# Patient Record
Sex: Male | Born: 1960
Health system: Southern US, Community
[De-identification: ages and names within clinical notes are randomized; demographics above are authoritative.]

## PROBLEM LIST (undated history)

## (undated) DIAGNOSIS — Z87442 Personal history of urinary calculi: Secondary | ICD-10-CM

## (undated) DIAGNOSIS — Z8619 Personal history of other infectious and parasitic diseases: Secondary | ICD-10-CM

## (undated) DIAGNOSIS — E785 Hyperlipidemia, unspecified: Secondary | ICD-10-CM

## (undated) DIAGNOSIS — I2510 Atherosclerotic heart disease of native coronary artery without angina pectoris: Secondary | ICD-10-CM

## (undated) DIAGNOSIS — I6523 Occlusion and stenosis of bilateral carotid arteries: Secondary | ICD-10-CM

## (undated) DIAGNOSIS — Z7289 Other problems related to lifestyle: Secondary | ICD-10-CM

## (undated) DIAGNOSIS — U071 COVID-19: Secondary | ICD-10-CM

## (undated) DIAGNOSIS — K760 Fatty (change of) liver, not elsewhere classified: Secondary | ICD-10-CM

## (undated) DIAGNOSIS — I5189 Other ill-defined heart diseases: Secondary | ICD-10-CM

## (undated) DIAGNOSIS — I340 Nonrheumatic mitral (valve) insufficiency: Secondary | ICD-10-CM

## (undated) DIAGNOSIS — M1990 Unspecified osteoarthritis, unspecified site: Secondary | ICD-10-CM

## (undated) DIAGNOSIS — R7303 Prediabetes: Secondary | ICD-10-CM

## (undated) DIAGNOSIS — I739 Peripheral vascular disease, unspecified: Secondary | ICD-10-CM

## (undated) DIAGNOSIS — K219 Gastro-esophageal reflux disease without esophagitis: Secondary | ICD-10-CM

## (undated) DIAGNOSIS — I8390 Asymptomatic varicose veins of unspecified lower extremity: Secondary | ICD-10-CM

## (undated) DIAGNOSIS — I071 Rheumatic tricuspid insufficiency: Secondary | ICD-10-CM

## (undated) DIAGNOSIS — E669 Obesity, unspecified: Secondary | ICD-10-CM

## (undated) DIAGNOSIS — M5412 Radiculopathy, cervical region: Secondary | ICD-10-CM

## (undated) DIAGNOSIS — I10 Essential (primary) hypertension: Secondary | ICD-10-CM

## (undated) DIAGNOSIS — F5104 Psychophysiologic insomnia: Secondary | ICD-10-CM

## (undated) DIAGNOSIS — Z789 Other specified health status: Secondary | ICD-10-CM

## (undated) HISTORY — DX: Other specified health status: Z78.9

## (undated) HISTORY — PX: SCLEROTHERAPY: SHX6841

## (undated) HISTORY — DX: Atherosclerotic heart disease of native coronary artery without angina pectoris: I25.10

## (undated) HISTORY — DX: Obesity, unspecified: E66.9

## (undated) HISTORY — PX: KNEE ARTHROSCOPY: SUR90

## (undated) HISTORY — DX: Hyperlipidemia, unspecified: E78.5

## (undated) HISTORY — DX: Gastro-esophageal reflux disease without esophagitis: K21.9

## (undated) HISTORY — DX: Personal history of other infectious and parasitic diseases: Z86.19

## (undated) HISTORY — DX: Essential (primary) hypertension: I10

## (undated) HISTORY — DX: Other problems related to lifestyle: Z72.89

---

## 2009-11-20 ENCOUNTER — Emergency Department: Payer: Self-pay | Admitting: Internal Medicine

## 2010-08-07 ENCOUNTER — Ambulatory Visit: Payer: Self-pay | Admitting: Family Medicine

## 2010-08-26 HISTORY — PX: COLONOSCOPY: SHX174

## 2010-08-26 HISTORY — PX: ANTERIOR CERVICAL DECOMP/DISCECTOMY FUSION: SHX1161

## 2011-08-27 HISTORY — PX: CARDIAC CATHETERIZATION: SHX172

## 2012-01-04 ENCOUNTER — Inpatient Hospital Stay: Payer: Self-pay | Admitting: Internal Medicine

## 2012-01-04 LAB — CK-MB: CK-MB: 0.6 ng/mL (ref 0.5–3.6)

## 2012-01-04 LAB — CBC
MCH: 32.7 pg (ref 26.0–34.0)
MCV: 91 fL (ref 80–100)
Platelet: 236 10*3/uL (ref 150–440)
RDW: 12.7 % (ref 11.5–14.5)
WBC: 5.8 10*3/uL (ref 3.8–10.6)

## 2012-01-04 LAB — BASIC METABOLIC PANEL
Chloride: 106 mmol/L (ref 98–107)
EGFR (African American): >60
EGFR (Non-African Amer.): >60
Sodium: 138 mmol/L (ref 136–145)

## 2012-01-04 LAB — TROPONIN I: Troponin-I: <0.02 ng/mL

## 2012-01-05 LAB — CK-MB: CK-MB: 0.6 ng/mL (ref 0.5–3.6)

## 2012-01-05 LAB — BASIC METABOLIC PANEL
Calcium, Total: 8.3 mg/dL — ABNORMAL LOW (ref 8.5–10.1)
Co2: 27 mmol/L (ref 21–32)
Creatinine: 0.58 mg/dL — ABNORMAL LOW (ref 0.60–1.30)
EGFR (African American): >60
Glucose: 86 mg/dL (ref 65–99)
Potassium: 3.7 mmol/L (ref 3.5–5.1)

## 2012-01-05 LAB — LIPID PANEL
Cholesterol: 206 mg/dL — ABNORMAL HIGH (ref 0–200)
Triglycerides: 258 mg/dL — ABNORMAL HIGH (ref 0–200)
VLDL Cholesterol, Calc: 52 mg/dL — ABNORMAL HIGH (ref 5–40)

## 2012-01-05 LAB — HEMOGLOBIN A1C: Hemoglobin A1C: 5.8 % (ref 4.2–6.3)

## 2012-01-06 LAB — BASIC METABOLIC PANEL
BUN: 11 mg/dL (ref 7–18)
Chloride: 107 mmol/L (ref 98–107)
Co2: 27 mmol/L (ref 21–32)
Creatinine: 0.63 mg/dL (ref 0.60–1.30)
Glucose: 94 mg/dL (ref 65–99)
Potassium: 4.2 mmol/L (ref 3.5–5.1)
Sodium: 140 mmol/L (ref 136–145)

## 2012-01-07 ENCOUNTER — Emergency Department: Payer: Self-pay | Admitting: Emergency Medicine

## 2012-12-28 ENCOUNTER — Other Ambulatory Visit: Payer: Self-pay | Admitting: Neurosurgery

## 2012-12-28 DIAGNOSIS — M542 Cervicalgia: Secondary | ICD-10-CM

## 2012-12-28 DIAGNOSIS — M5412 Radiculopathy, cervical region: Secondary | ICD-10-CM

## 2013-01-01 ENCOUNTER — Ambulatory Visit
Admission: RE | Admit: 2013-01-01 | Discharge: 2013-01-01 | Disposition: A | Discharge status: Home / Self Care | Payer: 59 | Source: Ambulatory Visit | Attending: Neurosurgery | Admitting: Neurosurgery

## 2013-01-01 DIAGNOSIS — M5412 Radiculopathy, cervical region: Secondary | ICD-10-CM

## 2013-01-01 DIAGNOSIS — M542 Cervicalgia: Secondary | ICD-10-CM

## 2013-01-13 ENCOUNTER — Other Ambulatory Visit: Payer: Self-pay

## 2013-02-25 ENCOUNTER — Other Ambulatory Visit: Payer: Self-pay | Admitting: Neurosurgery

## 2013-02-25 DIAGNOSIS — M5412 Radiculopathy, cervical region: Secondary | ICD-10-CM

## 2013-03-04 ENCOUNTER — Ambulatory Visit
Admission: RE | Admit: 2013-03-04 | Discharge: 2013-03-04 | Disposition: A | Discharge status: Home / Self Care | Payer: 59 | Source: Ambulatory Visit | Attending: Neurosurgery | Admitting: Neurosurgery

## 2013-03-04 VITALS — BP 150/80 | HR 64 | Ht 66.0 in | Wt 250.0 lb

## 2013-03-04 DIAGNOSIS — M5412 Radiculopathy, cervical region: Secondary | ICD-10-CM

## 2013-03-04 MED ORDER — ONDANSETRON HCL 4 MG/2ML IJ SOLN
4.0000 mg | Freq: Once | INTRAMUSCULAR | Status: AC
  Administered 2013-03-04: 4 mg via INTRAMUSCULAR

## 2013-03-04 MED ORDER — MEPERIDINE HCL 100 MG/ML IJ SOLN
100.0000 mg | Freq: Once | INTRAMUSCULAR | Status: AC
  Administered 2013-03-04: 100 mg via INTRAMUSCULAR

## 2013-03-04 MED ORDER — IOHEXOL 300 MG/ML  SOLN
10.0000 mL | Freq: Once | INTRAMUSCULAR | Status: AC | PRN
  Administered 2013-03-04: 10 mL via INTRATHECAL

## 2013-03-04 MED ORDER — DIAZEPAM 5 MG PO TABS
10.0000 mg | ORAL_TABLET | Freq: Once | ORAL | Status: AC
  Administered 2013-03-04: 10 mg via ORAL

## 2013-03-04 NOTE — Progress Notes (Signed)
Explained discharge instructions to pt.  

## 2013-08-26 HISTORY — PX: CT CTA CORONARY W/CA SCORE W/CM &/OR WO/CM: HXRAD787

## 2013-11-29 ENCOUNTER — Emergency Department: Payer: Self-pay | Admitting: Emergency Medicine

## 2013-11-29 LAB — BASIC METABOLIC PANEL
Anion Gap: 5 — ABNORMAL LOW (ref 7–16)
BUN: 11 mg/dL (ref 7–18)
CALCIUM: 8.5 mg/dL (ref 8.5–10.1)
CHLORIDE: 103 mmol/L (ref 98–107)
CO2: 27 mmol/L (ref 21–32)
Creatinine: 0.91 mg/dL (ref 0.60–1.30)
EGFR (African American): >60
Glucose: 132 mg/dL — ABNORMAL HIGH (ref 65–99)
Osmolality: 271 (ref 275–301)
POTASSIUM: 3.6 mmol/L (ref 3.5–5.1)
SODIUM: 135 mmol/L — AB (ref 136–145)

## 2013-11-29 LAB — D-DIMER(ARMC): D-DIMER: 210 ng/mL

## 2013-11-29 LAB — CBC
HCT: 44.1 % (ref 40.0–52.0)
HGB: 15.2 g/dL (ref 13.0–18.0)
MCH: 31.9 pg (ref 26.0–34.0)
MCHC: 34.4 g/dL (ref 32.0–36.0)
MCV: 93 fL (ref 80–100)
PLATELETS: 230 10*3/uL (ref 150–440)
RBC: 4.76 10*6/uL (ref 4.40–5.90)
RDW: 12.8 % (ref 11.5–14.5)
WBC: 6.4 10*3/uL (ref 3.8–10.6)

## 2013-11-29 LAB — TROPONIN I

## 2013-11-30 LAB — CBC
HEMOGLOBIN: 15.1 g/dL
PLATELET COUNT: 13.3
WBC: 7.1

## 2013-11-30 LAB — BILIRUBIN, TOTAL: CREATININE: 0.75

## 2014-03-18 ENCOUNTER — Ambulatory Visit: Payer: Self-pay | Admitting: Specialist

## 2014-05-13 ENCOUNTER — Ambulatory Visit: Payer: Self-pay | Admitting: Gastroenterology

## 2014-05-15 ENCOUNTER — Other Ambulatory Visit: Payer: Self-pay | Admitting: Gastroenterology

## 2014-05-15 LAB — CLOSTRIDIUM DIFFICILE(ARMC)

## 2014-05-16 LAB — PATHOLOGY REPORT

## 2014-05-17 LAB — STOOL CULTURE

## 2014-10-07 LAB — HEPATIC FUNCTION PANEL
ALBUMIN: 4.5
ALT: 50
AST: 26 U/L
Alkaline Phosphatase: 112 U/L
BILIRUBIN TOTAL: 0.9 mg/dL

## 2014-10-07 LAB — LIPID PANEL
CHOLESTEROL: 168
HDL: 40
LDL (calc): 102
TRIGLYCERIDES: 128

## 2014-12-18 NOTE — H&P (Signed)
PATIENT NAME:  [name] [name], [name] [name] MR#:  [id number] DATE OF BIRTH:  [birth year]  DATE OF ADMISSION:  01/04/2012  PRIMARY CARE PHYSICIAN: None.   CHIEF COMPLAINT: Chest pain.   HISTORY OF PRESENT ILLNESS: The patient is a 54 year old white male with history of reflux who states that he has been having chest pain ongoing for the past one week. He states that the pain is in the substernal area of his chest. He describes it as a constant type of pressure-like sensation associated with some intermittent burning. The patient reports that activity does make it a little worse. He also has some dyspnea on exertion. He denies any numbness or tingling. He denies any nausea or vomiting. He reports that he has not had any similar symptoms like these previously. He does have a history of reflux and he reports that he usually takes over-the-counter Prilosec and it usually helps.   PAST MEDICAL HISTORY: Gastroesophageal reflux disease.   PAST SURGICAL HISTORY: Status post C5-C6 neck surgery.   ALLERGIES: None.   MEDICATIONS: Prilosec 20 daily.   SOCIAL HISTORY: Does not smoke. Drinks occasionally. No drugs.   FAMILY HISTORY: Father with a heart attack.  REVIEW OF SYSTEMS:  CONSTITUTIONAL: Denies any fevers, fatigue, or weakness. Chest pain as above. No weight loss. No weight gain. EYES: No blurred or double vision. No pain. No redness. No inflammation. No glaucoma. No cataracts. ENT: No tinnitus. No ear pain. No hearing loss. No seasonal or year-round allergies. No epistaxis. No nasal discharge. No snoring. No postnasal drip. No sinus pain. No redness of the oropharynx. No difficulty swallowing.  RESPIRATORY: No cough. No wheezing. No hemoptysis. No chronic obstructive pulmonary disease. No tuberculosis.  CARDIOVASCULAR: Complains of chest pressure as above. No orthopnea. No edema. No arrhythmia. Complains of dyspnea on exertion and does complain of feeling lightheaded and dizzy but no syncope. GASTROINTESTINAL: No  nausea, vomiting, or diarrhea. No abdominal pain. No hematemesis. No melena. No ulcer. Does have history of gastroesophageal reflux disease. No irritable bowel syndrome. No jaundice. No rectal bleeding. No changes in bowel habits. GENITOURINARY: Denies any dysuria, hematuria, renal calculus, or frequency. ENDO: Denies any polyuria, nocturia, or thyroid problems. HEME/LYMPH: Denies anemia, easy bruisability, or bleeding. SKIN: No acne. No rash. No changes mole, hair, or skin. MUSCULOSKELETAL: Denies any pain in the neck, back, or shoulder. NEURO: No numbness. No cerebrovascular accident. No transient ischemic attack. No seizures. PSYCHIATRIC: No anxiety. No insomnia. No ADD. No OCD.   PHYSICAL EXAMINATION:  VITAL SIGNS: Temperature 96, pulse 78, respirations 18, blood pressure 206/107 on presentation, O2 100%.   GENERAL: The patient is a 54 year old white male in no acute distress.   HEENT: Head atraumatic, normocephalic. Pupils equally round, reactive to light and accommodation. Extraocular movements intact.  Oropharynx clear.  NECK: There is no JVD. No carotid bruits.   CARDIOVASCULAR: Regular rate and rhythm. No murmurs, rubs, clicks, or gallops. PMI is not displaced.   LUNGS: No accessory muscle usage. Clear to auscultation bilaterally without any rales, rhonchi, or wheezing.   ABDOMEN: Soft, nontender, nondistended. Positive bowel sounds times four.   EXTREMITIES: No clubbing, cyanosis, or edema.   SKIN: No rash.   LYMPHATICS: No lymph nodes palpable.   VASCULAR: Good DP, PT pulses.   PSYCHIATRIC: Not anxious or depressed.   NEUROLOGICAL: Awake, alert, oriented times three. No focal deficits.   LABORATORY, DIAGNOSTIC, AND RADIOLOGICAL DATA: Glucose 104, BUN 9, creatinine 0.68, sodium 138, potassium 3.6, chloride 106, CO2 25, calcium 8.5. Troponin less  than 0.02. WBC 5.8, hemoglobin 13.8, platelet count was 236. EKG showed right bundle branch block. No ST-T wave changes. Chest x-ray  showed no acute abnormality.   ASSESSMENT AND PLAN: The patient is a 54 year old white male with history of reflux who presents with ongoing substernal chest discomfort going on for one week, constant in nature. Noted to have elevated blood pressure on presentation.  1. Chest pain, possibly due to accelerated blood pressure. Due to the persistent nature of his symptoms we will go ahead and do serial cardiac enzymes. Perform a stress MIBI in the a.m. We will control his blood pressure. Also place him on Protonix b.i.d. if this could be also reflux related.  2. Accelerated blood pressure with no history of hypertension. We will go ahead and start him on metoprolol and p.r.n. hydralazine. Adjust his regimen as needed.  3. Gastroesophageal reflux disease. We will place him on Protonix b.i.d.  4. Miscellaneous. We will place him on Lovenox for deep vein thrombosis prophylaxis.   TIME SPENT: 35 minutes spent.     ____________________________ Lafonda Mosses. Posey Pronto, MD shp:bjt D: 01/04/2012 12:38:57 ET T: 01/04/2012 13:27:36 ET JOB#: [id number]  cc: Marwa Fuhrman H. Posey Pronto, MD, <Dictator> Alric Seton MD ELECTRONICALLY SIGNED 01/06/2012 14:34

## 2014-12-18 NOTE — Discharge Summary (Signed)
PATIENT NAME:  [name] [name] MR#:  [id number] DATE OF BIRTH:  19-Dec-1960  DATE OF ADMISSION:  01/04/2012 DATE OF DISCHARGE:  01/06/2012  ADMISSION DIAGNOSIS: Chest pain.   DISCHARGE DIAGNOSES:  1. Chest pain, noncardiac, status post cardiac catheterization with minor irregularites, possible gastrointestinal related.  2. Chronic gastroesophageal reflux disease over greater than 20 years. Will need outpatient evaluation and possible esophagogastroduodenoscopy for evaluation of Barrett's.  3. Hyperlipidemia with low HDL and hypertriglyceridemia. The patient was counseled regarding diet. Will need repeat fasting lipid panel in a few weeks and may need treatment.  4. Status post cervical spine surgery.  5. Accelerated hypertension.  CONSULTANT: Dr. [name].  PERTINENT LABS AND EVALUATIONS: Glucose 104, BUN 9, creatinine 0.68, sodium 138, potassium 3.6, chloride 106, CO2 25, calcium 8.5. Troponin less than 0.02 x3. WBC 5.8, hemoglobin 13.8, platelet count 236.   EKG showed right bundle branch block. No ST-T wave changes.   Chest x-ray showed no acute abnormality.   Fasting lipid panel: Total cholesterol 206, triglycerides 258, HDL 24. Hemoglobin A1c 5.8.   Cardiac catheterization showed no obstruction, official report pending.   HOSPITAL COURSE: Please see the history and physical done by me. The patient is a 54 year old white male with history of chronic reflux who presented with constant ongoing chest pain for about one week in duration. The patient also reports there is a strong family history of heart disease, especially in his father. Due to his symptoms, the patient was hospitalized. Serial cardiac enzymes were done. Cardiology evaluation was done due to the consistent nature of his symptoms. Due to the constant nature of his symptoms, it was felt that cardiac catheterization would be the best appropriate evaluation at this point. He underwent a cardiac catheterization which showed no obstructive  disease minimal cad. His symptoms could be related to GI causes. At this point, his chest pain has resolved. I recommended that he be followed up as an outpatient with gastroenterology for any further evaluations. At this time, he is stable for discharge.   DISCHARGE MEDICATIONS:  1. Prilosec 20 mg daily. 2. Atenolol 50 mg p.o. daily.   DIET: Low fat, low sodium.   ACTIVITY: As tolerated.   DISCHARGE FOLLOWUP: Followup in 1 to 2 weeks with primary MD of choice. Also referred to [facility] as a new patient for reflux. May need an EGD.   TIME SPENT: 32 minutes. ____________________________ Lafonda Mosses Posey Pronto, MD shp:slb D: 01/06/2012 11:47:24 ET T: 01/07/2012 12:31:13 ET JOB#: [id number]  cc: Kiley Solimine H. Posey Pronto, MD, <Dictator> Alric Seton MD ELECTRONICALLY SIGNED 01/09/2012 17:38

## 2014-12-18 NOTE — Consult Note (Signed)
PATIENT NAME:  [name] [name], [name] [name] MR#:  [id number] DATE OF BIRTH:  04-17-1961  DATE OF CONSULTATION:  01/05/2012  REFERRING PHYSICIAN:   CONSULTING PHYSICIAN:  Dionisio David, MD  INDICATION FOR CONSULTATION: Chest pain.   HISTORY OF PRESENT ILLNESS: This is a 54 year old white male with a past medical history of GI reflux who came into the hospital with chest pain. The chest pain was described as pressure-type associated with shortness of breath and diaphoresis and dizziness. He says he has normally some chest pain and he takes antacids for acid reflux, but this time the pain was much worse and different in character. He said this was not the pain that he gets from acid reflux; usually that is a burning-type pain.   PAST MEDICAL HISTORY: History of acid reflux.   ALLERGIES: None.   MEDICATIONS:  Prilosec 20 mg p.o. daily.   SOCIAL HISTORY: Unremarkable.   FAMILY HISTORY: His father had a heart attack at the age of 14.   PHYSICAL EXAMINATION:  GENERAL: He is alert, oriented times three, in no acute distress.   VITAL SIGNS:  Stable. Right now 140/70, but when he first came in his blood pressure was remarkably elevated at 206/107.   NECK: No JVD.   LUNGS: Clear.   HEART: Regular rate and rhythm. Normal S1, S2. No murmur.   ABDOMEN: Soft, nontender, positive bowel sounds.   EXTREMITIES: No pedal edema.   NEUROLOGIC: The patient appears to be intact.   EKG shows normal sinus rhythm, nonspecific ST-T changes, incomplete right bundle branch block. Cardiac enzymes are negative.   ASSESSMENT AND PLAN: Unstable angina with history of hypertension, hypercholesterolemia, history of having cholesterol as high as 300. His father had a massive myocardial infarction at his age, so he has multiple risk factors for coronary artery disease. Advise cardiac catheterization.   ____________________________ Dionisio David, MD sak:bjt D: 01/05/2012 09:51:15 ET T: 01/05/2012 10:23:13  ET JOB#: [id number]  cc: Dionisio David, MD, <Dictator> Dionisio David MD ELECTRONICALLY SIGNED 01/14/2012 11:03

## 2014-12-30 IMAGING — US ABDOMEN ULTRASOUND LIMITED
1 series · 14 of 25 positions shown · non-contrast
Comparison: None.

CLINICAL DATA: Hypercholesterolemia. Obesity. Preoperative
assessment.

EXAM:
US ABDOMEN LIMITED - RIGHT UPPER QUADRANT

[Series 1: abdomen ultrasound limited · 0.28mm/px · 14 of 44 slices shown]
[im 1/44]
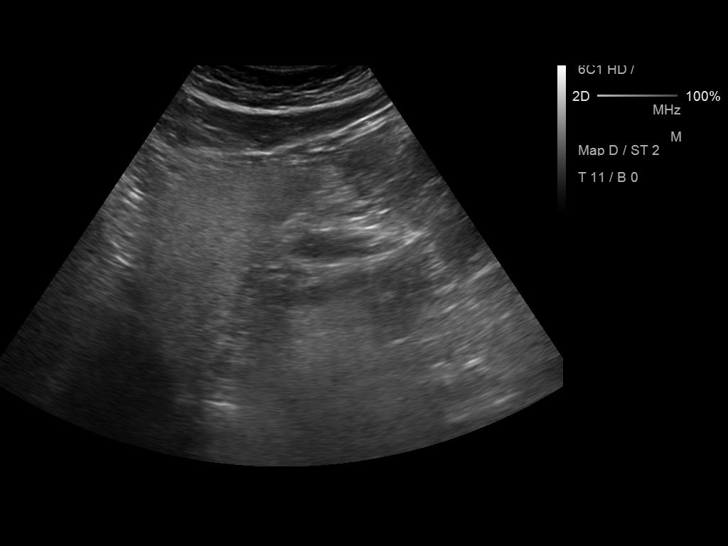
[im 4/44]
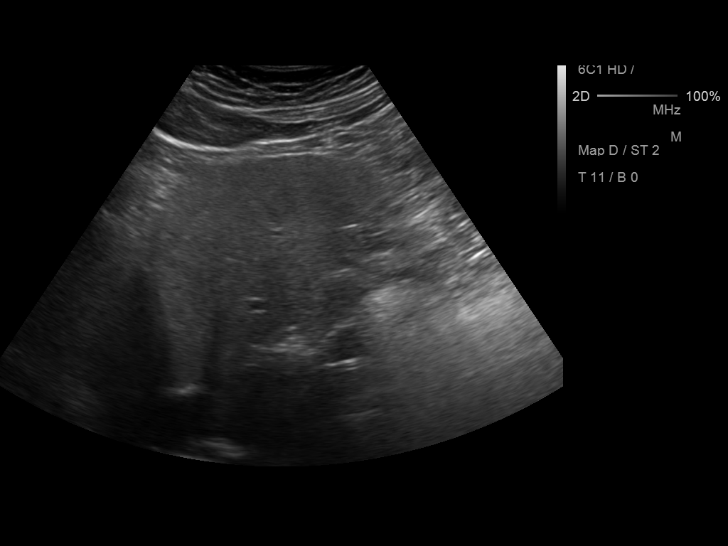
[im 8/44]
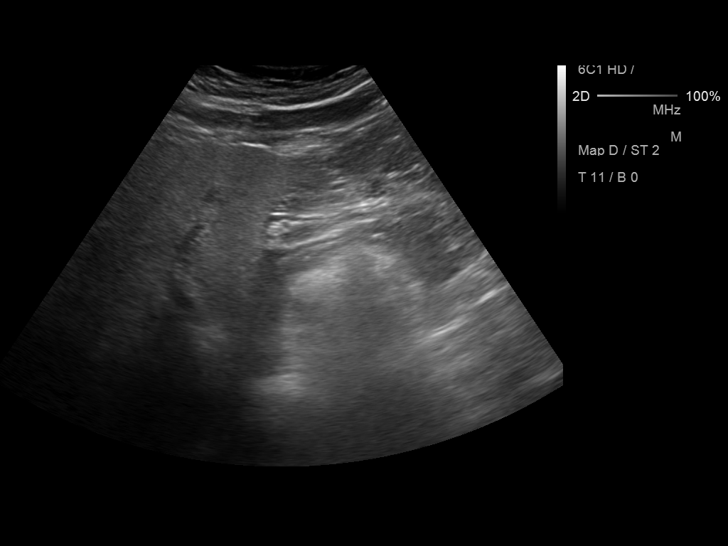
[im 11/44]
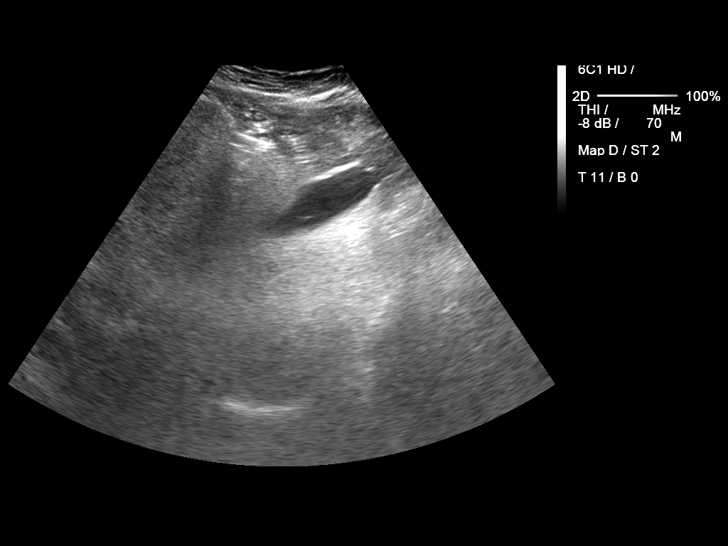
[im 15/44]
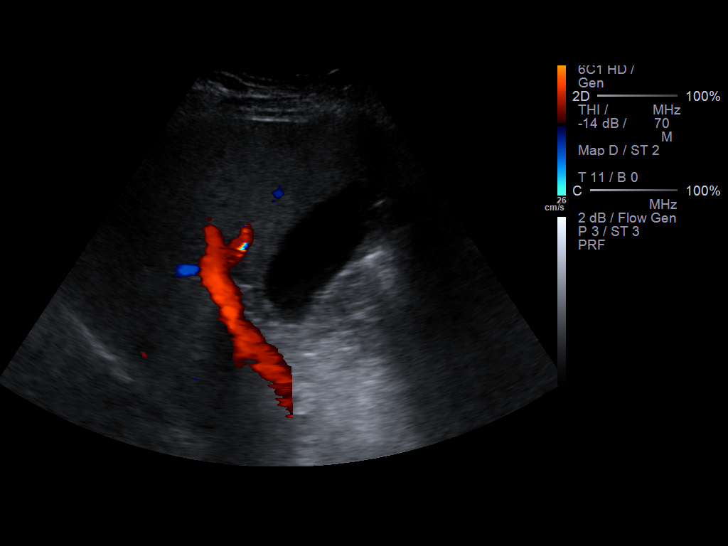
[im 17/44]
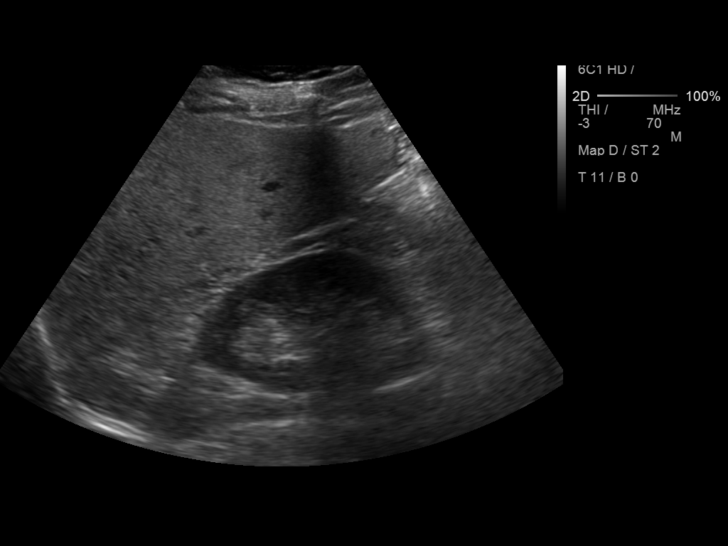
[im 20/44]
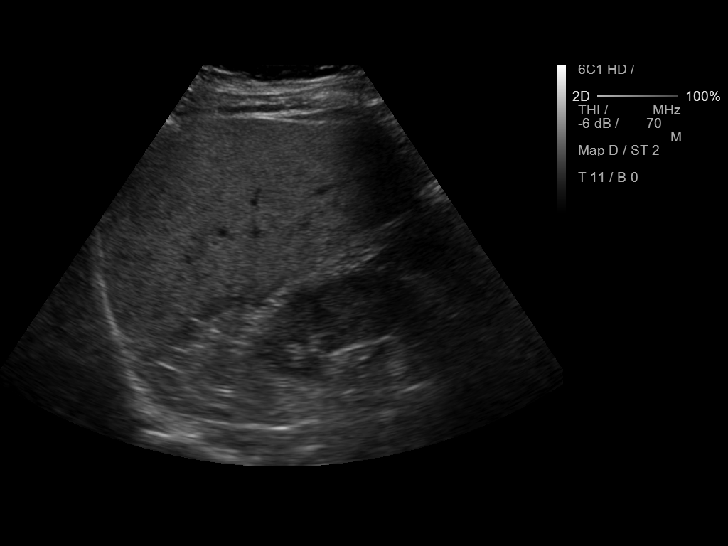
[im 24/44]
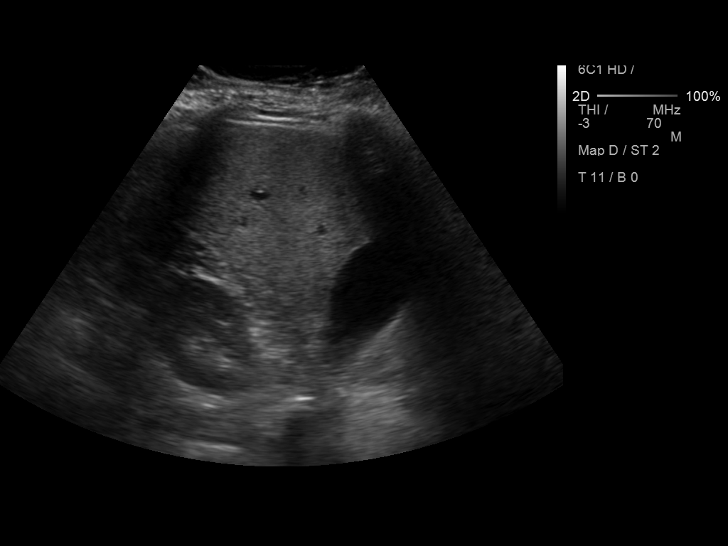
[im 27/44]
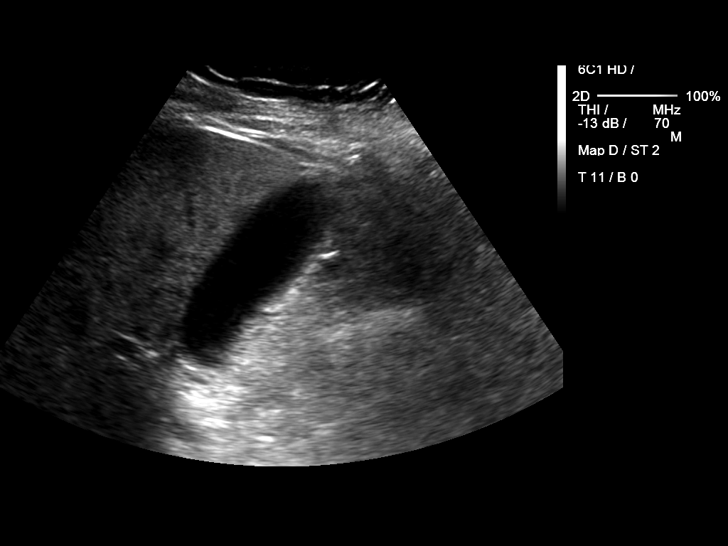
[im 29/44]
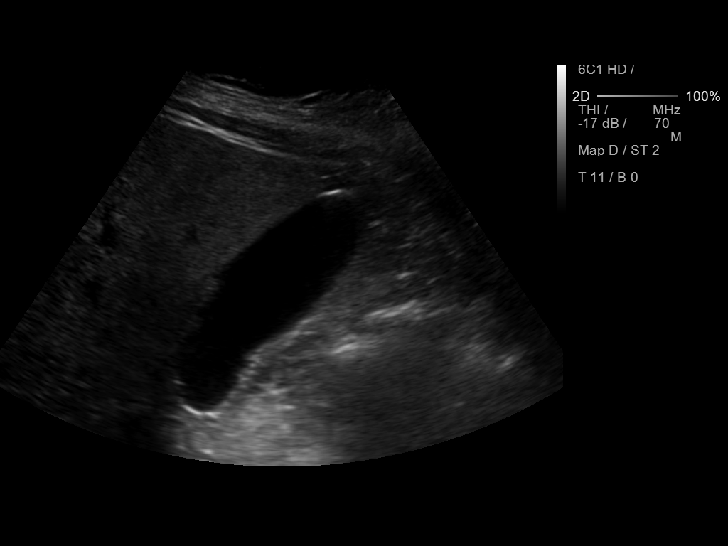
[im 33/44]
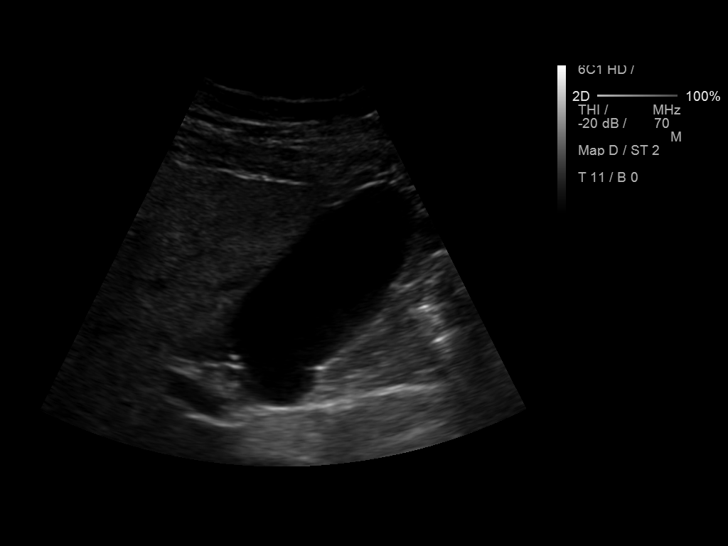
[im 36/44]
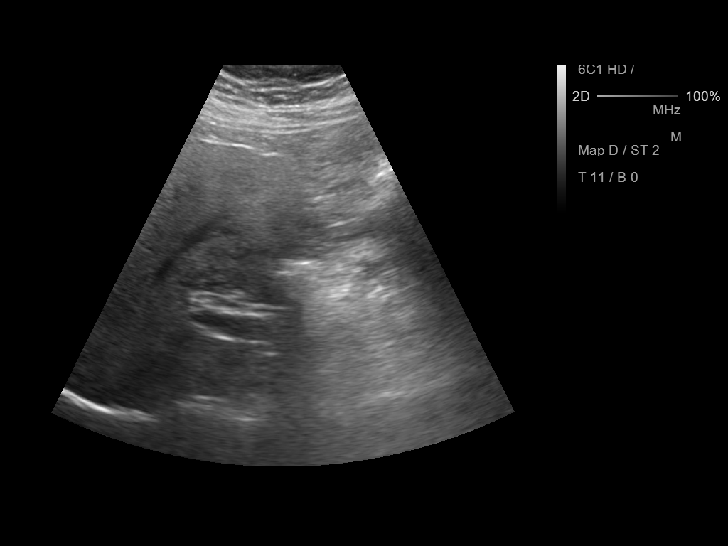
[im 40/44]
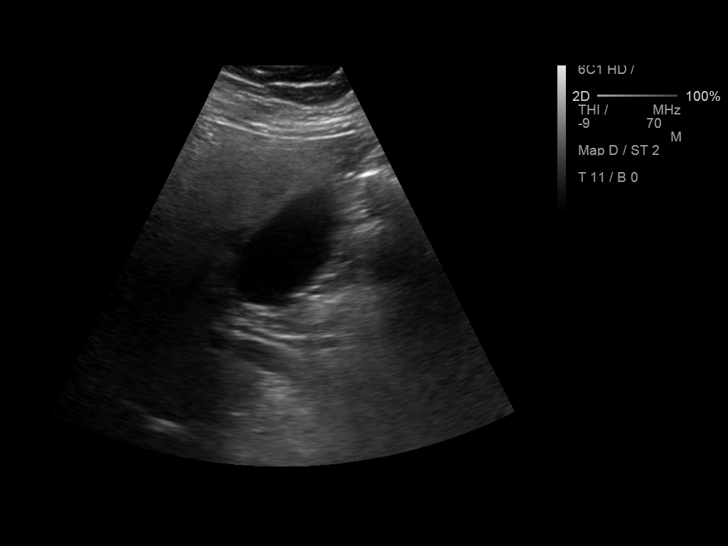
[im 44/44]
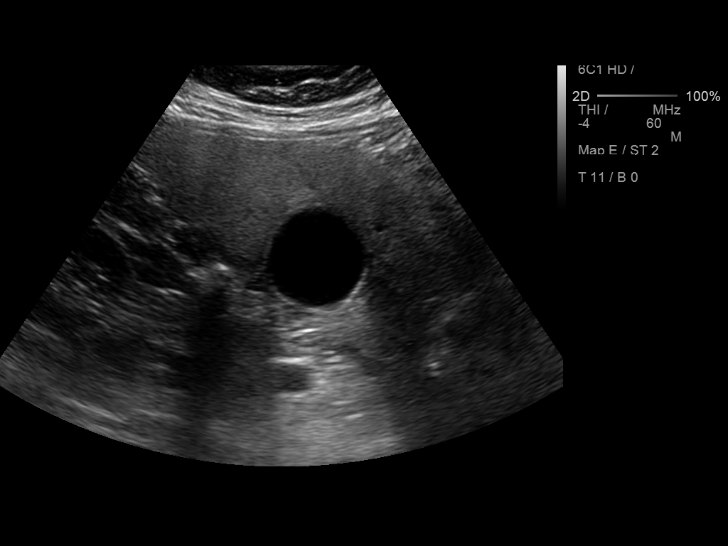

[14 of 25 positions shown; findings below may reference images not displayed]

FINDINGS: Gallbladder:

No gallstones or wall thickening visualized. No sonographic Murphy
sign noted.

Common bile duct:

Diameter: 5 mm, within normal limits for age.

Liver:

Mildly generally hyperechoic with reduced sonic penetration. No
focal lesion.
IMPRESSION: 1. Mildly diffusely hyperechoic liver, suspicious for hepatic
steatosis. Otherwise, no significant abnormalities are observed.

## 2014-12-30 IMAGING — CR DG CHEST 2V
1 series · 2 of 2 positions shown · non-contrast
Comparison: 11/29/2013.

CLINICAL DATA: Preoperative chest radiograph. Bariatric surgery.
Morbid obesity.

EXAM:
CHEST  2 VIEW

[Series 1: w chest pa · 0.14mm/px · 2 of 2 slices shown]
[im 1/2]
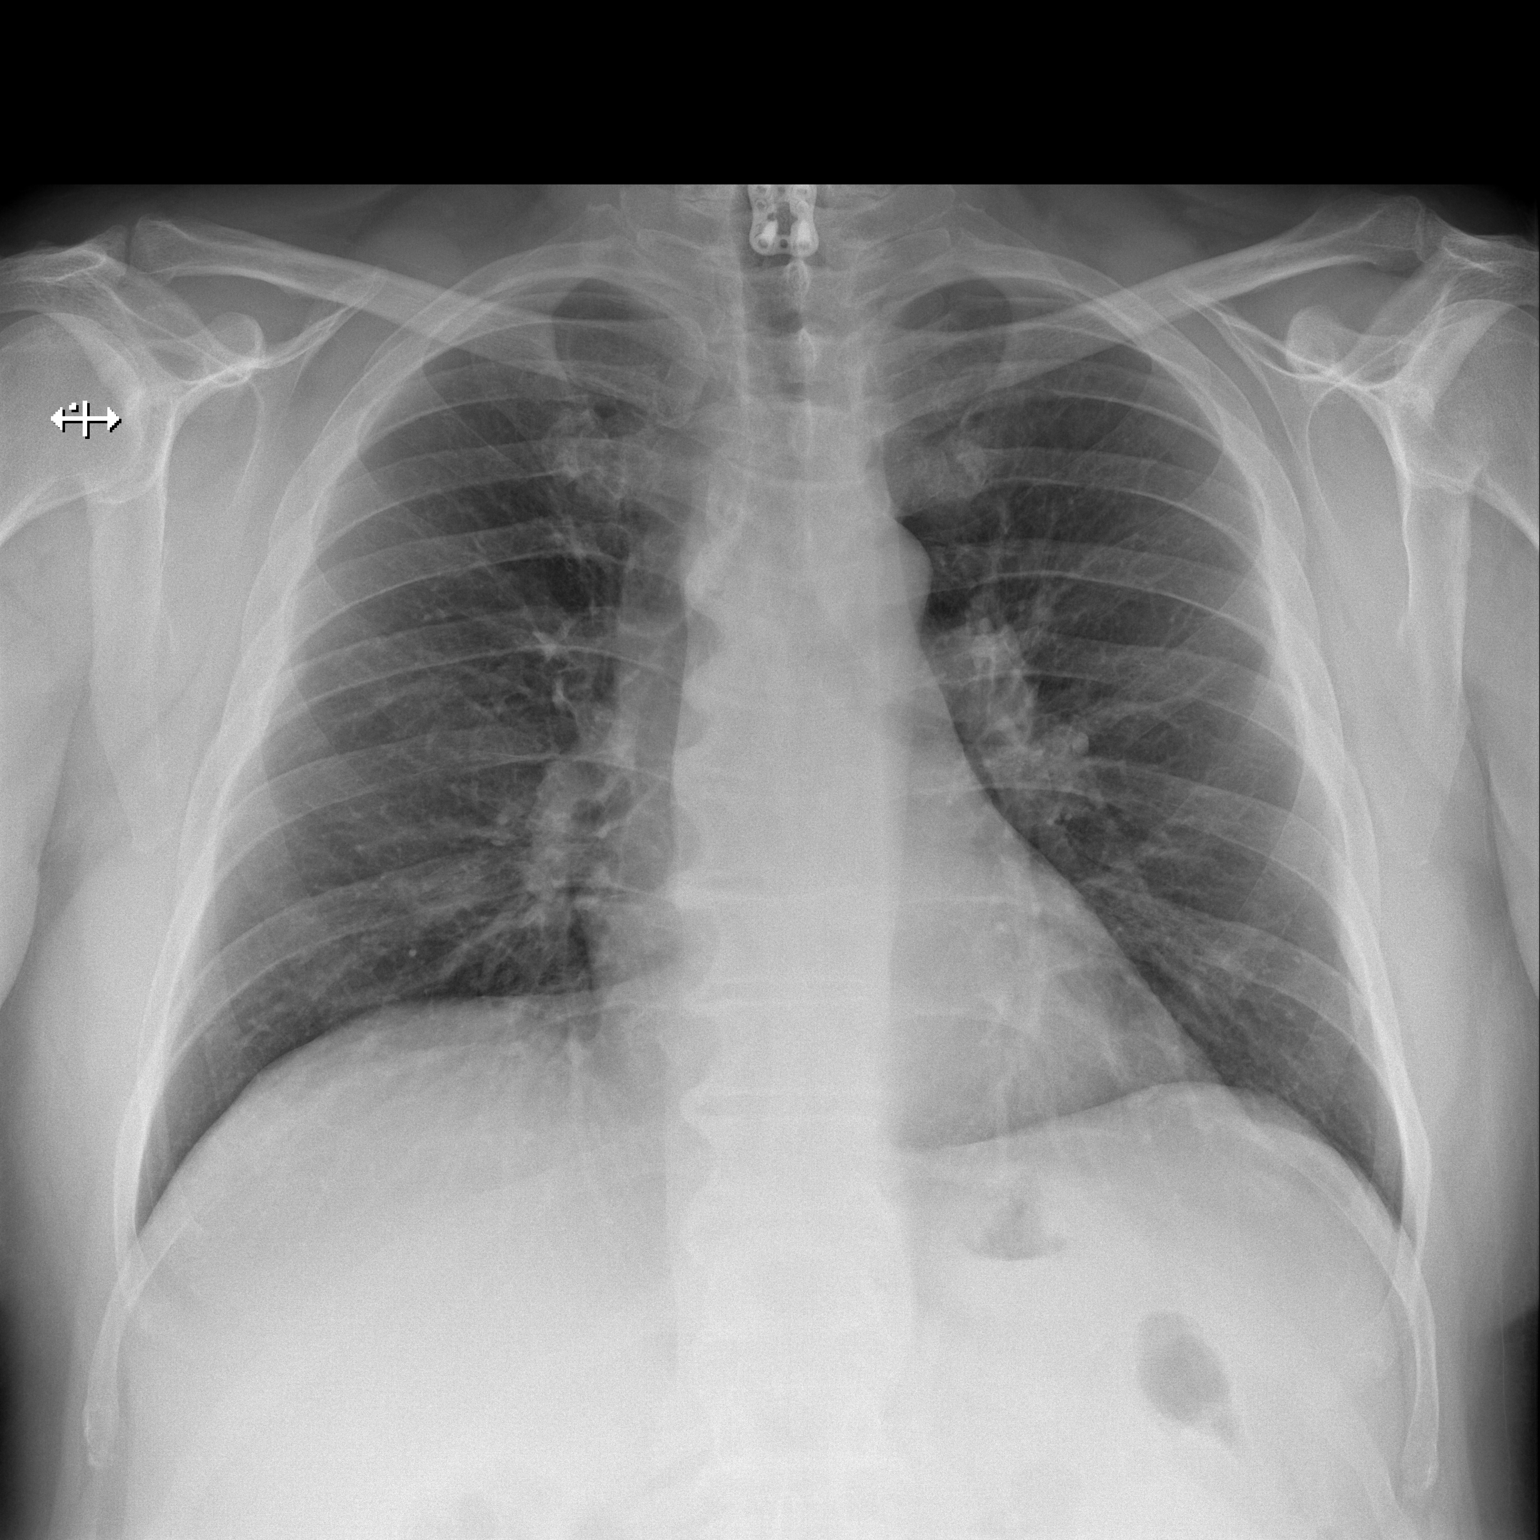
[im 2/2]
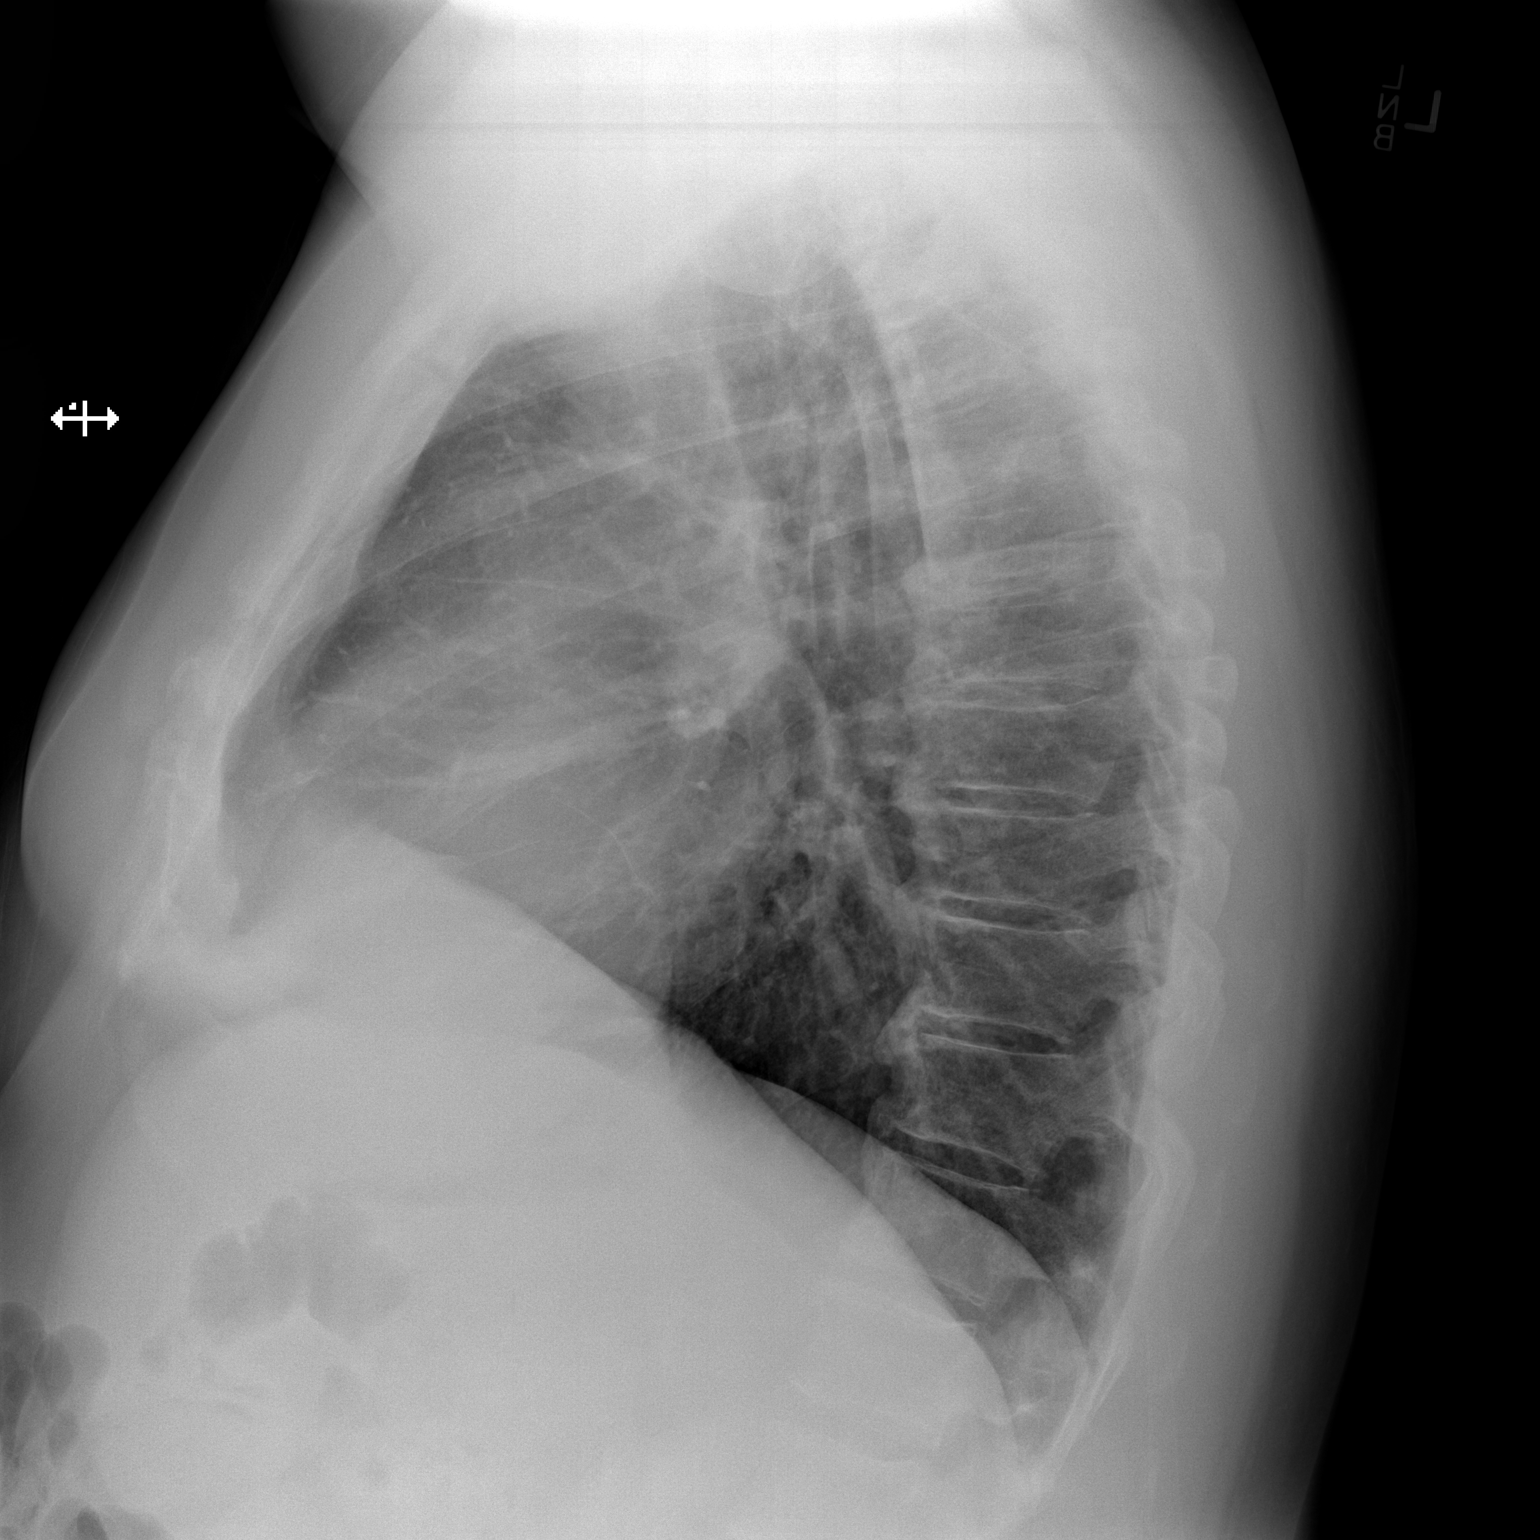

[2 of 2 positions shown; findings below may reference images not displayed]

FINDINGS: Cardiopericardial silhouette within normal limits. Mediastinal
contours normal. Trachea midline. No airspace disease or effusion.
Lower cervical ACDF is present.
IMPRESSION: No active cardiopulmonary disease.

## 2016-01-13 ENCOUNTER — Encounter: Payer: Self-pay | Admitting: Family Medicine

## 2016-01-13 DIAGNOSIS — M7541 Impingement syndrome of right shoulder: Secondary | ICD-10-CM | POA: Insufficient documentation

## 2016-01-14 ENCOUNTER — Encounter: Payer: Self-pay | Admitting: Family Medicine

## 2016-01-14 DIAGNOSIS — K219 Gastro-esophageal reflux disease without esophagitis: Secondary | ICD-10-CM | POA: Insufficient documentation

## 2016-01-19 ENCOUNTER — Ambulatory Visit (INDEPENDENT_AMBULATORY_CARE_PROVIDER_SITE_OTHER): Payer: 59 | Admitting: Family Medicine

## 2016-01-19 ENCOUNTER — Encounter: Payer: Self-pay | Admitting: Family Medicine

## 2016-01-19 VITALS — BP 122/84 | HR 76 | Temp 97.9°F | Ht 68.0 in | Wt 229.5 lb

## 2016-01-19 DIAGNOSIS — K219 Gastro-esophageal reflux disease without esophagitis: Secondary | ICD-10-CM

## 2016-01-19 DIAGNOSIS — E66811 Obesity, class 1: Secondary | ICD-10-CM

## 2016-01-19 DIAGNOSIS — I8393 Asymptomatic varicose veins of bilateral lower extremities: Secondary | ICD-10-CM

## 2016-01-19 DIAGNOSIS — I2510 Atherosclerotic heart disease of native coronary artery without angina pectoris: Secondary | ICD-10-CM

## 2016-01-19 DIAGNOSIS — E669 Obesity, unspecified: Secondary | ICD-10-CM

## 2016-01-19 DIAGNOSIS — E785 Hyperlipidemia, unspecified: Secondary | ICD-10-CM

## 2016-01-19 DIAGNOSIS — I10 Essential (primary) hypertension: Secondary | ICD-10-CM | POA: Diagnosis not present

## 2016-01-19 DIAGNOSIS — I83813 Varicose veins of bilateral lower extremities with pain: Secondary | ICD-10-CM | POA: Insufficient documentation

## 2016-01-19 HISTORY — DX: Obesity, unspecified: E66.9

## 2016-01-19 HISTORY — DX: Obesity, class 1: E66.811

## 2016-01-19 LAB — COMPREHENSIVE METABOLIC PANEL
ALT: 40 U/L (ref 0–53)
AST: 20 U/L (ref 0–37)
Albumin: 4.4 g/dL (ref 3.5–5.2)
Alkaline Phosphatase: 77 U/L (ref 39–117)
BILIRUBIN TOTAL: 0.8 mg/dL (ref 0.2–1.2)
BUN: 14 mg/dL (ref 6–23)
CHLORIDE: 99 meq/L (ref 96–112)
CO2: 32 meq/L (ref 19–32)
CREATININE: 0.82 mg/dL (ref 0.40–1.50)
Calcium: 9.8 mg/dL (ref 8.4–10.5)
GFR: 104.21 mL/min (ref >60.00)
GLUCOSE: 96 mg/dL (ref 70–99)
Potassium: 5 mEq/L (ref 3.5–5.1)
SODIUM: 137 meq/L (ref 135–145)
Total Protein: 6.9 g/dL (ref 6.0–8.3)

## 2016-01-19 LAB — LIPID PANEL
CHOL/HDL RATIO: 7
CHOLESTEROL: 263 mg/dL — AB (ref 0–200)
HDL: 40.4 mg/dL (ref >39.00)
LDL Cholesterol: 206 mg/dL — ABNORMAL HIGH (ref 0–99)
NONHDL: 222.8
TRIGLYCERIDES: 86 mg/dL (ref 0.0–149.0)
VLDL: 17.2 mg/dL (ref 0.0–40.0)

## 2016-01-19 LAB — TSH: TSH: 1.44 u[IU]/mL (ref 0.35–4.50)

## 2016-01-19 MED ORDER — CHLORTHALIDONE 25 MG PO TABS: 25.0000 mg | ORAL_TABLET | Freq: Every day | ORAL | Status: DC

## 2016-01-19 MED ORDER — ATORVASTATIN CALCIUM 20 MG PO TABS: 20.0000 mg | ORAL_TABLET | Freq: Every day | ORAL | Status: DC

## 2016-01-19 MED ORDER — OMEPRAZOLE 20 MG PO CPDR
20.0000 mg | DELAYED_RELEASE_CAPSULE | Freq: Every day | ORAL | Status: DC
Start: 2016-01-19 — End: 2017-02-05

## 2016-01-19 NOTE — Progress Notes (Signed)
BP 122/84 mmHg  Pulse 76  Temp(Src) 97.9 F (36.6 C) (Oral)  Ht 5\' 8"  (1.727 m)  Wt 229 lb 8 oz (104.101 kg)  BMI 34.90 kg/m2   CC: new pt to establish care  Subjective:    Patient ID: [name] [name], male    DOB: [birth year], 55 y.o.   MRN: [id number]  HPI: [name] [name] is a 55 y.o. male presenting on 01/19/2016 for Establish Care   Fasting today.   Varicose veins of legs - ongoing trouble for 5-6 yrs. No significant pain/soreness. Already using compression stockings. Veins enlarging.   HTN - Compliant with current antihypertensive regimen of chlorthalidone 25mg  daily. Does not check blood pressures at home. No low blood pressure readings or symptoms of dizziness/syncope. Denies HA, vision changes, CP/tightness, SOB, leg swelling.   HLD - compliant with statin without myalgias.  GERD - longstanding on omeprazole 20mg  daily. H/o EGD remotely.   Obesity - no regular exercise.  Alcohol - drinks 3 beers daily consistently. No hangover.   Ongoing shoulder/neck pain and some numbness. Followed by Dr Sabra Heck ortho. Pending shoulder injection.   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 Interim medical history since our last visit reviewed. Allergies and medications reviewed and updated. No current outpatient prescriptions on file prior to visit.   No current facility-administered medications on file prior to visit.    Review of Systems Per HPI unless specifically indicated in ROS section     Objective:    BP 122/84 mmHg  Pulse 76  Temp(Src) 97.9 F (36.6 C) (Oral)  Ht 5\' 8"  (1.727 m)  Wt 229 lb 8 oz (104.101 kg)  BMI 34.90 kg/m2  Wt Readings from Last 3 Encounters:  01/19/16 229 lb 8 oz (104.101 kg)  03/04/13 250 lb (113.399 kg)   Body mass index is 34.9 kg/(m^2).  Physical Exam  Constitutional: He  appears well-developed and well-nourished. No distress.  HENT:  Mouth/Throat: Oropharynx is clear and moist. No oropharyngeal exudate.  Eyes: Conjunctivae and EOM are normal. Pupils are equal, round, and reactive to light.  Neck: Normal range of motion. Neck supple. No thyromegaly present.  Cardiovascular: Normal rate, regular rhythm, normal heart sounds and intact distal pulses.   No murmur heard. Pulmonary/Chest: Effort normal and breath sounds normal. No respiratory distress. He has no wheezes. He has no rales.  Musculoskeletal: He exhibits no edema.  Prominent varicose veins bilateral lower legs without soreness  Lymphadenopathy:    He has no cervical adenopathy.  Skin: Skin is warm and dry. No rash noted.  Psychiatric: He has a normal mood and affect.  Nursing note and vitals reviewed.      Assessment & Plan:   Problem List Items Addressed This Visit    GERD (gastroesophageal reflux disease)    Controlled on omeprazole 20mg  daily longterm.      Relevant Medications   omeprazole (PRILOSEC) 20 MG capsule   CAD (coronary artery disease)    Mild by cath and calcium score. rec start aspirin and continue statin.      Relevant Medications   atorvastatin (LIPITOR) 20 MG tablet   chlorthalidone (HYGROTON) 25 MG tablet   aspirin (ASPIRIN EC) 81 MG EC tablet   Dyslipidemia - Primary    Chronic, on statin.  Check FLP today as fasting.  Relevant Medications   atorvastatin (LIPITOR) 20 MG tablet   Other Relevant Orders   Lipid panel   Comprehensive metabolic panel   TSH   Hypertension, essential    Chronic, stable. Continue chlorthalidone. Check Cr/K today.      Relevant Medications   atorvastatin (LIPITOR) 20 MG tablet   chlorthalidone (HYGROTON) 25 MG tablet   aspirin (ASPIRIN EC) 81 MG EC tablet   Other Relevant Orders   TSH   Asymptomatic varicose veins of bilateral lower extremities    No improvement with compression stockings. Will refer to [facility] for eval per pt  request.      Relevant Medications   atorvastatin (LIPITOR) 20 MG tablet   chlorthalidone (HYGROTON) 25 MG tablet   aspirin (ASPIRIN EC) 81 MG EC tablet   Other Relevant Orders   Ambulatory referral to Vascular Surgery   Obesity, Class I, BMI 30-34.9    Encouraged healthy diet and regular exercise to affect sustainable weight loss.          Follow up plan: Return in about 3 months (around 04/20/2016) for annual exam, prior fasting for blood work.  Ria Bush, MD

## 2016-01-19 NOTE — Assessment & Plan Note (Signed)
Chronic, on statin.  Check FLP today as fasting.

## 2016-01-19 NOTE — Assessment & Plan Note (Signed)
No improvement with compression stockings. Will refer to [facility] for eval per pt request.

## 2016-01-19 NOTE — Assessment & Plan Note (Signed)
Mild by cath and calcium score. rec start aspirin and continue statin.

## 2016-01-19 NOTE — Assessment & Plan Note (Signed)
Controlled on omeprazole 20mg  daily longterm.

## 2016-01-19 NOTE — Assessment & Plan Note (Signed)
Chronic, stable. Continue chlorthalidone. Check Cr/K today.

## 2016-01-19 NOTE — Progress Notes (Signed)
Pre visit review using our clinic review tool, if applicable. No additional management support is needed unless otherwise documented below in the visit note. 

## 2016-01-19 NOTE — Patient Instructions (Addendum)
Start aspirin 81mg  EC daily.  labwork today We will refer you to vein doctor for varicose veins. Continue using your compression stockings. Return at your convenience for physical.

## 2016-01-19 NOTE — Assessment & Plan Note (Signed)
Encouraged healthy diet and regular exercise to affect sustainable weight loss.

## 2016-01-24 ENCOUNTER — Encounter: Payer: Self-pay | Admitting: *Deleted

## 2016-01-25 ENCOUNTER — Other Ambulatory Visit: Payer: Self-pay | Admitting: Family Medicine

## 2016-01-25 MED ORDER — ATORVASTATIN CALCIUM 40 MG PO TABS: 40.0000 mg | ORAL_TABLET | Freq: Every day | ORAL | Status: DC

## 2016-02-29 ENCOUNTER — Encounter: Payer: Self-pay | Admitting: Family Medicine

## 2016-02-29 ENCOUNTER — Ambulatory Visit (INDEPENDENT_AMBULATORY_CARE_PROVIDER_SITE_OTHER): Payer: 59 | Admitting: Family Medicine

## 2016-02-29 VITALS — BP 126/82 | HR 84 | Temp 98.3°F | Ht 68.0 in | Wt 237.2 lb

## 2016-02-29 DIAGNOSIS — Z7289 Other problems related to lifestyle: Secondary | ICD-10-CM

## 2016-02-29 DIAGNOSIS — Z0000 Encounter for general adult medical examination without abnormal findings: Secondary | ICD-10-CM | POA: Diagnosis not present

## 2016-02-29 DIAGNOSIS — E785 Hyperlipidemia, unspecified: Secondary | ICD-10-CM

## 2016-02-29 DIAGNOSIS — E669 Obesity, unspecified: Secondary | ICD-10-CM

## 2016-02-29 DIAGNOSIS — I2510 Atherosclerotic heart disease of native coronary artery without angina pectoris: Secondary | ICD-10-CM | POA: Diagnosis not present

## 2016-02-29 DIAGNOSIS — Z789 Other specified health status: Secondary | ICD-10-CM

## 2016-02-29 DIAGNOSIS — K219 Gastro-esophageal reflux disease without esophagitis: Secondary | ICD-10-CM

## 2016-02-29 DIAGNOSIS — F1090 Alcohol use, unspecified, uncomplicated: Secondary | ICD-10-CM | POA: Insufficient documentation

## 2016-02-29 DIAGNOSIS — Z125 Encounter for screening for malignant neoplasm of prostate: Secondary | ICD-10-CM

## 2016-02-29 DIAGNOSIS — I8393 Asymptomatic varicose veins of bilateral lower extremities: Secondary | ICD-10-CM

## 2016-02-29 DIAGNOSIS — Z0001 Encounter for general adult medical examination with abnormal findings: Secondary | ICD-10-CM | POA: Insufficient documentation

## 2016-02-29 DIAGNOSIS — I10 Essential (primary) hypertension: Secondary | ICD-10-CM

## 2016-02-29 NOTE — Assessment & Plan Note (Signed)
Continue aspirin/statin. 

## 2016-02-29 NOTE — Progress Notes (Signed)
Pre visit review using our clinic review tool, if applicable. No additional management support is needed unless otherwise documented below in the visit note. 

## 2016-02-29 NOTE — Progress Notes (Signed)
BP 126/82 mmHg  Pulse 84  Temp(Src) 98.3 F (36.8 C) (Oral)  Ht 5\' 8"  (1.727 m)  Wt 237 lb 4 oz (107.616 kg)  BMI 36.08 kg/m2   CC: CPE  Subjective:    Patient ID: [name] [name], male    DOB: 05-10-1961, 55 y.o.   MRN: [id number]  HPI: [name] [name] is a 55 y.o. male presenting on 02/29/2016 for No chief complaint on file.   Established 2 mo ago.   Preventative: COLONOSCOPY Date: 2012 WNL per PCP report (from [place]) Prostate screening - discussed, would like screen today. Nocturia x1.  Flu shot - declines Tetanus - remote, declines.  Seat belt use discussed Sunscreen use discussed. No changing moles on skin.  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 Interim medical history since our last visit reviewed. Allergies and medications reviewed and updated. Current Outpatient Prescriptions on File Prior to Visit  Medication Sig  . aspirin (ASPIRIN EC) 81 MG EC tablet Take 81 mg by mouth daily. Swallow whole.  [name] atorvastatin (LIPITOR) 40 MG tablet Take 1 tablet (40 mg total) by mouth daily.  . chlorthalidone (HYGROTON) 25 MG tablet Take 1 tablet (25 mg total) by mouth daily.  [name] omeprazole (PRILOSEC) 20 MG capsule Take 1 capsule (20 mg total) by mouth daily.   No current facility-administered medications on file prior to visit.    Review of Systems  Constitutional: Negative for fever, chills, activity change, appetite change, fatigue and unexpected weight change.  HENT: Negative for hearing loss.   Eyes: Negative for visual disturbance.  Respiratory: Negative for cough, chest tightness, shortness of breath and wheezing.   Cardiovascular: Negative for chest pain, palpitations and leg swelling.  Gastrointestinal: Negative for nausea, vomiting, abdominal pain, diarrhea, constipation, blood in stool and  abdominal distention.  Genitourinary: Negative for hematuria and difficulty urinating.  Musculoskeletal: Negative for myalgias, arthralgias and neck pain.  Skin: Negative for rash.  Neurological: Negative for dizziness, seizures, syncope and headaches.  Hematological: Negative for adenopathy. Does not bruise/bleed easily.  Psychiatric/Behavioral: Negative for dysphoric mood. The patient is not nervous/anxious.    Per HPI unless specifically indicated in ROS section     Objective:    BP 126/82 mmHg  Pulse 84  Temp(Src) 98.3 F (36.8 C) (Oral)  Ht 5\' 8"  (1.727 m)  Wt 237 lb 4 oz (107.616 kg)  BMI 36.08 kg/m2  Wt Readings from Last 3 Encounters:  02/29/16 237 lb 4 oz (107.616 kg)  01/19/16 229 lb 8 oz (104.101 kg)  03/04/13 250 lb (113.399 kg)    Physical Exam  Constitutional: He is oriented to person, place, and time. He appears well-developed and well-nourished. No distress.  HENT:  Head: Normocephalic and atraumatic.  Right Ear: Hearing, tympanic membrane, external ear and ear canal normal.  Left Ear: Hearing, tympanic membrane, external ear and ear canal normal.  Nose: Nose normal.  Mouth/Throat: Uvula is midline, oropharynx is clear and moist and mucous membranes are normal. No oropharyngeal exudate, posterior oropharyngeal edema or posterior oropharyngeal erythema.  Eyes: Conjunctivae and EOM are normal. Pupils are equal, round, and reactive to light. No scleral icterus.  Neck: Normal range of motion. Neck supple. No thyromegaly present.  Cardiovascular: Normal rate, regular rhythm, normal heart sounds and intact distal pulses.   No murmur heard. Pulses:  Radial pulses are 2+ on the right side, and 2+ on the left side.  Pulmonary/Chest: Effort normal and breath sounds normal. No respiratory distress. He has no wheezes. He has no rales.  Abdominal: Soft. Bowel sounds are normal. He exhibits no distension and no mass. There is no tenderness. There is no rebound and no  guarding.  Genitourinary: Rectum normal and prostate normal. Rectal exam shows no external hemorrhoid, no internal hemorrhoid, no fissure, no mass, no tenderness and anal tone normal. Prostate is not enlarged and not tender.  Musculoskeletal: Normal range of motion. He exhibits no edema.  Lymphadenopathy:    He has no cervical adenopathy.  Neurological: He is alert and oriented to person, place, and time.  CN grossly intact, station and gait intact  Skin: Skin is warm and dry. No rash noted.  Psychiatric: He has a normal mood and affect. His behavior is normal. Judgment and thought content normal.  Nursing note and vitals reviewed.  Results for orders placed or performed in visit on 01/24/16  Lipid panel  Result Value Ref Range   Cholesterol 168    Triglycerides 128    HDL 40    LDL (calc) 102   Hepatic function panel  Result Value Ref Range   Total Bilirubin 0.9 mg/dL   Alkaline Phosphatase 112 U/L   AST 26 U/L   ALT 50    Albumin 4.5   CBC  Result Value Ref Range   WBC 7.1    HGB 15.1 g/dL   platelet count 13.3   Bilirubin, total  Result Value Ref Range   Creat 0.75       Assessment & Plan:   Problem List Items Addressed This Visit    GERD (gastroesophageal reflux disease)    Continue omeprazole 20mg  daily.      CAD (coronary artery disease)    Continue aspirin/statin.      Dyslipidemia    Chronic. Has restarted lipitor 40mg  daily. Check FLP in 2 mo      Relevant Orders   Lipid panel   Hypertension, essential    Chronic, stable. Continue current regimen.       Relevant Orders   Basic metabolic panel   Asymptomatic varicose veins of bilateral lower extremities    Has decided to forego vein evaluation at this time.       Obesity, Class I, BMI 30-34.9    Discussed healthy diet and lifestyle changes to affect sustainable weight loss.      Health maintenance examination - Primary    Preventative protocols reviewed and updated unless pt  declined. Discussed healthy diet and lifestyle.       Alcohol use ([place])    6 beers/day. Encouraged decreased use.       Other Visit Diagnoses    Special screening for malignant neoplasm of prostate        Relevant Orders    PSA        Follow up plan: Return in about 6 months (around 08/31/2016), or as needed, for follow up visit.  Ria Bush, MD

## 2016-02-29 NOTE — Patient Instructions (Addendum)
Check on name of [facility] practice and we can send for records.  Return in 2 months for lab visit only to recheck cholesterol and prostate. Return in 6 months for follow up visit.  Health Maintenance, Male A healthy lifestyle and preventative care can promote health and wellness.  Maintain regular health, dental, and eye exams.  Eat a healthy diet. Foods like vegetables, fruits, whole grains, low-fat dairy products, and lean protein foods contain the nutrients you need and are low in calories. Decrease your intake of foods high in solid fats, added sugars, and salt. Get information about a proper diet from your health care provider, if necessary.  Regular physical exercise is one of the most important things you can do for your health. Most adults should get at least 150 minutes of moderate-intensity exercise (any activity that increases your heart rate and causes you to sweat) each week. In addition, most adults need muscle-strengthening exercises on 2 or more days a week.   Maintain a healthy weight. The body mass index (BMI) is a screening tool to identify possible weight problems. It provides an estimate of body fat based on height and weight. Your health care provider can find your BMI and can help you achieve or maintain a healthy weight. For males 20 years and older:  A BMI below 18.5 is considered underweight.  A BMI of 18.5 to 24.9 is normal.  A BMI of 25 to 29.9 is considered overweight.  A BMI of 30 and above is considered obese.  Maintain normal blood lipids and cholesterol by exercising and minimizing your intake of saturated fat. Eat a balanced diet with plenty of fruits and vegetables. Blood tests for lipids and cholesterol should begin at age 68 and be repeated every 5 years. If your lipid or cholesterol levels are high, you are over age 51, or you are at high risk for heart disease, you may need your cholesterol levels checked more frequently.Ongoing high lipid and  cholesterol levels should be treated with medicines if diet and exercise are not working.  If you smoke, find out from your health care provider how to quit. If you do not use tobacco, do not start.  Lung cancer screening is recommended for adults aged 76-80 years who are at high risk for developing lung cancer because of a history of smoking. A yearly low-dose CT scan of the lungs is recommended for people who have at least a 30-pack-year history of smoking and are current smokers or have quit within the past 15 years. A pack year of smoking is smoking an average of 1 pack of cigarettes a day for 1 year (for example, a 30-pack-year history of smoking could mean smoking 1 pack a day for 30 years or 2 packs a day for 15 years). Yearly screening should continue until the smoker has stopped smoking for at least 15 years. Yearly screening should be stopped for people who develop a health problem that would prevent them from having lung cancer treatment.  If you choose to drink alcohol, do not have more than 2 drinks per day. One drink is considered to be 12 oz (360 mL) of beer, 5 oz (150 mL) of wine, or 1.5 oz (45 mL) of liquor.  Avoid the use of street drugs. Do not share needles with anyone. Ask for help if you need support or instructions about stopping the use of drugs.  High blood pressure causes heart disease and increases the risk of stroke. High blood pressure  is more likely to develop in:  People who have blood pressure in the end of the normal range (100-139/85-89 mm Hg).  People who are overweight or obese.  People who are African American.  If you are 13-63 years of age, have your blood pressure checked every 3-5 years. If you are 85 years of age or older, have your blood pressure checked every year. You should have your blood pressure measured twice--once when you are at a hospital or clinic, and once when you are not at a hospital or clinic. Record the average of the two measurements. To  check your blood pressure when you are not at a hospital or clinic, you can use:  An automated blood pressure machine at a pharmacy.  A home blood pressure monitor.  If you are 24-60 years old, ask your health care provider if you should take aspirin to prevent heart disease.  Diabetes screening involves taking a blood sample to check your fasting blood sugar level. This should be done once every 3 years after age 89 if you are at a normal weight and without risk factors for diabetes. Testing should be considered at a younger age or be carried out more frequently if you are overweight and have at least 1 risk factor for diabetes.  Colorectal cancer can be detected and often prevented. Most routine colorectal cancer screening begins at the age of 47 and continues through age 63. However, your health care provider may recommend screening at an earlier age if you have risk factors for colon cancer. On a yearly basis, your health care provider may provide home test kits to check for hidden blood in the stool. A small camera at the end of a tube may be used to directly examine the colon (sigmoidoscopy or colonoscopy) to detect the earliest forms of colorectal cancer. Talk to your health care provider about this at age 56 when routine screening begins. A direct exam of the colon should be repeated every 5-10 years through age 63, unless early forms of precancerous polyps or small growths are found.  People who are at an increased risk for hepatitis B should be screened for this virus. You are considered at high risk for hepatitis B if:  You were born in a country where hepatitis B occurs often. Talk with your health care provider about which countries are considered high risk.  Your parents were born in a high-risk country and you have not received a shot to protect against hepatitis B (hepatitis B vaccine).  You have HIV or AIDS.  You use needles to inject street drugs.  You live with, or have sex  with, someone who has hepatitis B.  You are a man who has sex with other men (MSM).  You get hemodialysis treatment.  You take certain medicines for conditions like cancer, organ transplantation, and autoimmune conditions.  Hepatitis C blood testing is recommended for all people born from [birth year] through 1964 and any individual with known risk factors for hepatitis C.  Healthy men should no longer receive prostate-specific antigen (PSA) blood tests as part of routine cancer screening. Talk to your health care provider about prostate cancer screening.  Testicular cancer screening is not recommended for adolescents or adult males who have no symptoms. Screening includes self-exam, a health care provider exam, and other screening tests. Consult with your health care provider about any symptoms you have or any concerns you have about testicular cancer.  Practice safe sex. Use condoms and avoid high-risk  sexual practices to reduce the spread of sexually transmitted infections (STIs).  You should be screened for STIs, including gonorrhea and chlamydia if:  You are sexually active and are younger than 24 years.  You are older than 24 years, and your health care provider tells you that you are at risk for this type of infection.  Your sexual activity has changed since you were last screened, and you are at an increased risk for chlamydia or gonorrhea. Ask your health care provider if you are at risk.  If you are at risk of being infected with HIV, it is recommended that you take a prescription medicine daily to prevent HIV infection. This is called pre-exposure prophylaxis (PrEP). You are considered at risk if:  You are a man who has sex with other men (MSM).  You are a heterosexual man who is sexually active with multiple partners.  You take drugs by injection.  You are sexually active with a partner who has HIV.  Talk with your health care provider about whether you are at high risk of being  infected with HIV. If you choose to begin PrEP, you should first be tested for HIV. You should then be tested every 3 months for as long as you are taking PrEP.  Use sunscreen. Apply sunscreen liberally and repeatedly throughout the day. You should seek shade when your shadow is shorter than you. Protect yourself by wearing long sleeves, pants, a wide-brimmed hat, and sunglasses year round whenever you are outdoors.  Tell your health care provider of new moles or changes in moles, especially if there is a change in shape or color. Also, tell your health care provider if a mole is larger than the size of a pencil eraser.  A one-time screening for abdominal aortic aneurysm (AAA) and surgical repair of large AAAs by ultrasound is recommended for men aged 1-75 years who are current or former smokers.  Stay current with your vaccines (immunizations).   This information is not intended to replace advice given to you by your health care provider. Make sure you discuss any questions you have with your health care provider.   Document Released: 02/08/2008 Document Revised: 09/02/2014 Document Reviewed: 01/07/2011 Elsevier Interactive Patient Education [employer].

## 2016-02-29 NOTE — Assessment & Plan Note (Signed)
Chronic, stable. Continue current regimen. 

## 2016-02-29 NOTE — Assessment & Plan Note (Signed)
-  Continue omeprazole 20 mg daily

## 2016-02-29 NOTE — Assessment & Plan Note (Signed)
Preventative protocols reviewed and updated unless pt declined. Discussed healthy diet and lifestyle.  

## 2016-02-29 NOTE — Assessment & Plan Note (Signed)
Discussed healthy diet and lifestyle changes to affect sustainable weight loss  

## 2016-02-29 NOTE — Assessment & Plan Note (Signed)
6 beers/day. Encouraged decreased use.

## 2016-02-29 NOTE — Assessment & Plan Note (Signed)
Chronic. Has restarted lipitor 40mg  daily. Check FLP in 2 mo

## 2016-02-29 NOTE — Assessment & Plan Note (Signed)
Has decided to forego vein evaluation at this time.

## 2016-05-03 ENCOUNTER — Other Ambulatory Visit: Payer: 59

## 2016-08-30 ENCOUNTER — Ambulatory Visit (INDEPENDENT_AMBULATORY_CARE_PROVIDER_SITE_OTHER): Payer: 59 | Admitting: Family Medicine

## 2016-08-30 DIAGNOSIS — Z0289 Encounter for other administrative examinations: Secondary | ICD-10-CM

## 2016-11-14 DIAGNOSIS — E785 Hyperlipidemia, unspecified: Secondary | ICD-10-CM | POA: Diagnosis not present

## 2016-11-14 DIAGNOSIS — R079 Chest pain, unspecified: Secondary | ICD-10-CM | POA: Diagnosis not present

## 2016-11-14 DIAGNOSIS — R0789 Other chest pain: Secondary | ICD-10-CM | POA: Diagnosis not present

## 2016-11-14 DIAGNOSIS — I10 Essential (primary) hypertension: Secondary | ICD-10-CM | POA: Diagnosis not present

## 2016-11-14 DIAGNOSIS — R42 Dizziness and giddiness: Secondary | ICD-10-CM | POA: Diagnosis not present

## 2016-11-14 DIAGNOSIS — J9811 Atelectasis: Secondary | ICD-10-CM | POA: Diagnosis not present

## 2016-11-14 DIAGNOSIS — R531 Weakness: Secondary | ICD-10-CM | POA: Diagnosis not present

## 2016-11-14 DIAGNOSIS — R410 Disorientation, unspecified: Secondary | ICD-10-CM | POA: Diagnosis not present

## 2017-01-07 ENCOUNTER — Other Ambulatory Visit: Payer: Self-pay | Admitting: Family Medicine

## 2017-02-05 ENCOUNTER — Other Ambulatory Visit: Payer: Self-pay | Admitting: Family Medicine

## 2017-05-30 ENCOUNTER — Other Ambulatory Visit: Payer: Self-pay | Admitting: Family Medicine

## 2017-06-02 ENCOUNTER — Other Ambulatory Visit: Payer: Self-pay | Admitting: Family Medicine

## 2017-07-27 ENCOUNTER — Other Ambulatory Visit: Payer: Self-pay | Admitting: Family Medicine

## 2017-08-24 ENCOUNTER — Other Ambulatory Visit: Payer: Self-pay | Admitting: Family Medicine

## 2017-08-26 HISTORY — PX: ROTATOR CUFF REPAIR: SHX139

## 2017-11-10 ENCOUNTER — Other Ambulatory Visit: Payer: Self-pay | Admitting: Family Medicine

## 2017-11-23 DIAGNOSIS — J111 Influenza due to unidentified influenza virus with other respiratory manifestations: Secondary | ICD-10-CM | POA: Diagnosis not present

## 2017-11-25 ENCOUNTER — Other Ambulatory Visit: Payer: Self-pay | Admitting: Family Medicine

## 2017-12-26 ENCOUNTER — Other Ambulatory Visit: Payer: Self-pay | Admitting: Family Medicine

## 2018-01-09 DIAGNOSIS — M5412 Radiculopathy, cervical region: Secondary | ICD-10-CM | POA: Diagnosis not present

## 2018-01-09 DIAGNOSIS — M47892 Other spondylosis, cervical region: Secondary | ICD-10-CM | POA: Diagnosis not present

## 2018-01-15 DIAGNOSIS — M5030 Other cervical disc degeneration, unspecified cervical region: Secondary | ICD-10-CM | POA: Diagnosis not present

## 2018-01-20 ENCOUNTER — Encounter: Payer: Self-pay | Admitting: Family Medicine

## 2018-01-20 DIAGNOSIS — M5030 Other cervical disc degeneration, unspecified cervical region: Secondary | ICD-10-CM | POA: Insufficient documentation

## 2018-01-21 DIAGNOSIS — M5030 Other cervical disc degeneration, unspecified cervical region: Secondary | ICD-10-CM | POA: Diagnosis not present

## 2018-01-29 DIAGNOSIS — M5030 Other cervical disc degeneration, unspecified cervical region: Secondary | ICD-10-CM | POA: Diagnosis not present

## 2018-02-04 ENCOUNTER — Other Ambulatory Visit: Payer: Self-pay | Admitting: Family Medicine

## 2018-02-04 NOTE — Telephone Encounter (Signed)
Copied from [place] [id number] [id number]. Topic: Quick Communication - Rx Refill/Question >> Feb 04, 2018  3:23 PM Synthia Innocent wrote: Medication: atorvastatin (LIPITOR) 40 MG tablet, chlorthalidone (HYGROTON) 25 MG tablet and omeprazole (PRILOSEC) 20 MG capsule   Has the patient contacted their pharmacy? Yes.  New pharmacy (Agent: If no, request that the patient contact the pharmacy for the refill.) (Agent: If yes, when and what did the pharmacy advise?)  Preferred Pharmacy (with phone number or street name): [facility] on [address]: Please be advised that RX refills may take up to 3 business days. We ask that you follow-up with your pharmacy.

## 2018-02-05 NOTE — Telephone Encounter (Signed)
Left message on voice mail to call office for appointment - needs for refills.

## 2018-02-10 DIAGNOSIS — S61412A Laceration without foreign body of left hand, initial encounter: Secondary | ICD-10-CM | POA: Diagnosis not present

## 2018-02-11 DIAGNOSIS — M5412 Radiculopathy, cervical region: Secondary | ICD-10-CM | POA: Diagnosis not present

## 2018-02-13 ENCOUNTER — Ambulatory Visit (INDEPENDENT_AMBULATORY_CARE_PROVIDER_SITE_OTHER): Payer: 59 | Admitting: Family Medicine

## 2018-02-13 ENCOUNTER — Encounter: Payer: Self-pay | Admitting: Family Medicine

## 2018-02-13 VITALS — BP 156/86 | HR 87 | Temp 97.9°F | Ht 68.0 in | Wt 252.8 lb

## 2018-02-13 DIAGNOSIS — E785 Hyperlipidemia, unspecified: Secondary | ICD-10-CM

## 2018-02-13 DIAGNOSIS — K219 Gastro-esophageal reflux disease without esophagitis: Secondary | ICD-10-CM

## 2018-02-13 DIAGNOSIS — M5030 Other cervical disc degeneration, unspecified cervical region: Secondary | ICD-10-CM

## 2018-02-13 DIAGNOSIS — E669 Obesity, unspecified: Secondary | ICD-10-CM | POA: Diagnosis not present

## 2018-02-13 DIAGNOSIS — I10 Essential (primary) hypertension: Secondary | ICD-10-CM

## 2018-02-13 DIAGNOSIS — Z125 Encounter for screening for malignant neoplasm of prostate: Secondary | ICD-10-CM

## 2018-02-13 LAB — LIPID PANEL
CHOL/HDL RATIO: 4
Cholesterol: 166 mg/dL (ref 0–200)
HDL: 37 mg/dL — AB (ref >39.00)
LDL Cholesterol: 101 mg/dL — ABNORMAL HIGH (ref 0–99)
NONHDL: 129.33
TRIGLYCERIDES: 140 mg/dL (ref 0.0–149.0)
VLDL: 28 mg/dL (ref 0.0–40.0)

## 2018-02-13 LAB — COMPREHENSIVE METABOLIC PANEL
ALBUMIN: 4.3 g/dL (ref 3.5–5.2)
ALT: 54 U/L — ABNORMAL HIGH (ref 0–53)
AST: 31 U/L (ref 0–37)
Alkaline Phosphatase: 110 U/L (ref 39–117)
BUN: 7 mg/dL (ref 6–23)
CALCIUM: 9.4 mg/dL (ref 8.4–10.5)
CHLORIDE: 104 meq/L (ref 96–112)
CO2: 31 mEq/L (ref 19–32)
CREATININE: 0.71 mg/dL (ref 0.40–1.50)
GFR: 122.11 mL/min (ref >60.00)
Glucose, Bld: 101 mg/dL — ABNORMAL HIGH (ref 70–99)
POTASSIUM: 4.8 meq/L (ref 3.5–5.1)
Sodium: 143 mEq/L (ref 135–145)
Total Bilirubin: 0.4 mg/dL (ref 0.2–1.2)
Total Protein: 6.5 g/dL (ref 6.0–8.3)

## 2018-02-13 LAB — PSA: PSA: 0.25 ng/mL (ref 0.10–4.00)

## 2018-02-13 LAB — TSH: TSH: 1.5 u[IU]/mL (ref 0.35–4.50)

## 2018-02-13 MED ORDER — OMEPRAZOLE 20 MG PO CPDR: 20.0000 mg | DELAYED_RELEASE_CAPSULE | Freq: Every day | ORAL | 3 refills | Status: DC

## 2018-02-13 MED ORDER — ATORVASTATIN CALCIUM 40 MG PO TABS: 40.0000 mg | ORAL_TABLET | Freq: Every day | ORAL | 3 refills | Status: DC

## 2018-02-13 MED ORDER — CHLORTHALIDONE 25 MG PO TABS: 25.0000 mg | ORAL_TABLET | Freq: Every day | ORAL | 3 refills | Status: DC

## 2018-02-13 NOTE — Patient Instructions (Addendum)
Medicines refilled today. Labs today. Monitor blood pressures at local pharmacy a few times a week to make sure blood pressures are staying <140/90. If running above this despite restarting medicine, let me know.  Work on high potassium diet - fruits/vegetables and whole grains - to help control blood pressures.   DASH Eating Plan DASH stands for "Dietary Approaches to Stop Hypertension." The DASH eating plan is a healthy eating plan that has been shown to reduce high blood pressure (hypertension). It may also reduce your risk for type 2 diabetes, heart disease, and stroke. The DASH eating plan may also help with weight loss. What are tips for following this plan? General guidelines  Avoid eating more than 2,300 mg (milligrams) of salt (sodium) a day. If you have hypertension, you may need to reduce your sodium intake to 1,500 mg a day.  Limit alcohol intake to no more than 1 drink a day for nonpregnant women and 2 drinks a day for men. One drink equals 12 oz of beer, 5 oz of wine, or 1 oz of hard liquor.  Work with your health care provider to maintain a healthy body weight or to lose weight. Ask what an ideal weight is for you.  Get at least 30 minutes of exercise that causes your heart to beat faster (aerobic exercise) most days of the week. Activities may include walking, swimming, or biking.  Work with your health care provider or diet and nutrition specialist (dietitian) to adjust your eating plan to your individual calorie needs. Reading food labels  Check food labels for the amount of sodium per serving. Choose foods with less than 5 percent of the Daily Value of sodium. Generally, foods with less than 300 mg of sodium per serving fit into this eating plan.  To find whole grains, look for the word "whole" as the first word in the ingredient list. Shopping  Buy products labeled as "low-sodium" or "no salt added."  Buy fresh foods. Avoid canned foods and premade or frozen  meals. Cooking  Avoid adding salt when cooking. Use salt-free seasonings or herbs instead of table salt or sea salt. Check with your health care provider or pharmacist before using salt substitutes.  Do not fry foods. Cook foods using healthy methods such as baking, boiling, grilling, and broiling instead.  Cook with heart-healthy oils, such as olive, canola, soybean, or sunflower oil. Meal planning   Eat a balanced diet that includes: ? 5 or more servings of fruits and vegetables each day. At each meal, try to fill half of your plate with fruits and vegetables. ? Up to 6-8 servings of whole grains each day. ? Less than 6 oz of lean meat, poultry, or fish each day. A 3-oz serving of meat is about the same size as a deck of cards. One egg equals 1 oz. ? 2 servings of low-fat dairy each day. ? A serving of nuts, seeds, or beans 5 times each week. ? Heart-healthy fats. Healthy fats called Omega-3 fatty acids are found in foods such as flaxseeds and coldwater fish, like sardines, salmon, and mackerel.  Limit how much you eat of the following: ? Canned or prepackaged foods. ? Food that is high in trans fat, such as fried foods. ? Food that is high in saturated fat, such as fatty meat. ? Sweets, desserts, sugary drinks, and other foods with added sugar. ? Full-fat dairy products.  Do not salt foods before eating.  Try to eat at least 2 vegetarian meals  each week.  Eat more home-cooked food and less restaurant, buffet, and fast food.  When eating at a restaurant, ask that your food be prepared with less salt or no salt, if possible. What foods are recommended? The items listed may not be a complete list. Talk with your dietitian about what dietary choices are best for you. Grains Whole-grain or whole-wheat bread. Whole-grain or whole-wheat pasta. Brown rice. [place]. Bulgur. Whole-grain and low-sodium cereals. Pita bread. Low-fat, low-sodium crackers. Whole-wheat flour  tortillas. Vegetables Fresh or frozen vegetables (raw, steamed, roasted, or grilled). Low-sodium or reduced-sodium tomato and vegetable juice. Low-sodium or reduced-sodium tomato sauce and tomato paste. Low-sodium or reduced-sodium canned vegetables. Fruits All fresh, dried, or frozen fruit. Canned fruit in natural juice (without added sugar). Meat and other protein foods Skinless chicken or [place]. Ground chicken or [place]. Pork with fat trimmed off. Fish and seafood. Egg whites. Dried beans, peas, or lentils. Unsalted nuts, nut butters, and seeds. Unsalted canned beans. Lean cuts of beef with fat trimmed off. Low-sodium, lean deli meat. Dairy Low-fat (1%) or fat-free (skim) milk. Fat-free, low-fat, or reduced-fat cheeses. Nonfat, low-sodium ricotta or cottage cheese. Low-fat or nonfat yogurt. Low-fat, low-sodium cheese. Fats and oils Soft margarine without trans fats. Vegetable oil. Low-fat, reduced-fat, or light mayonnaise and salad dressings (reduced-sodium). Canola, safflower, olive, soybean, and sunflower oils. Avocado. Seasoning and other foods Herbs. Spices. Seasoning mixes without salt. Unsalted popcorn and pretzels. Fat-free sweets. What foods are not recommended? The items listed may not be a complete list. Talk with your dietitian about what dietary choices are best for you. Grains Baked goods made with fat, such as croissants, muffins, or some breads. Dry pasta or rice meal packs. Vegetables Creamed or fried vegetables. Vegetables in a cheese sauce. Regular canned vegetables (not low-sodium or reduced-sodium). Regular canned tomato sauce and paste (not low-sodium or reduced-sodium). Regular tomato and vegetable juice (not low-sodium or reduced-sodium). Angie Fava. Olives. Fruits Canned fruit in a light or heavy syrup. Fried fruit. Fruit in cream or butter sauce. Meat and other protein foods Fatty cuts of meat. Ribs. Fried meat. Berniece Salines. Sausage. Bologna and other processed lunch meats.  Salami. Fatback. Hotdogs. Bratwurst. Salted nuts and seeds. Canned beans with added salt. Canned or smoked fish. Whole eggs or egg yolks. Chicken or [place] with skin. Dairy Whole or 2% milk, cream, and half-and-half. Whole or full-fat cream cheese. Whole-fat or sweetened yogurt. Full-fat cheese. Nondairy creamers. Whipped toppings. Processed cheese and cheese spreads. Fats and oils Butter. Stick margarine. Lard. Shortening. Ghee. Bacon fat. Tropical oils, such as coconut, palm kernel, or palm oil. Seasoning and other foods Salted popcorn and pretzels. Onion salt, garlic salt, seasoned salt, table salt, and sea salt. Worcestershire sauce. Tartar sauce. Barbecue sauce. Teriyaki sauce. Soy sauce, including reduced-sodium. Steak sauce. Canned and packaged gravies. Fish sauce. Oyster sauce. Cocktail sauce. Horseradish that you find on the shelf. Ketchup. Mustard. Meat flavorings and tenderizers. Bouillon cubes. Hot sauce and Tabasco sauce. Premade or packaged marinades. Premade or packaged taco seasonings. Relishes. Regular salad dressings. Where to find more information:  National Heart, Lung, and [place]: https://wilson-eaton.com/  American Heart Association: www.heart.org Summary  The DASH eating plan is a healthy eating plan that has been shown to reduce high blood pressure (hypertension). It may also reduce your risk for type 2 diabetes, heart disease, and stroke.  With the DASH eating plan, you should limit salt (sodium) intake to 2,300 mg a day. If you have hypertension, you may need to reduce  your sodium intake to 1,500 mg a day.  When on the DASH eating plan, aim to eat more fresh fruits and vegetables, whole grains, lean proteins, low-fat dairy, and heart-healthy fats.  Work with your health care provider or diet and nutrition specialist (dietitian) to adjust your eating plan to your individual calorie needs. This information is not intended to replace advice given to you by your health  care provider. Make sure you discuss any questions you have with your health care provider. Document Released: 08/01/2011 Document Revised: 08/05/2016 Document Reviewed: 08/05/2016 Elsevier Interactive Patient Education  [employer].

## 2018-02-13 NOTE — Assessment & Plan Note (Signed)
Neck pain limits activity. Hopeful to be able to improve exercise levels after neck surgery.

## 2018-02-13 NOTE — Assessment & Plan Note (Addendum)
Discussing possible cervical neck surgery.

## 2018-02-13 NOTE — Progress Notes (Signed)
BP (!) 156/86 (BP Location: Right Arm, Patient Position: Sitting, Cuff Size: Large)   Pulse 87   Temp 97.9 F (36.6 C) (Oral)   Ht 5\' 8"  (1.727 m)   Wt 252 lb 12 oz (114.6 kg)   SpO2 97%   BMI 38.43 kg/m    CC: med refill Subjective:    Patient ID: [name] [name], male    DOB: 16-Apr-1961, 57 y.o.   MRN: [id number]  HPI: [name] [name] is a 57 y.o. male presenting on 02/13/2018 for Medication Refill (Here for med f/u. States he has been out of BP med for about 2 wks. Requests 90-day refills for meds.)   Last seen 02/2016 for CPE.  Fasting today.   Cervical neck issues - bone spur C4/5 with stenosis. Considering neck surgery - Dr [name]. Neck pain limits activity.   Just had stitches placed L hand -healing well.   HTN - Compliant with current antihypertensive regimen of chlorthalidone 25mg  daily.  Does not regularly check blood pressures at home. No low blood pressure readings or symptoms of dizziness/syncope. Denies HA, vision changes, CP/tightness, SOB. Occasional leg cramping and swelling.   HLD - compliant with atorvastatin 40mg  daily without myalgias.  GERD - stable on daily PPI omeprazole 20mg  daily. Knows foods to avoid - acidic foods and sodas.   Relevant past medical, surgical, family and social history reviewed and updated as indicated. Interim medical history since our last visit reviewed. Allergies and medications reviewed and updated. Outpatient Medications Prior to Visit  Medication Sig Dispense Refill  . aspirin (ASPIRIN EC) 81 MG EC tablet Take 81 mg by mouth daily. Swallow whole.    [name] atorvastatin (LIPITOR) 40 MG tablet TAKE 1 TABLET BY MOUTH EVERY DAY 90 tablet 0  . chlorthalidone (HYGROTON) 25 MG tablet TAKE 1 TABLET BY MOUTH EVERY DAY 90 tablet 0  . omeprazole (PRILOSEC) 20 MG capsule TAKE 1 CAPSULE BY MOUTH EVERY DAY 30 capsule 1   No facility-administered medications prior to visit.      Per HPI unless specifically indicated in ROS section  below Review of Systems     Objective:    BP (!) 156/86 (BP Location: Right Arm, Patient Position: Sitting, Cuff Size: Large)   Pulse 87   Temp 97.9 F (36.6 C) (Oral)   Ht 5\' 8"  (1.727 m)   Wt 252 lb 12 oz (114.6 kg)   SpO2 97%   BMI 38.43 kg/m   Wt Readings from Last 3 Encounters:  02/13/18 252 lb 12 oz (114.6 kg)  02/29/16 237 lb 4 oz (107.6 kg)  01/19/16 229 lb 8 oz (104.1 kg)    Physical Exam  Constitutional: He appears well-developed and well-nourished. No distress.  HENT:  Mouth/Throat: Oropharynx is clear and moist. No oropharyngeal exudate.  Cardiovascular: Normal rate, regular rhythm and normal heart sounds.  No murmur heard. Pulmonary/Chest: Effort normal and breath sounds normal. No respiratory distress. He has no wheezes. He has no rales.  Musculoskeletal: He exhibits no edema.  Psychiatric: He has a normal mood and affect.  Nursing note and vitals reviewed.  Results for orders placed or performed in visit on 01/24/16  Lipid panel  Result Value Ref Range   Cholesterol 168    Triglycerides 128    HDL 40    LDL (calc) 102   Hepatic function panel  Result Value Ref Range   Total Bilirubin 0.9 mg/dL   Alkaline Phosphatase 112 U/L   AST 26 U/L  ALT 50    Albumin 4.5   CBC  Result Value Ref Range   WBC 7.1    HGB 15.1 g/dL   platelet count 13.3   Bilirubin, total  Result Value Ref Range   Creat 0.75       Assessment & Plan:   Problem List Items Addressed This Visit    Obesity, Class I, BMI 30-34.9    Neck pain limits activity. Hopeful to be able to improve exercise levels after neck surgery.       Hypertension, essential - Primary    Chronic, deteriorated as he ran out of BP meds 2 wks ago. Will refill today. Check K and Cr today.       Relevant Medications   atorvastatin (LIPITOR) 40 MG tablet   chlorthalidone (HYGROTON) 25 MG tablet   GERD (gastroesophageal reflux disease)    Chronic, controlled when taking omeprazole 20mg  daily -  continue.       Relevant Medications   omeprazole (PRILOSEC) 20 MG capsule   Dyslipidemia    Chronic, on medication. Update FLP. Continue lipitor 40mg  daily.       Relevant Medications   atorvastatin (LIPITOR) 40 MG tablet   Other Relevant Orders   Lipid panel   TSH   Comprehensive metabolic panel   DDD (degenerative disc disease), cervical    Discussing possible cervical neck surgery.        Other Visit Diagnoses    Special screening for malignant neoplasm of prostate       Relevant Orders   PSA       Meds ordered this encounter  Medications  . atorvastatin (LIPITOR) 40 MG tablet    Sig: Take 1 tablet (40 mg total) by mouth daily.    Dispense:  90 tablet    Refill:  3  . chlorthalidone (HYGROTON) 25 MG tablet    Sig: Take 1 tablet (25 mg total) by mouth daily.    Dispense:  90 tablet    Refill:  3  . omeprazole (PRILOSEC) 20 MG capsule    Sig: Take 1 capsule (20 mg total) by mouth daily.    Dispense:  90 capsule    Refill:  3   Orders Placed This Encounter  Procedures  . Lipid panel  . PSA  . TSH  . Comprehensive metabolic panel    Follow up plan: No follow-ups on file.  Ria Bush, MD

## 2018-02-13 NOTE — Assessment & Plan Note (Signed)
Chronic, controlled when taking omeprazole 20mg  daily - continue.

## 2018-02-13 NOTE — Assessment & Plan Note (Signed)
Chronic, on medication. Update FLP. Continue lipitor 40mg  daily.

## 2018-02-13 NOTE — Assessment & Plan Note (Signed)
Chronic, deteriorated as he ran out of BP meds 2 wks ago. Will refill today. Check K and Cr today.

## 2018-02-17 DIAGNOSIS — Z981 Arthrodesis status: Secondary | ICD-10-CM | POA: Diagnosis not present

## 2018-02-17 DIAGNOSIS — M5030 Other cervical disc degeneration, unspecified cervical region: Secondary | ICD-10-CM | POA: Diagnosis not present

## 2018-02-23 DIAGNOSIS — K219 Gastro-esophageal reflux disease without esophagitis: Secondary | ICD-10-CM | POA: Diagnosis not present

## 2018-02-27 ENCOUNTER — Ambulatory Visit (INDEPENDENT_AMBULATORY_CARE_PROVIDER_SITE_OTHER): Payer: 59 | Admitting: Family Medicine

## 2018-02-27 ENCOUNTER — Encounter: Payer: Self-pay | Admitting: Family Medicine

## 2018-02-27 ENCOUNTER — Ambulatory Visit (INDEPENDENT_AMBULATORY_CARE_PROVIDER_SITE_OTHER)
Admission: RE | Admit: 2018-02-27 | Discharge: 2018-02-27 | Disposition: A | Discharge status: Home / Self Care | Payer: 59 | Source: Ambulatory Visit | Attending: Family Medicine | Admitting: Family Medicine

## 2018-02-27 VITALS — BP 134/86 | HR 98 | Temp 98.3°F | Ht 68.0 in | Wt 248.2 lb

## 2018-02-27 DIAGNOSIS — I10 Essential (primary) hypertension: Secondary | ICD-10-CM | POA: Diagnosis not present

## 2018-02-27 DIAGNOSIS — Z0000 Encounter for general adult medical examination without abnormal findings: Secondary | ICD-10-CM | POA: Diagnosis not present

## 2018-02-27 DIAGNOSIS — Z01818 Encounter for other preprocedural examination: Secondary | ICD-10-CM | POA: Insufficient documentation

## 2018-02-27 DIAGNOSIS — I2510 Atherosclerotic heart disease of native coronary artery without angina pectoris: Secondary | ICD-10-CM

## 2018-02-27 DIAGNOSIS — Z7289 Other problems related to lifestyle: Secondary | ICD-10-CM

## 2018-02-27 DIAGNOSIS — Z789 Other specified health status: Secondary | ICD-10-CM

## 2018-02-27 DIAGNOSIS — E785 Hyperlipidemia, unspecified: Secondary | ICD-10-CM

## 2018-02-27 DIAGNOSIS — M5030 Other cervical disc degeneration, unspecified cervical region: Secondary | ICD-10-CM

## 2018-02-27 LAB — CBC WITH DIFFERENTIAL/PLATELET
BASOS PCT: 0.6 % (ref 0.0–3.0)
Basophils Absolute: 0 10*3/uL (ref 0.0–0.1)
EOS PCT: 0.9 % (ref 0.0–5.0)
Eosinophils Absolute: 0.1 10*3/uL (ref 0.0–0.7)
HCT: 43.7 % (ref 39.0–52.0)
Hemoglobin: 15.4 g/dL (ref 13.0–17.0)
LYMPHS ABS: 2.1 10*3/uL (ref 0.7–4.0)
Lymphocytes Relative: 27.2 % (ref 12.0–46.0)
MCHC: 35.1 g/dL (ref 30.0–36.0)
MCV: 93.4 fl (ref 78.0–100.0)
Monocytes Absolute: 0.7 10*3/uL (ref 0.1–1.0)
Monocytes Relative: 9 % (ref 3.0–12.0)
NEUTROS ABS: 4.8 10*3/uL (ref 1.4–7.7)
NEUTROS PCT: 62.3 % (ref 43.0–77.0)
Platelets: 258 10*3/uL (ref 150.0–400.0)
RBC: 4.68 Mil/uL (ref 4.22–5.81)
RDW: 13.4 % (ref 11.5–15.5)
WBC: 7.7 10*3/uL (ref 4.0–10.5)

## 2018-02-27 LAB — PROTIME-INR
INR: 1 ratio (ref 0.8–1.0)
Prothrombin Time: 11.6 s (ref 9.6–13.1)

## 2018-02-27 LAB — POTASSIUM: POTASSIUM: 4 meq/L (ref 3.5–5.1)

## 2018-02-27 NOTE — Assessment & Plan Note (Signed)
Chronic, marked improvement on statin - continue. The 10-year ASCVD risk score Mikey Bussing DC Brooke Bonito., et al., 2013) is: 7.4%   Values used to calculate the score:     Age: 57 years     Sex: Male     Is Non-Hispanic African American: No     Diabetic: No     Tobacco smoker: No     Systolic Blood Pressure: 692 mmHg     Is BP treated: Yes     HDL Cholesterol: 37 mg/dL     Total Cholesterol: 166 mg/dL

## 2018-02-27 NOTE — Assessment & Plan Note (Signed)
Chronic, stable on chlorthalidone. Update K.

## 2018-02-27 NOTE — Assessment & Plan Note (Signed)
Significant increase in alcohol use over the last 3 months with increasing neck pain. Advised he let anesthesiology known during upcoming preop visit next week - may need benzo perioperatively. Pt aware of need to decrease use.

## 2018-02-27 NOTE — Assessment & Plan Note (Signed)
Preventative protocols reviewed and updated unless pt declined. Discussed healthy diet and lifestyle.  

## 2018-02-27 NOTE — Assessment & Plan Note (Signed)
Upcoming neck surgery later this month (Dr [name]).  preop eval performed today.

## 2018-02-27 NOTE — Progress Notes (Signed)
BP 134/86 (BP Location: Left Arm, Patient Position: Sitting, Cuff Size: Large)   Pulse 98   Temp 98.3 F (36.8 C) (Oral)   Ht 5\' 8"  (1.727 m)   Wt 248 lb 4 oz (112.6 kg)   SpO2 96%   BMI 37.75 kg/m    CC: CPE Subjective:    Patient ID: [name] [name], male    DOB: [birth year], 57 y.o.   MRN: [id number]  HPI: [name] [name] is a 57 y.o. male presenting on 02/27/2018 for Annual Exam and Pre-op Exam (Needs info sent to Orson Slick with [facility] at fax: [phone], phn: [phone]. Back surgery scheduled for 03/12/18. )   See prior note for details.   Cervical neck issues - bone spur C4/5 with stenosis. Neck pain limits activity. Upcoming back surgery 03/12/2018 (Dr [name] @ [facility]) requests preop eval today. preop scheduled with anesthesia next Friday.   Has tolerated GETA well in the past. Last GETA surgery was 2012 A[facility] at [place].  Cardiac catheterization - no significant CAD 2013.  Denies chest pain, tightness, dyspnea,   Preventative: COLONOSCOPY Date: 2012 WNL per PCP report (from [place]) Prostate screening - discussed, declines screening. Last year's normal. Nocturia x1.  Flu shot - declines Tetanus - remote, declines.  Seat belt use discussed Sunscreen use discussed. No changing moles on skin. Non smoker Alcohol - significant use of liquor to control neck pain 16 oz/day - over last 3 mo.  Dentist - has not seen - last had tooth pulled 2 yrs ago.  Eye exam - not recently - uses readers.   Lives with wife, 1 dog Edu: HS Occ: maintenance Activity: active at work (walking) - limited by neck pain Diet: some water, some fruits/vegetables   Relevant past medical, surgical, family and social history reviewed and updated as indicated. Interim medical history since our last visit reviewed. Allergies and medications reviewed and updated. Outpatient Medications Prior to Visit  Medication Sig Dispense Refill  . aspirin (ASPIRIN EC) 81 MG EC tablet Take 81  mg by mouth daily. Swallow whole.    [name] atorvastatin (LIPITOR) 40 MG tablet Take 1 tablet (40 mg total) by mouth daily. 90 tablet 3  . chlorthalidone (HYGROTON) 25 MG tablet Take 1 tablet (25 mg total) by mouth daily. 90 tablet 3  . HYDROcodone-acetaminophen (NORCO/VICODIN) 5-325 MG tablet Take 1 tablet by mouth 4 (four) times daily as needed. Takes 2 tablets daily at bedtime as needed  0  . omeprazole (PRILOSEC) 20 MG capsule Take 1 capsule (20 mg total) by mouth daily. 90 capsule 3   No facility-administered medications prior to visit.      Per HPI unless specifically indicated in ROS section below Review of Systems  Constitutional: Negative for activity change, appetite change, chills, fatigue, fever and unexpected weight change.  HENT: Negative for hearing loss.   Eyes: Negative for visual disturbance.  Respiratory: Negative for cough, chest tightness, shortness of breath and wheezing.   Cardiovascular: Positive for palpitations (rare). Negative for chest pain and leg swelling.  Gastrointestinal: Negative for abdominal distention, abdominal pain, blood in stool, constipation, diarrhea, nausea and vomiting.  Genitourinary: Negative for difficulty urinating and hematuria.  Musculoskeletal: Positive for neck pain (radiation down arms). Negative for arthralgias and myalgias.  Skin: Negative for rash.  Neurological: Negative for dizziness, seizures, syncope and headaches.  Hematological: Negative for adenopathy. Does not bruise/bleed easily.  Psychiatric/Behavioral: Negative for dysphoric mood. The patient is not nervous/anxious.  Objective:    BP 134/86 (BP Location: Left Arm, Patient Position: Sitting, Cuff Size: Large)   Pulse 98   Temp 98.3 F (36.8 C) (Oral)   Ht 5\' 8"  (1.727 m)   Wt 248 lb 4 oz (112.6 kg)   SpO2 96%   BMI 37.75 kg/m   Wt Readings from Last 3 Encounters:  02/27/18 248 lb 4 oz (112.6 kg)  02/13/18 252 lb 12 oz (114.6 kg)  02/29/16 237 lb 4 oz (107.6  kg)    Physical Exam  Constitutional: He is oriented to person, place, and time. He appears well-developed and well-nourished. No distress.  HENT:  Head: Normocephalic and atraumatic.  Right Ear: Hearing, tympanic membrane, external ear and ear canal normal.  Left Ear: Hearing, tympanic membrane, external ear and ear canal normal.  Nose: Nose normal.  Mouth/Throat: Uvula is midline, oropharynx is clear and moist and mucous membranes are normal. No oropharyngeal exudate, posterior oropharyngeal edema or posterior oropharyngeal erythema.  Eyes: Pupils are equal, round, and reactive to light. Conjunctivae and EOM are normal. No scleral icterus.  Neck: Normal range of motion. Neck supple. No thyromegaly present.  Cardiovascular: Normal rate, regular rhythm, normal heart sounds and intact distal pulses.  No murmur heard. Pulses:      Radial pulses are 2+ on the right side, and 2+ on the left side.  Pulmonary/Chest: Effort normal and breath sounds normal. No respiratory distress. He has no wheezes. He has no rales.  Abdominal: Soft. Bowel sounds are normal. He exhibits no distension and no mass. There is no tenderness. There is no rebound and no guarding.  Musculoskeletal: Normal range of motion. He exhibits no edema.  Lymphadenopathy:    He has no cervical adenopathy.  Neurological: He is alert and oriented to person, place, and time.  CN grossly intact, station and gait intact  Skin: Skin is warm and dry. No rash noted.  Psychiatric: He has a normal mood and affect. His behavior is normal. Judgment and thought content normal.  Nursing note and vitals reviewed.  Results for orders placed or performed in visit on 02/13/18  Lipid panel  Result Value Ref Range   Cholesterol 166 0 - 200 mg/dL   Triglycerides 140.0 0.0 - 149.0 mg/dL   HDL 37.00 (L) >39.00 mg/dL   VLDL 28.0 0.0 - 40.0 mg/dL   LDL Cholesterol 101 (H) 0 - 99 mg/dL   Total CHOL/HDL Ratio 4    NonHDL 129.33   PSA  Result Value  Ref Range   PSA 0.25 0.10 - 4.00 ng/mL  TSH  Result Value Ref Range   TSH 1.50 0.35 - 4.50 uIU/mL  Comprehensive metabolic panel  Result Value Ref Range   Sodium 143 135 - 145 mEq/L   Potassium 4.8 3.5 - 5.1 mEq/L   Chloride 104 96 - 112 mEq/L   CO2 31 19 - 32 mEq/L   Glucose, Bld 101 (H) 70 - 99 mg/dL   BUN 7 6 - 23 mg/dL   Creatinine, Ser 0.71 0.40 - 1.50 mg/dL   Total Bilirubin 0.4 0.2 - 1.2 mg/dL   Alkaline Phosphatase 110 39 - 117 U/L   AST 31 0 - 37 U/L   ALT 54 (H) 0 - 53 U/L   Total Protein 6.5 6.0 - 8.3 g/dL   Albumin 4.3 3.5 - 5.2 g/dL   Calcium 9.4 8.4 - 10.5 mg/dL   GFR 122.11 >60.00 mL/min   DG Chest 2 View CLINICAL DATA:  Preoperative evaluation.  Hypertension.  EXAM: CHEST - 2 VIEW  COMPARISON:  March 18, 2014  FINDINGS: There is no edema or consolidation. Heart size and pulmonary vascularity are normal. No adenopathy. There is degenerative change in the thoracic spine.  IMPRESSION: No edema or consolidation.  Electronically Signed   By: Lowella Grip III M.D.   On: 02/27/2018 13:13  EKG - NSR rate 90s, normal axis, intervals, no acute ST/T changes.     Assessment & Plan:   Problem List Items Addressed This Visit    Severe obesity (BMI 35.0-39.9) with comorbidity ([place])    Weight gain in setting of decreased activity due to neck pain. Hopeful to become more active after surgery.       Pre-op evaluation    RCRI score = 0 Check baseline EKG today and CXR. Check CBC, PT/INR and update K.  Adequately low risk to proceed with surgery without further evaluation.      Relevant Orders   CBC with Differential/Platelet   Protime-INR   DG Chest 2 View (Completed)   EKG 12-Lead (Completed)   Hypertension, essential    Chronic, stable on chlorthalidone. Update K.       Relevant Orders   Potassium   Health maintenance examination - Primary    Preventative protocols reviewed and updated unless pt declined. Discussed healthy diet and lifestyle.         Dyslipidemia    Chronic, marked improvement on statin - continue. The 10-year ASCVD risk score Mikey Bussing DC Brooke Bonito., et al., 2013) is: 7.4%   Values used to calculate the score:     Age: 6 years     Sex: Male     Is Non-Hispanic African American: No     Diabetic: No     Tobacco smoker: No     Systolic Blood Pressure: 762 mmHg     Is BP treated: Yes     HDL Cholesterol: 37 mg/dL     Total Cholesterol: 166 mg/dL       DDD (degenerative disc disease), cervical    Upcoming neck surgery later this month (Dr [name]).  preop eval performed today.       Relevant Medications   HYDROcodone-acetaminophen (NORCO/VICODIN) 5-325 MG tablet   CAD (coronary artery disease)    Mild. Update EKG. On aspirin, statin.       Alcohol use    Significant increase in alcohol use over the last 3 months with increasing neck pain. Advised he let anesthesiology known during upcoming preop visit next week - may need benzo perioperatively. Pt aware of need to decrease use.           No orders of the defined types were placed in this encounter.  Orders Placed This Encounter  Procedures  . DG Chest 2 View    Standing Status:   Future    Number of Occurrences:   1    Standing Expiration Date:   05/01/2019    Order Specific Question:   Reason for Exam (SYMPTOM  OR DIAGNOSIS REQUIRED)    Answer:   preop eval    Order Specific Question:   Preferred imaging location?    Answer:   [facility]    Order Specific Question:   Radiology Contrast Protocol - do NOT remove file path    Answer:   [email or web address]  . CBC with Differential/Platelet  . Potassium  . Protime-INR  . EKG 12-Lead    Follow up plan: Return in about 1 year (around 02/28/2019) for  annual exam, prior fasting for blood work.  Ria Bush, MD

## 2018-02-27 NOTE — Patient Instructions (Addendum)
Potassium check today.  EKG today.  Chest xray today.  Work on cutting down on alcohol.   Health Maintenance, Male A healthy lifestyle and preventive care is important for your health and wellness. Ask your health care provider about what schedule of regular examinations is right for you. What should I know about weight and diet? Eat a Healthy Diet  Eat plenty of vegetables, fruits, whole grains, low-fat dairy products, and lean protein.  Do not eat a lot of foods high in solid fats, added sugars, or salt.  Maintain a Healthy Weight Regular exercise can help you achieve or maintain a healthy weight. You should:  Do at least 150 minutes of exercise each week. The exercise should increase your heart rate and make you sweat (moderate-intensity exercise).  Do strength-training exercises at least twice a week.  Watch Your Levels of Cholesterol and Blood Lipids  Have your blood tested for lipids and cholesterol every 5 years starting at 56 years of age. If you are at high risk for heart disease, you should start having your blood tested when you are 56 years old. You may need to have your cholesterol levels checked more often if: ? Your lipid or cholesterol levels are high. ? You are older than 56 years of age. ? You are at high risk for heart disease.  What should I know about cancer screening? Many types of cancers can be detected early and may often be prevented. Lung Cancer  You should be screened every year for lung cancer if: ? You are a current smoker who has smoked for at least 30 years. ? You are a former smoker who has quit within the past 15 years.  Talk to your health care provider about your screening options, when you should start screening, and how often you should be screened.  Colorectal Cancer  Routine colorectal cancer screening usually begins at 56 years of age and should be repeated every 5-10 years until you are 56 years old. You may need to be screened more  often if early forms of precancerous polyps or small growths are found. Your health care provider may recommend screening at an earlier age if you have risk factors for colon cancer.  Your health care provider may recommend using home test kits to check for hidden blood in the stool.  A small camera at the end of a tube can be used to examine your colon (sigmoidoscopy or colonoscopy). This checks for the earliest forms of colorectal cancer.  Prostate and Testicular Cancer  Depending on your age and overall health, your health care provider may do certain tests to screen for prostate and testicular cancer.  Talk to your health care provider about any symptoms or concerns you have about testicular or prostate cancer.  Skin Cancer  Check your skin from head to toe regularly.  Tell your health care provider about any new moles or changes in moles, especially if: ? There is a change in a mole's size, shape, or color. ? You have a mole that is larger than a pencil eraser.  Always use sunscreen. Apply sunscreen liberally and repeat throughout the day.  Protect yourself by wearing long sleeves, pants, a wide-brimmed hat, and sunglasses when outside.  What should I know about heart disease, diabetes, and high blood pressure?  If you are 7-54 years of age, have your blood pressure checked every 3-5 years. If you are 62 years of age or older, have your blood pressure checked every year.  You should have your blood pressure measured twice-once when you are at a hospital or clinic, and once when you are not at a hospital or clinic. Record the average of the two measurements. To check your blood pressure when you are not at a hospital or clinic, you can use: ? An automated blood pressure machine at a pharmacy. ? A home blood pressure monitor.  Talk to your health care provider about your target blood pressure.  If you are between 10-37 years old, ask your health care provider if you should take  aspirin to prevent heart disease.  Have regular diabetes screenings by checking your fasting blood sugar level. ? If you are at a normal weight and have a low risk for diabetes, have this test once every three years after the age of 58. ? If you are overweight and have a high risk for diabetes, consider being tested at a younger age or more often.  A one-time screening for abdominal aortic aneurysm (AAA) by ultrasound is recommended for men aged 74-75 years who are current or former smokers. What should I know about preventing infection? Hepatitis B If you have a higher risk for hepatitis B, you should be screened for this virus. Talk with your health care provider to find out if you are at risk for hepatitis B infection. Hepatitis C Blood testing is recommended for:  Everyone born from [birth year] through 1964.  Anyone with known risk factors for hepatitis C.  Sexually Transmitted Diseases (STDs)  You should be screened each year for STDs including gonorrhea and chlamydia if: ? You are sexually active and are younger than 56 years of age. ? You are older than 56 years of age and your health care provider tells you that you are at risk for this type of infection. ? Your sexual activity has changed since you were last screened and you are at an increased risk for chlamydia or gonorrhea. Ask your health care provider if you are at risk.  Talk with your health care provider about whether you are at high risk of being infected with HIV. Your health care provider may recommend a prescription medicine to help prevent HIV infection.  What else can I do?  Schedule regular health, dental, and eye exams.  Stay current with your vaccines (immunizations).  Do not use any tobacco products, such as cigarettes, chewing tobacco, and e-cigarettes. If you need help quitting, ask your health care provider.  Limit alcohol intake to no more than 2 drinks per day. One drink equals 12 ounces of beer, 5 ounces of  wine, or 1 ounces of hard liquor.  Do not use street drugs.  Do not share needles.  Ask your health care provider for help if you need support or information about quitting drugs.  Tell your health care provider if you often feel depressed.  Tell your health care provider if you have ever been abused or do not feel safe at home. This information is not intended to replace advice given to you by your health care provider. Make sure you discuss any questions you have with your health care provider. Document Released: 02/08/2008 Document Revised: 04/10/2016 Document Reviewed: 05/16/2015 Elsevier Interactive Patient Education  [employer].

## 2018-02-27 NOTE — Assessment & Plan Note (Addendum)
Weight gain in setting of decreased activity due to neck pain. Hopeful to become more active after surgery.

## 2018-02-27 NOTE — Assessment & Plan Note (Addendum)
RCRI score = 0 Check baseline EKG today and CXR. Check CBC, PT/INR and update K.  Adequately low risk to proceed with surgery without further evaluation.

## 2018-02-27 NOTE — Assessment & Plan Note (Addendum)
Mild. Update EKG. On aspirin, statin.

## 2018-03-02 ENCOUNTER — Telehealth: Payer: Self-pay

## 2018-03-02 NOTE — Telephone Encounter (Signed)
Faxed pre op info (office note, EKG and labs) from 02/27/18 OV to [facility] [place]: ATTN:  Dr. [name] at [phone], per Dr. Darnell Level.  Then to ATTN:  Derl Barrow at [phone], per pt (see OV, 02/27/18).

## 2018-03-05 NOTE — Progress Notes (Addendum)
PCP: Ria Bush, MD  Cardiologist: pt denies  EKG: 02/27/18 in Epic  Stress test: 12/01/13 in Epic  ECHO: 12/04/15 in Epic  Cardiac Cath: 01/06/12 in Epic  Chest x-ray: 02/27/18 in [facility]

## 2018-03-05 NOTE — Pre-Procedure Instructions (Signed)
ACHILLES NEVILLE  03/05/2018      [id number] - [place], [place] [address] [place] [address] Phone: [phone] Fax: [phone]    Your procedure is scheduled on March 12, 2018.  Report to [facility] Admitting at [place] AM.  Call this number if you have problems the morning of surgery:  [phone]   Remember:  Do not eat or drink after midnight.    Take these medicines the morning of surgery with A SIP OF WATER  Hydrocodone-acetaminophen (norco)-if needed for pain Omeprazole (prilosec)  Follow your surgeon's instructions on when to hold/resume aspirin.  If no instructions were given call the office to determine how they would like to you take aspirin  7 days prior to surgery STOP taking any Aleve, Naproxen, Ibuprofen, Motrin, Advil, Goody's, BC's, all herbal medications, fish oil, and all vitamins   Do not wear jewelry, make-up or nail polish.  Do not wear lotions, powders, or perfumes, or deodorant.  Do not shave 48 hours prior to surgery.  Men may shave face and neck.  Do not bring valuables to the hospital.  [facility] is not responsible for any belongings or valuables.  Contacts, dentures or bridgework may not be worn into surgery.  Leave your suitcase in the car.  After surgery it may be brought to your room.  For patients admitted to the hospital, discharge time will be determined by your treatment team.  Patients discharged the day of surgery will not be allowed to drive home.    [facility]- Preparing For Surgery  Before surgery, you can play an important role. Because skin is not sterile, your skin needs to be as free of germs as possible. You can reduce the number of germs on your skin by washing with CHG (chlorahexidine gluconate) Soap before surgery.  CHG is an antiseptic cleaner which kills germs and bonds with the skin to continue killing germs  even after washing.    Oral Hygiene is also important to reduce your risk of infection.  Remember - BRUSH YOUR TEETH THE MORNING OF SURGERY WITH YOUR REGULAR TOOTHPASTE  Please do not use if you have an allergy to CHG or antibacterial soaps. If your skin becomes reddened/irritated stop using the CHG.  Do not shave (including legs and underarms) for at least 48 hours prior to first CHG shower. It is OK to shave your face.  Please follow these instructions carefully.   1. Shower the NIGHT BEFORE SURGERY and the MORNING OF SURGERY with CHG.   2. If you chose to wash your hair, wash your hair first as usual with your normal shampoo.  3. After you shampoo, rinse your hair and body thoroughly to remove the shampoo.  4. Use CHG as you would any other liquid soap. You can apply CHG directly to the skin and wash gently with a scrungie or a clean washcloth.   5. Apply the CHG Soap to your body ONLY FROM THE NECK DOWN.  Do not use on open wounds or open sores. Avoid contact with your eyes, ears, mouth and genitals (private parts). Wash Face and genitals (private parts)  with your normal soap.  6. Wash thoroughly, paying special attention to the area where your surgery will be performed.  7. Thoroughly rinse your body with warm water from the neck down.  8. DO NOT shower/wash with your normal soap  after using and rinsing off the CHG Soap.  9. Pat yourself dry with a CLEAN TOWEL.  10. Wear CLEAN PAJAMAS to bed the night before surgery, wear comfortable clothes the morning of surgery  11. Place CLEAN SHEETS on your bed the night of your first shower and DO NOT SLEEP WITH PETS.  Day of Surgery:  Do not apply any deodorants/lotions.  Please wear clean clothes to the hospital/surgery center.   Remember to brush your teeth WITH YOUR REGULAR TOOTHPASTE.  Please read over the following fact sheets that you were given. Pain Booklet, Coughing and Deep Breathing, MRSA Information and Surgical Site  Infection Prevention

## 2018-03-06 ENCOUNTER — Other Ambulatory Visit: Payer: Self-pay

## 2018-03-06 ENCOUNTER — Encounter (HOSPITAL_COMMUNITY): Payer: Self-pay

## 2018-03-06 ENCOUNTER — Encounter (HOSPITAL_COMMUNITY)
Admission: RE | Admit: 2018-03-06 | Discharge: 2018-03-06 | Disposition: A | Discharge status: Home / Self Care | Payer: 59 | Source: Ambulatory Visit | Attending: Orthopedic Surgery | Admitting: Orthopedic Surgery

## 2018-03-06 DIAGNOSIS — Z01812 Encounter for preprocedural laboratory examination: Secondary | ICD-10-CM | POA: Insufficient documentation

## 2018-03-06 DIAGNOSIS — M5412 Radiculopathy, cervical region: Secondary | ICD-10-CM | POA: Insufficient documentation

## 2018-03-06 DIAGNOSIS — M4802 Spinal stenosis, cervical region: Secondary | ICD-10-CM | POA: Insufficient documentation

## 2018-03-06 LAB — COMPREHENSIVE METABOLIC PANEL
ALBUMIN: 4.2 g/dL (ref 3.5–5.0)
ALT: 70 U/L — AB (ref 0–44)
AST: 35 U/L (ref 15–41)
Alkaline Phosphatase: 101 U/L (ref 38–126)
Anion gap: 12 (ref 5–15)
BUN: 12 mg/dL (ref 6–20)
CHLORIDE: 100 mmol/L (ref 98–111)
CO2: 27 mmol/L (ref 22–32)
CREATININE: 0.8 mg/dL (ref 0.61–1.24)
Calcium: 9.9 mg/dL (ref 8.9–10.3)
GFR calc Af Amer: >60 mL/min (ref >60)
GFR calc non Af Amer: >60 mL/min (ref >60)
Glucose, Bld: 115 mg/dL — ABNORMAL HIGH (ref 70–99)
POTASSIUM: 3.8 mmol/L (ref 3.5–5.1)
Sodium: 139 mmol/L (ref 135–145)
Total Bilirubin: 0.8 mg/dL (ref 0.3–1.2)
Total Protein: 7.2 g/dL (ref 6.5–8.1)

## 2018-03-06 LAB — CBC
HEMATOCRIT: 50.6 % (ref 39.0–52.0)
Hemoglobin: 17 g/dL (ref 13.0–17.0)
MCH: 31.9 pg (ref 26.0–34.0)
MCHC: 33.6 g/dL (ref 30.0–36.0)
MCV: 94.9 fL (ref 78.0–100.0)
PLATELETS: 263 10*3/uL (ref 150–400)
RBC: 5.33 MIL/uL (ref 4.22–5.81)
RDW: 12.5 % (ref 11.5–15.5)
WBC: 7 10*3/uL (ref 4.0–10.5)

## 2018-03-06 LAB — SURGICAL PCR SCREEN
MRSA, PCR: NEGATIVE
STAPHYLOCOCCUS AUREUS: NEGATIVE

## 2018-03-06 NOTE — H&P (Addendum)
Patient ID: [name] [name] MRN: [id number] DOB/AGE: 1961-04-26 57 y.o.  Admit date: (Not on file)  Admission Diagnoses:  Cervical foraminal Stenosis and radiculopathy  HPI: Patient presents for his history and physical appointment prior to and anterior cervical discectomy and fusion at C4-6 patient has had a previous surgery at C6- 7.  Overall patient reports a history of good health  Past Medical History: Past Medical History:  Diagnosis Date  . Alcohol use    regularly 3 beers/day  . CAD (coronary artery disease)    mild  . Dyslipidemia   . GERD (gastroesophageal reflux disease)    with esophagitis  . History of Helicobacter pylori infection   . Hypertension, essential   . Obesity, Class I, BMI 30-34.9 01/19/2016    Surgical History: Past Surgical History:  Procedure Laterality Date  . ANTERIOR CERVICAL DECOMP/DISCECTOMY FUSION  2012   C4-5 [facility])  . CARDIAC CATHETERIZATION  2013   no significant CAD, nl LVEF 60% [name])  . COLONOSCOPY  2012   WNL per PCP report  . CT CTA CORONARY W/CA SCORE W/CM &/OR WO/CM  2015   minor luminal irregularities, Ca score = 90 [name])  . KNEE ARTHROSCOPY Bilateral     Family History: Family History  Problem Relation Age of Onset  . CAD Father 64       MI  . Hyperlipidemia Father   . Diabetes Paternal Grandfather   . Cancer Mother 61       deceased from bone cancer  . Stroke Neg Hx     Social History: Social History   Socioeconomic History  . Marital status: Married    Spouse name: Not on file  . Number of children: Not on file  . Years of education: Not on file  . Highest education level: Not on file  Occupational History  . Not on file  Social Needs  . Financial resource strain: Not on file  . Food insecurity:    Worry: Not on file    Inability: Not on file  . Transportation needs:    Medical: Not on file    Non-medical: Not on file  Tobacco Use  . Smoking status: Never Smoker  . Smokeless tobacco: Never  Used  Substance and Sexual Activity  . Alcohol use: Yes    Alcohol/week: 0.0 oz    Comment: 8 oz whiskey a night  . Drug use: No  . Sexual activity: Not on file  Lifestyle  . Physical activity:    Days per week: Not on file    Minutes per session: Not on file  . Stress: Not on file  Relationships  . Social connections:    Talks on phone: Not on file    Gets together: Not on file    Attends religious service: Not on file    Active member of club or organization: Not on file    Attends meetings of clubs or organizations: Not on file    Relationship status: Not on file  . Intimate partner violence:    Fear of current or ex partner: Not on file    Emotionally abused: Not on file    Physically abused: Not on file    Forced sexual activity: Not on file  Other Topics Concern  . Not on file  Social History Narrative   Lives with wife, 1 dog   Edu: HS   Occ: maintenance   Activity: active at work   Diet: some water, fruits daily,  beer    Allergies: Patient has no known allergies.  Medications: I have reviewed the patient's current medications.  Vital Signs: No data found.  Radiology: Dg Chest 2 View  Result Date: 02/27/2018 CLINICAL DATA:  Preoperative evaluation.  Hypertension. EXAM: CHEST - 2 VIEW COMPARISON:  March 18, 2014 FINDINGS: There is no edema or consolidation. Heart size and pulmonary vascularity are normal. No adenopathy. There is degenerative change in the thoracic spine. IMPRESSION: No edema or consolidation. Electronically Signed   By: Lowella Grip III M.D.   On: 02/27/2018 13:13    Labs: Recent Labs    03/06/18 0809  WBC 7.0  RBC 5.33  HCT 50.6  PLT 263   Recent Labs    03/06/18 0809  NA 139  K 3.8  CL 100  CO2 27  BUN 12  CREATININE 0.80  GLUCOSE 115*  CALCIUM 9.9   No results for input(s): LABPT, INR in the last 72 hours.  Review of Systems: ROS  Physical Exam: There is no height or weight on file to calculate BMI.  Physical  Exam  Constitutional: He is oriented to person, place, and time. He appears well-developed and well-nourished.  Cardiovascular: Normal rate and regular rhythm.  Respiratory: Effort normal and breath sounds normal.  GI: Soft. Bowel sounds are normal.  Neurological: He is alert and oriented to person, place, and time.  Skin: Skin is warm and dry.  Psychiatric: He has a normal mood and affect. His behavior is normal. Judgment and thought content normal.    tenderness to palpation of cervical paraspinals right greater than left flexion extension elicits severe pain as well as range of motion.  Sensation changes noted right upper extremity compared to the left motor deficits noted especially in the C5-6 dermatomal pattern.  Negative Hoffmann's no ambulatory deficits compartments are soft reflexes are symmetric.  Assessment and Plan: Risks and benefits of surgery were discussed with the patient. These include: Infection, bleeding, death, stroke, paralysis, ongoing or worse pain, need for additional surgery, nonunion, leak of spinal fluid, adjacent segment degeneration requiring additional fusion surgery. Pseudoarthrosis (nonunion)requiring supplemental posterior fixation. Throat pain, swallowing difficulties, hoarseness or change in voice.   With respect to disc replacement: Additional risks include heterotopic ossification, inability to place the disc due to technical issues requiring bailout to a fusion procedure.  [name], [name] for [name], MD [facility] [phone]  [name] clinical exam is essentially unchanged from his last office visit of 03/06/2018.  He continues to have significant neck and neuropathic left arm pain.  Patient did have preoperative ENT evaluation was cleared for left-sided approach.  Reviewed the surgical procedure with the patient and his wife and all their questions were addressed.  In addition we also discussed risks and benefits.  Surgical plan is an  ACDF C4-5 C5-6 with exploration of the C6/7 fusion and removal of hardware.

## 2018-03-09 NOTE — Progress Notes (Signed)
Anesthesia Chart Review:   Case:  [id number] Date/Time:  03/12/18 1315   Procedure:  ANTERIOR CERVICAL DECOMPRESSION/DISCECTOMY FUSION C4-6, EXPLORATION OF FUSION C6-7, POSSIBLE REMOVAL OF HARDWARE (N/A ) - 3.5 HRS   Anesthesia type:  General   Pre-op diagnosis:  Foraminal stenosis with radiculopathy   Location:  [facility] ROOM 04 / [facility]   Surgeon:  [name], MD      DISCUSSION: - Pt is a 57 year old male with hx CAD (mild by 2013 cath), HTN, alcohol use.    VS: BP 140/88   Pulse 87   Temp 36.6 C (Oral)   Resp 18   Ht 5\' 8"  (1.727 m)   Wt 247 lb 11.2 oz (112.4 kg)   SpO2 98%   BMI 37.66 kg/m    PROVIDERS: PCP is Ria Bush, MD who cleared pt for surgery at last office visit 02/27/18   LABS: Labs reviewed: Acceptable for surgery. (all labs ordered are listed, but only abnormal results are displayed)  Labs Reviewed  COMPREHENSIVE METABOLIC PANEL - Abnormal; Notable for the following components:      Result Value   Glucose, Bld 115 (*)    ALT 70 (*)    All other components within normal limits  SURGICAL PCR SCREEN  CBC     IMAGES:  CXR 02/27/18: No edema or consolidation   EKG 02/27/18: Sinus  Rhythm. RSR(V1) -nondiagnostic.    CV:  Echo 12/03/13:  - Technically difficult study due to body habitus.  - Mildly dilated LA. LV, RA, RV, and aorta appear normal in size.  - Normal LV systolic function. Normal wall motion. Moderate LVH with GRADE 1 diastolic dysfunction.  - Trace tricuspid regurgitation - Normal PA pressure - Trace mitral regurgitation - No pericardial effusion  Nuclear stress test 12/01/13:  - Equivocal stress test with normal EF  Cardiac cath 01/06/12:  1. LM: mid 30% stenosis 2. LAD: mid 30% stenosis 3. CX: mid 30% stenosis 4. RCA: mid 30% stenosis   Past Medical History:  Diagnosis Date  . Alcohol use    regularly 3 beers/day  . CAD (coronary artery disease)    mild  . Dyslipidemia   . GERD (gastroesophageal reflux disease)    with esophagitis  . History of Helicobacter pylori infection   . Hypertension, essential   . Obesity, Class I, BMI 30-34.9 01/19/2016    Past Surgical History:  Procedure Laterality Date  . ANTERIOR CERVICAL DECOMP/DISCECTOMY FUSION  2012   C4-5 [facility])  . CARDIAC CATHETERIZATION  2013   no significant CAD, nl LVEF 60% [name])  . COLONOSCOPY  2012   WNL per PCP report  . CT CTA CORONARY W/CA SCORE W/CM &/OR WO/CM  2015   minor luminal irregularities, Ca score = 90 [name])  . KNEE ARTHROSCOPY Bilateral     MEDICATIONS: . aspirin (ASPIRIN EC) 81 MG EC tablet  . atorvastatin (LIPITOR) 40 MG tablet  . chlorthalidone (HYGROTON) 25 MG tablet  . HYDROcodone-acetaminophen (NORCO/VICODIN) 5-325 MG tablet  . omeprazole (PRILOSEC) 20 MG capsule   No current facility-administered medications for this encounter.     If no changes, I anticipate pt can proceed with surgery as scheduled.   Willeen Cass, FNP-BC [facility]/Anesthesiology Phone: [phone] 03/09/2018 12:51 PM

## 2018-03-12 ENCOUNTER — Observation Stay (HOSPITAL_COMMUNITY)
Admission: RE | Admit: 2018-03-12 | Discharge: 2018-03-13 | Disposition: A | Discharge status: Home / Self Care | Payer: 59 | Source: Ambulatory Visit | Attending: Orthopedic Surgery | Admitting: Orthopedic Surgery

## 2018-03-12 ENCOUNTER — Ambulatory Visit (HOSPITAL_COMMUNITY): Payer: 59 | Admitting: Emergency Medicine

## 2018-03-12 ENCOUNTER — Encounter (HOSPITAL_COMMUNITY)
Admission: RE | Disposition: A | Discharge status: Home / Self Care | Payer: Self-pay | Source: Ambulatory Visit | Attending: Orthopedic Surgery

## 2018-03-12 ENCOUNTER — Ambulatory Visit (HOSPITAL_COMMUNITY): Payer: 59

## 2018-03-12 ENCOUNTER — Other Ambulatory Visit: Payer: Self-pay

## 2018-03-12 ENCOUNTER — Encounter (HOSPITAL_COMMUNITY): Payer: Self-pay | Admitting: *Deleted

## 2018-03-12 DIAGNOSIS — I2510 Atherosclerotic heart disease of native coronary artery without angina pectoris: Secondary | ICD-10-CM | POA: Diagnosis not present

## 2018-03-12 DIAGNOSIS — E669 Obesity, unspecified: Secondary | ICD-10-CM | POA: Diagnosis not present

## 2018-03-12 DIAGNOSIS — E785 Hyperlipidemia, unspecified: Secondary | ICD-10-CM | POA: Insufficient documentation

## 2018-03-12 DIAGNOSIS — Z472 Encounter for removal of internal fixation device: Secondary | ICD-10-CM | POA: Diagnosis not present

## 2018-03-12 DIAGNOSIS — M4802 Spinal stenosis, cervical region: Secondary | ICD-10-CM | POA: Insufficient documentation

## 2018-03-12 DIAGNOSIS — M50121 Cervical disc disorder at C4-C5 level with radiculopathy: Principal | ICD-10-CM | POA: Insufficient documentation

## 2018-03-12 DIAGNOSIS — Z6837 Body mass index (BMI) 37.0-37.9, adult: Secondary | ICD-10-CM | POA: Diagnosis not present

## 2018-03-12 DIAGNOSIS — K219 Gastro-esophageal reflux disease without esophagitis: Secondary | ICD-10-CM | POA: Insufficient documentation

## 2018-03-12 DIAGNOSIS — Z8619 Personal history of other infectious and parasitic diseases: Secondary | ICD-10-CM | POA: Diagnosis not present

## 2018-03-12 DIAGNOSIS — Z79899 Other long term (current) drug therapy: Secondary | ICD-10-CM | POA: Diagnosis not present

## 2018-03-12 DIAGNOSIS — I10 Essential (primary) hypertension: Secondary | ICD-10-CM | POA: Diagnosis not present

## 2018-03-12 DIAGNOSIS — M50122 Cervical disc disorder at C5-C6 level with radiculopathy: Secondary | ICD-10-CM | POA: Diagnosis not present

## 2018-03-12 DIAGNOSIS — Z981 Arthrodesis status: Secondary | ICD-10-CM | POA: Diagnosis not present

## 2018-03-12 DIAGNOSIS — Z7982 Long term (current) use of aspirin: Secondary | ICD-10-CM | POA: Diagnosis not present

## 2018-03-12 DIAGNOSIS — M5412 Radiculopathy, cervical region: Secondary | ICD-10-CM | POA: Diagnosis not present

## 2018-03-12 DIAGNOSIS — Z419 Encounter for procedure for purposes other than remedying health state, unspecified: Secondary | ICD-10-CM

## 2018-03-12 HISTORY — PX: ANTERIOR CERVICAL DECOMP/DISCECTOMY FUSION: SHX1161

## 2018-03-12 SURGERY — ANTERIOR CERVICAL DECOMPRESSION/DISCECTOMY FUSION 2 LEVELS
Anesthesia: General

## 2018-03-12 MED ORDER — POLYETHYLENE GLYCOL 3350 17 G PO PACK: 17.0000 g | PACK | Freq: Every day | ORAL | Status: DC | PRN

## 2018-03-12 MED ORDER — 0.9 % SODIUM CHLORIDE (POUR BTL) OPTIME
TOPICAL | Status: DC | PRN
  Administered 2018-03-12 (×4): 1000 mL

## 2018-03-12 MED ORDER — PHENOL 1.4 % MT LIQD: 1.0000 | OROMUCOSAL | Status: DC | PRN

## 2018-03-12 MED ORDER — OXYCODONE HCL 5 MG PO TABS
5.0000 mg | ORAL_TABLET | Freq: Once | ORAL | Status: AC | PRN
  Administered 2018-03-12: 5 mg via ORAL

## 2018-03-12 MED ORDER — ONDANSETRON HCL 4 MG/2ML IJ SOLN
INTRAMUSCULAR | Status: AC
  Filled 2018-03-12: qty 4

## 2018-03-12 MED ORDER — ESMOLOL HCL 100 MG/10ML IV SOLN
INTRAVENOUS | Status: DC | PRN
  Administered 2018-03-12 (×3): 20 mg via INTRAVENOUS

## 2018-03-12 MED ORDER — ACETAMINOPHEN 650 MG RE SUPP: 650.0000 mg | RECTAL | Status: DC | PRN

## 2018-03-12 MED ORDER — MIDAZOLAM HCL 2 MG/2ML IJ SOLN
INTRAMUSCULAR | Status: AC
  Filled 2018-03-12: qty 2

## 2018-03-12 MED ORDER — SODIUM CHLORIDE 0.9 % IV SOLN: 250.0000 mL | INTRAVENOUS | Status: DC

## 2018-03-12 MED ORDER — PANTOPRAZOLE SODIUM 40 MG PO TBEC
40.0000 mg | DELAYED_RELEASE_TABLET | Freq: Every day | ORAL | Status: DC
  Administered 2018-03-13: 40 mg via ORAL
  Filled 2018-03-12: qty 1

## 2018-03-12 MED ORDER — LIDOCAINE 2% (20 MG/ML) 5 ML SYRINGE
INTRAMUSCULAR | Status: DC | PRN
  Administered 2018-03-12: 100 mg via INTRAVENOUS

## 2018-03-12 MED ORDER — ROCURONIUM BROMIDE 10 MG/ML (PF) SYRINGE
PREFILLED_SYRINGE | INTRAVENOUS | Status: AC
  Filled 2018-03-12: qty 10

## 2018-03-12 MED ORDER — SUGAMMADEX SODIUM 200 MG/2ML IV SOLN
INTRAVENOUS | Status: AC
  Filled 2018-03-12: qty 2

## 2018-03-12 MED ORDER — ESMOLOL HCL 100 MG/10ML IV SOLN: INTRAVENOUS | Status: DC | PRN

## 2018-03-12 MED ORDER — ONDANSETRON HCL 4 MG/2ML IJ SOLN: 4.0000 mg | Freq: Four times a day (QID) | INTRAMUSCULAR | Status: DC | PRN

## 2018-03-12 MED ORDER — LACTATED RINGERS IV SOLN
INTRAVENOUS | Status: DC
  Administered 2018-03-12: 19:00:00 via INTRAVENOUS

## 2018-03-12 MED ORDER — FENTANYL CITRATE (PF) 250 MCG/5ML IJ SOLN
INTRAMUSCULAR | Status: AC
  Filled 2018-03-12: qty 5

## 2018-03-12 MED ORDER — METOPROLOL TARTRATE 5 MG/5ML IV SOLN
INTRAVENOUS | Status: DC | PRN
  Administered 2018-03-12 (×3): 1 mg via INTRAVENOUS

## 2018-03-12 MED ORDER — ROCURONIUM BROMIDE 100 MG/10ML IV SOLN
INTRAVENOUS | Status: DC | PRN
  Administered 2018-03-12 (×3): 20 mg via INTRAVENOUS
  Administered 2018-03-12: 50 mg via INTRAVENOUS
  Administered 2018-03-12: 30 mg via INTRAVENOUS

## 2018-03-12 MED ORDER — ONDANSETRON HCL 4 MG/2ML IJ SOLN: 4.0000 mg | Freq: Once | INTRAMUSCULAR | Status: DC | PRN

## 2018-03-12 MED ORDER — DOCUSATE SODIUM 100 MG PO CAPS
100.0000 mg | ORAL_CAPSULE | Freq: Two times a day (BID) | ORAL | Status: DC
  Administered 2018-03-12 – 2018-03-13 (×2): 100 mg via ORAL
  Filled 2018-03-12 (×2): qty 1

## 2018-03-12 MED ORDER — HYDROCODONE-ACETAMINOPHEN 10-325 MG PO TABS
1.0000 | ORAL_TABLET | ORAL | Status: DC | PRN
  Administered 2018-03-13: 1 via ORAL
  Filled 2018-03-12: qty 1

## 2018-03-12 MED ORDER — THROMBIN 20000 UNITS EX SOLR
CUTANEOUS | Status: DC | PRN
  Administered 2018-03-12: 13:00:00 via TOPICAL

## 2018-03-12 MED ORDER — METHOCARBAMOL 500 MG PO TABS
ORAL_TABLET | ORAL | Status: AC
  Filled 2018-03-12: qty 1

## 2018-03-12 MED ORDER — CEFAZOLIN SODIUM-DEXTROSE 2-4 GM/100ML-% IV SOLN
2.0000 g | INTRAVENOUS | Status: AC
  Administered 2018-03-12: 2 g via INTRAVENOUS
  Filled 2018-03-12: qty 100

## 2018-03-12 MED ORDER — ONDANSETRON 4 MG PO TBDP: 4.0000 mg | ORAL_TABLET | Freq: Three times a day (TID) | ORAL | 0 refills | Status: DC | PRN

## 2018-03-12 MED ORDER — MENTHOL 3 MG MT LOZG: 1.0000 | LOZENGE | OROMUCOSAL | Status: DC | PRN

## 2018-03-12 MED ORDER — OXYCODONE HCL 5 MG PO TABS: 5.0000 mg | ORAL_TABLET | ORAL | 0 refills | Status: DC | PRN

## 2018-03-12 MED ORDER — SODIUM CHLORIDE 0.9% FLUSH: 3.0000 mL | INTRAVENOUS | Status: DC | PRN

## 2018-03-12 MED ORDER — HYDROMORPHONE HCL 1 MG/ML IJ SOLN
0.2500 mg | INTRAMUSCULAR | Status: DC | PRN
  Administered 2018-03-12 (×2): 0.5 mg via INTRAVENOUS

## 2018-03-12 MED ORDER — ACETAMINOPHEN 10 MG/ML IV SOLN
INTRAVENOUS | Status: AC
  Filled 2018-03-12: qty 100

## 2018-03-12 MED ORDER — METHOCARBAMOL 500 MG PO TABS
500.0000 mg | ORAL_TABLET | Freq: Four times a day (QID) | ORAL | Status: DC | PRN
  Administered 2018-03-12 – 2018-03-13 (×4): 500 mg via ORAL
  Filled 2018-03-12 (×3): qty 1

## 2018-03-12 MED ORDER — METOPROLOL TARTRATE 5 MG/5ML IV SOLN
INTRAVENOUS | Status: AC
  Filled 2018-03-12: qty 5

## 2018-03-12 MED ORDER — THROMBIN 20000 UNITS EX KIT
PACK | CUTANEOUS | Status: AC
  Filled 2018-03-12: qty 1

## 2018-03-12 MED ORDER — HEMOSTATIC AGENTS (NO CHARGE) OPTIME
TOPICAL | Status: DC | PRN
  Administered 2018-03-12: 1 via TOPICAL

## 2018-03-12 MED ORDER — DEXTROSE 5 % IV SOLN
500.0000 mg | Freq: Four times a day (QID) | INTRAVENOUS | Status: DC | PRN
  Filled 2018-03-12: qty 5

## 2018-03-12 MED ORDER — OXYCODONE HCL 5 MG PO TABS
5.0000 mg | ORAL_TABLET | ORAL | Status: DC | PRN
  Administered 2018-03-12 – 2018-03-13 (×4): 5 mg via ORAL
  Filled 2018-03-12 (×4): qty 1

## 2018-03-12 MED ORDER — MAGNESIUM CITRATE PO SOLN: 1.0000 | Freq: Once | ORAL | Status: DC | PRN

## 2018-03-12 MED ORDER — MIDAZOLAM HCL 5 MG/5ML IJ SOLN: INTRAMUSCULAR | Status: DC | PRN

## 2018-03-12 MED ORDER — FENTANYL CITRATE (PF) 100 MCG/2ML IJ SOLN
INTRAMUSCULAR | Status: DC | PRN
  Administered 2018-03-12 (×3): 100 ug via INTRAVENOUS
  Administered 2018-03-12 (×2): 50 ug via INTRAVENOUS

## 2018-03-12 MED ORDER — LIDOCAINE 2% (20 MG/ML) 5 ML SYRINGE
INTRAMUSCULAR | Status: AC
  Filled 2018-03-12: qty 5

## 2018-03-12 MED ORDER — ACETAMINOPHEN 10 MG/ML IV SOLN
INTRAVENOUS | Status: DC | PRN
  Administered 2018-03-12: 1000 mg via INTRAVENOUS

## 2018-03-12 MED ORDER — ESMOLOL HCL 100 MG/10ML IV SOLN
INTRAVENOUS | Status: AC
  Filled 2018-03-12: qty 10

## 2018-03-12 MED ORDER — CEFAZOLIN SODIUM-DEXTROSE 2-4 GM/100ML-% IV SOLN
2.0000 g | Freq: Three times a day (TID) | INTRAVENOUS | Status: AC
  Administered 2018-03-12 – 2018-03-13 (×2): 2 g via INTRAVENOUS
  Filled 2018-03-12 (×2): qty 100

## 2018-03-12 MED ORDER — ACETAMINOPHEN 325 MG PO TABS
650.0000 mg | ORAL_TABLET | ORAL | Status: DC | PRN
  Administered 2018-03-12 – 2018-03-13 (×2): 650 mg via ORAL
  Filled 2018-03-12 (×2): qty 2

## 2018-03-12 MED ORDER — OXYCODONE HCL 5 MG PO TABS
ORAL_TABLET | ORAL | Status: AC
  Filled 2018-03-12: qty 1

## 2018-03-12 MED ORDER — ONDANSETRON HCL 4 MG/2ML IJ SOLN
INTRAMUSCULAR | Status: DC | PRN
  Administered 2018-03-12 (×2): 4 mg via INTRAVENOUS

## 2018-03-12 MED ORDER — METHOCARBAMOL 500 MG PO TABS: 500.0000 mg | ORAL_TABLET | Freq: Three times a day (TID) | ORAL | 0 refills | Status: DC

## 2018-03-12 MED ORDER — ONDANSETRON HCL 4 MG PO TABS: 4.0000 mg | ORAL_TABLET | Freq: Four times a day (QID) | ORAL | Status: DC | PRN

## 2018-03-12 MED ORDER — LACTATED RINGERS IV SOLN
INTRAVENOUS | Status: DC
  Administered 2018-03-12 (×3): via INTRAVENOUS

## 2018-03-12 MED ORDER — MORPHINE SULFATE (PF) 2 MG/ML IV SOLN
1.0000 mg | INTRAVENOUS | Status: DC | PRN
  Filled 2018-03-12: qty 1

## 2018-03-12 MED ORDER — ATORVASTATIN CALCIUM 20 MG PO TABS
40.0000 mg | ORAL_TABLET | Freq: Every day | ORAL | Status: DC
  Administered 2018-03-13: 40 mg via ORAL
  Filled 2018-03-12: qty 2

## 2018-03-12 MED ORDER — BUPIVACAINE-EPINEPHRINE (PF) 0.25% -1:200000 IJ SOLN
INTRAMUSCULAR | Status: AC
  Filled 2018-03-12: qty 30

## 2018-03-12 MED ORDER — PROPOFOL 10 MG/ML IV BOLUS
INTRAVENOUS | Status: DC | PRN
  Administered 2018-03-12: 20 mg via INTRAVENOUS
  Administered 2018-03-12: 180 mg via INTRAVENOUS

## 2018-03-12 MED ORDER — HYDROMORPHONE HCL 1 MG/ML IJ SOLN
INTRAMUSCULAR | Status: AC
  Filled 2018-03-12: qty 1

## 2018-03-12 MED ORDER — DEXAMETHASONE SODIUM PHOSPHATE 10 MG/ML IJ SOLN
INTRAMUSCULAR | Status: DC | PRN
  Administered 2018-03-12: 10 mg via INTRAVENOUS

## 2018-03-12 MED ORDER — MIDAZOLAM HCL 5 MG/5ML IJ SOLN
INTRAMUSCULAR | Status: DC | PRN
  Administered 2018-03-12: 2 mg via INTRAVENOUS

## 2018-03-12 MED ORDER — OXYCODONE HCL 5 MG/5ML PO SOLN: 5.0000 mg | Freq: Once | ORAL | Status: AC | PRN

## 2018-03-12 MED ORDER — BUPIVACAINE-EPINEPHRINE 0.25% -1:200000 IJ SOLN
INTRAMUSCULAR | Status: DC | PRN
  Administered 2018-03-12: 10 mL

## 2018-03-12 MED ORDER — PROPOFOL 10 MG/ML IV BOLUS
INTRAVENOUS | Status: AC
  Filled 2018-03-12: qty 20

## 2018-03-12 MED ORDER — SODIUM CHLORIDE 0.9% FLUSH: 3.0000 mL | Freq: Two times a day (BID) | INTRAVENOUS | Status: DC

## 2018-03-12 MED ORDER — SUGAMMADEX SODIUM 200 MG/2ML IV SOLN
INTRAVENOUS | Status: DC | PRN
  Administered 2018-03-12: 200 mg via INTRAVENOUS

## 2018-03-12 SURGICAL SUPPLY — 65 items
BLADE CLIPPER SURG (BLADE) IMPLANT
BONE VIVIGEN FORMABLE 1.3CC (Bone Implant) ×4 IMPLANT
BUR EGG ELITE 4.0 (BURR) IMPLANT
BUR MATCHSTICK NEURO 3.0 LAGG (BURR) IMPLANT
CABLE BIPOLOR RESECTION CORD (MISCELLANEOUS) ×2 IMPLANT
CANISTER SUCT 3000ML PPV (MISCELLANEOUS) ×2 IMPLANT
CLSR STERI-STRIP ANTIMIC 1/2X4 (GAUZE/BANDAGES/DRESSINGS) ×2 IMPLANT
COVER MAYO STAND STRL (DRAPES) ×6 IMPLANT
COVER SURGICAL LIGHT HANDLE (MISCELLANEOUS) ×4 IMPLANT
CRADLE DONUT ADULT HEAD (MISCELLANEOUS) ×2 IMPLANT
DEVICE FUSION NANLCK 8MM 6DEG (Cage) ×2 IMPLANT
DRAIN TLS ROUND 10FR (DRAIN) ×2 IMPLANT
DRAPE C-ARM 42X72 X-RAY (DRAPES) ×2 IMPLANT
DRAPE MICROSCOPE LEICA 46X105 (MISCELLANEOUS) IMPLANT
DRAPE POUCH INSTRU U-SHP 10X18 (DRAPES) ×2 IMPLANT
DRAPE SURG 17X23 STRL (DRAPES) ×2 IMPLANT
DRAPE U-SHAPE 47X51 STRL (DRAPES) ×2 IMPLANT
DRSG OPSITE 4X5.5 SM (GAUZE/BANDAGES/DRESSINGS) ×2 IMPLANT
DRSG OPSITE POSTOP 3X4 (GAUZE/BANDAGES/DRESSINGS) ×2 IMPLANT
DURAPREP 26ML APPLICATOR (WOUND CARE) ×2 IMPLANT
ELECT COATED BLADE 2.86 ST (ELECTRODE) ×2 IMPLANT
ELECT PENCIL ROCKER SW 15FT (MISCELLANEOUS) ×2 IMPLANT
ELECT REM PT RETURN 9FT ADLT (ELECTROSURGICAL) ×2
ELECTRODE REM PT RTRN 9FT ADLT (ELECTROSURGICAL) ×1 IMPLANT
FLOSEAL 10ML (HEMOSTASIS) IMPLANT
FLOSEAL 5ML (HEMOSTASIS) ×2 IMPLANT
FUSION TCS NANOLOCK 8MM 6DEG (Cage) ×4 IMPLANT
GLOVE BIO SURGEON STRL SZ 6.5 (GLOVE) ×2 IMPLANT
GLOVE BIOGEL PI IND STRL 6.5 (GLOVE) ×1 IMPLANT
GLOVE BIOGEL PI IND STRL 8.5 (GLOVE) ×1 IMPLANT
GLOVE BIOGEL PI INDICATOR 6.5 (GLOVE) ×1
GLOVE BIOGEL PI INDICATOR 8.5 (GLOVE) ×1
GLOVE SS BIOGEL STRL SZ 8.5 (GLOVE) ×1 IMPLANT
GLOVE SUPERSENSE BIOGEL SZ 8.5 (GLOVE) ×1
GOWN STRL REUS W/ TWL LRG LVL3 (GOWN DISPOSABLE) ×1 IMPLANT
GOWN STRL REUS W/TWL 2XL LVL3 (GOWN DISPOSABLE) ×2 IMPLANT
GOWN STRL REUS W/TWL LRG LVL3 (GOWN DISPOSABLE) ×1
KIT BASIN OR (CUSTOM PROCEDURE TRAY) ×2 IMPLANT
KIT TURNOVER KIT B (KITS) ×2 IMPLANT
NEEDLE HYPO 22GX1.5 SAFETY (NEEDLE) ×2 IMPLANT
NEEDLE SPNL 18GX3.5 QUINCKE PK (NEEDLE) ×2 IMPLANT
NS IRRIG 1000ML POUR BTL (IV SOLUTION) ×8 IMPLANT
PACK ORTHO CERVICAL (CUSTOM PROCEDURE TRAY) ×2 IMPLANT
PACK UNIVERSAL I (CUSTOM PROCEDURE TRAY) ×2 IMPLANT
PAD ARMBOARD 7.5X6 YLW CONV (MISCELLANEOUS) ×4 IMPLANT
PATTIES SURGICAL .25X.25 (GAUZE/BANDAGES/DRESSINGS) ×2 IMPLANT
RESTRAINT LIMB HOLDER UNIV (RESTRAINTS) ×2 IMPLANT
RUBBERBAND STERILE (MISCELLANEOUS) IMPLANT
SCREW ENDO BONE 3.8X14MM (Screw) ×8 IMPLANT
SPONGE INTESTINAL PEANUT (DISPOSABLE) ×2 IMPLANT
SPONGE LAP 4X18 RFD (DISPOSABLE) ×4 IMPLANT
SPONGE SURGIFOAM ABS GEL 100 (HEMOSTASIS) ×2 IMPLANT
SUT BONE WAX W31G (SUTURE) ×2 IMPLANT
SUT MON AB 3-0 SH 27 (SUTURE) ×1
SUT MON AB 3-0 SH27 (SUTURE) ×1 IMPLANT
SUT SILK 2 0 (SUTURE)
SUT SILK 2-0 18XBRD TIE 12 (SUTURE) IMPLANT
SUT VIC AB 2-0 CT1 18 (SUTURE) ×2 IMPLANT
SYR BULB IRRIGATION 50ML (SYRINGE) ×2 IMPLANT
SYR CONTROL 10ML LL (SYRINGE) ×2 IMPLANT
TAPE CLOTH 4X10 WHT NS (GAUZE/BANDAGES/DRESSINGS) ×2 IMPLANT
TAPE UMBILICAL COTTON 1/8X30 (MISCELLANEOUS) ×2 IMPLANT
TOWEL GREEN STERILE (TOWEL DISPOSABLE) ×2 IMPLANT
TOWEL GREEN STERILE FF (TOWEL DISPOSABLE) ×2 IMPLANT
WATER STERILE IRR 1000ML POUR (IV SOLUTION) ×2 IMPLANT

## 2018-03-12 NOTE — Discharge Instructions (Signed)
Cervical Fusion, Care After °This sheet gives you information about how to care for yourself after your procedure. Your health care provider may also give you more specific instructions. If you have problems or questions, contact your health care provider. °What can I expect after the procedure? °After the procedure, it is common to have: °· Incision area pain. °· Numbness. °· Weakness. °· Sore throat. °· Difficulty swallowing. ° °Follow these instructions at home: °Medicines °· Take over-the-counter and prescription medicines, including pain medicines, only as told by your health care provider. °· If you were prescribed an antibiotic medicine, take it as told by your health care provider. Do not stop taking the antibiotic even if you start to feel better. °If you have a brace: °· Wear the brace as told by your health care provider. Remove it only as told by your health care provider. °· Keep the brace clean. °Incision care °· Follow instructions from your health care provider about how to take care of your incision. Make sure you: °? Wash your hands with soap and water before you change your bandage (dressing). If soap and water are not available, use hand sanitizer. °? Change your dressing as told by your health care provider. °? Leave stitches (sutures), skin glue, or adhesive strips in place. These skin closures may need to be in place for 2 weeks or longer. If adhesive strip edges start to loosen and curl up, you may trim the loose edges. Do not remove adhesive strips completely unless your health care provider tells you to do that. °· Keep your incision clean and dry. Do not take baths, swim, or use a hot tub until your health care provider approves. °· Check your incision area every day for signs of infection. Check for: °? More redness, swelling, or pain. °? More fluid or blood. °? Warmth. °? Pus or a bad smell. °Activity ° °· Rest and protect your back as much as possible. °· Do not lift anything that is  heavier than 10 lb (4.5 kg) or the limit that you are told by your health care provider. °· Do not twist or bend at the waist until your health care provider approves. °· Avoid: °? Pushing and pulling motions. °? Lifting anything over your head. °? Sitting or lying down in the same position for long periods of time. °· Do not exercise until your health care provider approves. Once your health care provider has approved exercise, ask him or her what kinds of exercises you can do to make your back stronger (physical therapy). °· Do not drive until your health care provider approves. °? Do not drive for 24 hours if you received a medicine to help you relax (sedative). °? Do not drive or use heavy machinery while taking prescription pain medicine. °General instructions ° °· Have someone assist you to turn in bed frequently by moving your whole body without twisting your back (log rolling technique). °· Wear compression stockings as told by your health care provider. These stockings help to prevent blood clots and reduce swelling in your legs. °· Do not use any products that contain nicotine or tobacco, such as cigarettes and e-cigarettes. These can delay bone healing. If you need help quitting, ask your health care provider. °· To prevent or treat constipation while you are taking prescription pain medicine, your health care provider may recommend that you: °? Drink enough fluid to keep your urine clear or pale yellow. °? Take over-the-counter or prescription medicines. °? Eat foods   that are high in fiber, such as fresh fruits and vegetables, whole grains, and beans. °? Limit foods that are high in fat and processed sugars, such as fried and sweet foods. °· Keep all follow-up visits as told by your health care provider. This is important. °Contact a health care provider if: °· You have pain that gets worse or does not get better with medicine. °· You have more redness, swelling, or pain around your incision. °· You have  more fluid or blood coming from your incision. °· Your incision feels warm to the touch. °· You have pus or a bad smell coming from your incision. °· You have a fever. °· You have weakness or numbness in your legs that is new or getting worse. °· You have swelling in your calf or leg. °· The edges of your incision break open. °· You vomit or feel nauseous. °· You have trouble controlling urination or bowel movements. °Get help right away if: °· You develop shortness of breath or chest pain. °· You have trouble swallowing. °· You have trouble breathing. °· You develop a cough. °This information is not intended to replace advice given to you by your health care provider. Make sure you discuss any questions you have with your health care provider. °Document Released: 03/26/2004 Document Revised: 03/06/2016 Document Reviewed: 02/21/2016 °Elsevier Interactive Patient Education © 2018 [employer]. ° ° ° ° ° ° °

## 2018-03-12 NOTE — Op Note (Signed)
Operative report  Preoperative diagnosis: Previous ACDF C6-7 with adjacent segment degenerative foraminal stenosis with radiculopathy C4-6.  Postoperative diagnosis: Same  Operative procedure: 1.  Anterior cervical discectomy and fusion C4-6.    2.  Removal of cervical hardware and exploration of fusion C6-7  First assistant: [name], PA  Complications: None  Implant system used: Titan nano lock 0 profile intervertebral cages.  8 mm medium lordotic packed with allograft (vivogen).  Indications: Very pleasant 57 year old gentleman in 2011 underwent an ACDF at C6-7.  He is done well until recently when he started having increasing radicular arm pain.  Despite appropriate conservative care his quality of life continued to deteriorate and we elected to move forward with surgery.  All appropriate risks benefits and alternatives were discussed with the patient and consent was obtained.  Operative note: Patient was brought the operating room placed supine the operating room table.  After successful induction of general anesthesia and endotracheal ablation teds SCDs and a Foley were inserted.  The anterior cervical spine was then prepped and draped in a standard fashion.  Timeout was taken to confirm patient procedure and all other important data.  Because of the patient's body habitus I elected to use a standard Smith-Robinson approach through a longitudinal incision on the left side.  I marked that the C4 level in the C6 level and then mapped out my incision in the left side of the neck.  I infiltrated this with Marcaine and made my longitudinal incision.  Sharp dissection was carried down to the platysma the platysma was sharply incised in line with the skin incision and I continued sharply dissecting through the deep cervical fascia.  I identified and sacrificed the omohyoid for visualization.  I continued my dissection through the deep cervical and prevertebral fascia until I could palpate and  visualize the anterior aspect of cervical spine.  The carotid sheath was visualized laterally and protected with a finger esophagus and trachea was swept to the right and protected with a retractor.  I then identified the C6-7 plate and using Kitner dissectors mobilize the remaining soft tissue fascia over this to completely expose this level.  Using the appropriate size hex driver I remove the screws as in the plate.  This occurred without incident.  I then evaluated the intervertebral space.  There was solid bridging bone noted at the 6 7 disc space level.  The screw holes were then sealed with bone wax and I then mobilized the longus coli muscles from the mid body C6 to the mid body of C4 bilaterally.  Self-retaining retractor was placed I deflated the endotracheal cuff and expanded the retractor to the appropriate with and then reinflated the endotracheal cuff.  Double-action Leksell Francee Piccolo was used to remove the anterior exostosis at C5-6.  Annulotomy was performed with a 15 blade scalpel.  Pituitary rongeurs were used to remove the bulk of the disc material.  2 mm Kerrison Francee Piccolo was used to take down the overhanging osteophyte from the inferior aspect of the C5 vertebral body.  Distraction pins were then placed into the body of C5 and C6 and I gently distracted the intervertebral space and maintain this distraction with the pin set.  Using curettes and pituitary rongeurs I removed all the remaining disc material.  Using a fine nerve hook and curette I developed a plane underneath the posterior annulus and resected this along with posterior longitudinal ligament with a 1 mm Kerrison rongeur.  I had a complete discectomy and decompression.  I was able to get under the uncovertebral joint and resected osteophytes.  At this point had excellent decompression.  I rasped the endplates and then used the trial spacers.  I elected to use the size 8 medium lordotic cage is a provided the best fit.  Cage was obtained and  packed with allograft and then malleted to the appropriate depth.  I had excellent fixation with this cage.  A single locking screw 3.8 diameter 14 mm length was used to pass through the cage and into the C6 vertebral body a second was placed into the C5 vertebral body at this point the intervertebral cage was secured to the vertebral bodies.  I then remove reposition by retractors to the C4-5 level.  Using the same technique I performed a discectomy at this level.  Again I took down the posterior longitudinal ligament with a 1 mm Kerrison and made sure an adequate decompression under the uncovertebral joints.  I rasped the endplates a bleeding subchondral bone and placed the same size implant at this level.  Same size locking screws were used.  This point all 4 screws were tested and they were secured.  The distraction pins were removed and the bleeding bone edges were sealed with bone wax.  At this point I irrigated the wound copiously normal saline and used bipolar cautery and FloSeal to obtain and maintain hemostasis.  Because of the patient's body habitus I did place a drain which was brought out of a separate stab incision.  I did have hemostasis at the time of closing.  I then reapproximated the platysma with interrupted 2-0 Vicryl suture.  At 3-0 Monocryl for the skin.  Steri-Strips and a dry dressing were applied.  The patient was ultimately extubated and transferred to the PACU without incident.  The end of the case all needle sponge counts were correct.

## 2018-03-12 NOTE — Anesthesia Postprocedure Evaluation (Signed)
Anesthesia Post Note  Patient: [name] [name]  Procedure(s) Performed: ANTERIOR CERVICAL DECOMPRESSION/DISCECTOMY FUSION C4-6, EXPLORATION OF FUSION C6-7, REMOVAL OF HARDWARE (N/A )     Patient location during evaluation: PACU Anesthesia Type: General Level of consciousness: awake and alert Pain management: pain level controlled Vital Signs Assessment: post-procedure vital signs reviewed and stable Respiratory status: spontaneous breathing, nonlabored ventilation, respiratory function stable and patient connected to nasal cannula oxygen Cardiovascular status: blood pressure returned to baseline and stable Postop Assessment: no apparent nausea or vomiting Anesthetic complications: no    Last Vitals:  Vitals:   03/12/18 1804 03/12/18 1930  BP: (!) 159/93 (!) 149/94  Pulse: (!) 103 96  Resp: 18 18  Temp: 37.1 C 36.4 C  SpO2: 95% 94%    Last Pain:  Vitals:   03/12/18 2051  TempSrc:   PainSc: 8                  [name] COKER

## 2018-03-12 NOTE — Transfer of Care (Signed)
Immediate Anesthesia Transfer of Care Note  Patient: [name] [name]  Procedure(s) Performed: ANTERIOR CERVICAL DECOMPRESSION/DISCECTOMY FUSION C4-6, EXPLORATION OF FUSION C6-7, REMOVAL OF HARDWARE (N/A )  Patient Location: PACU  Anesthesia Type:General  Level of Consciousness: awake and alert   Airway & Oxygen Therapy: Patient Spontanous Breathing and Patient connected to face mask oxygen  Post-op Assessment: Report given to RN and Post -op Vital signs reviewed and stable  Post vital signs: Reviewed and stable  Last Vitals:  Vitals Value Taken Time  BP 166/100 03/12/2018  4:34 PM  Temp    Pulse 106 03/12/2018  4:36 PM  Resp 27 03/12/2018  4:36 PM  SpO2 97 % 03/12/2018  4:36 PM  Vitals shown include unvalidated device data.  Last Pain:  Vitals:   03/12/18 1056  TempSrc: Oral         Complications: No apparent anesthesia complications

## 2018-03-12 NOTE — Anesthesia Preprocedure Evaluation (Signed)
Anesthesia Evaluation  Patient identified by MRN, date of birth, ID band Patient awake    Reviewed: Allergy & Precautions, NPO status , [name] Chart, lab work & pertinent test results  Airway Mallampati: II  TM Distance: >3 FB Neck ROM: Limited    Dental  (+) Teeth Intact, Dental Advisory Given   Pulmonary    breath sounds clear to auscultation       Cardiovascular hypertension,  Rhythm:Regular Rate:Normal     Neuro/Psych    GI/Hepatic   Endo/Other    Renal/GU      Musculoskeletal   Abdominal (+) + obese,   Peds  Hematology   Anesthesia Other Findings   Reproductive/Obstetrics                             Anesthesia Physical Anesthesia Plan  ASA: III  Anesthesia Plan: General   Post-op Pain Management:    Induction: Intravenous  PONV Risk Score and Plan: Ondansetron and Dexamethasone  Airway Management Planned: Oral ETT  Additional Equipment:   Intra-op Plan:   Post-operative Plan: Extubation in OR  Informed Consent: I have reviewed the patients History and Physical, chart, labs and discussed the procedure including the risks, benefits and alternatives for the proposed anesthesia with the patient or authorized representative who has indicated his/her understanding and acceptance.   Dental advisory given  Plan Discussed with: CRNA and Anesthesiologist  Anesthesia Plan Comments:         Anesthesia Quick Evaluation

## 2018-03-12 NOTE — Anesthesia Procedure Notes (Signed)
Procedure Name: Intubation Date/Time: 03/12/2018 1:23 PM Performed by: Gwyndolyn Saxon, CRNA Pre-anesthesia Checklist: Patient identified, Emergency Drugs available, Suction available and Patient being monitored Patient Re-evaluated:Patient Re-evaluated prior to induction Oxygen Delivery Method: Circle System Utilized Preoxygenation: Pre-oxygenation with 100% oxygen Induction Type: IV induction Ventilation: Oral airway inserted - appropriate to patient size and Two handed mask ventilation required Laryngoscope Size: Glidescope and 4 Grade View: Grade I Tube type: Oral Number of attempts: 1 Airway Equipment and Method: Stylet and Oral airway Placement Confirmation: ETT inserted through vocal cords under direct vision,  positive ETCO2 and breath sounds checked- equal and bilateral Secured at: 22 cm Tube secured with: Tape Dental Injury: Teeth and Oropharynx as per pre-operative assessment

## 2018-03-12 NOTE — Brief Op Note (Signed)
03/12/2018  4:31 PM  PATIENT:  [name] [name]  57 y.o. male  PRE-OPERATIVE DIAGNOSIS:  Foraminal stenosis with radiculopathy  POST-OPERATIVE DIAGNOSIS:  Foraminal stenosis with radiculopathy  PROCEDURE:  Procedure(s) with comments: ANTERIOR CERVICAL DECOMPRESSION/DISCECTOMY FUSION C4-6, EXPLORATION OF FUSION C6-7, REMOVAL OF HARDWARE (N/A) - 3.5 HRS  SURGEON:  Surgeon(s) and Role:    [name], MD - Primary  PHYSICIAN ASSISTANT:   ASSISTANTS: Carmen Mayo   ANESTHESIA:   general  EBL:  75 mL   BLOOD ADMINISTERED:none  DRAINS: (1) TLS Drain(s) to suction in the neck   LOCAL MEDICATIONS USED:  MARCAINE     SPECIMEN:  No Specimen  DISPOSITION OF SPECIMEN:  N/A  COUNTS:  YES  TOURNIQUET:  * No tourniquets in log *  DICTATION: .Dragon Dictation  PLAN OF CARE: Admit for overnight observation  PATIENT DISPOSITION:  PACU - hemodynamically stable.

## 2018-03-13 ENCOUNTER — Encounter (HOSPITAL_COMMUNITY): Payer: Self-pay | Admitting: Orthopedic Surgery

## 2018-03-13 DIAGNOSIS — M50121 Cervical disc disorder at C4-C5 level with radiculopathy: Secondary | ICD-10-CM | POA: Diagnosis not present

## 2018-03-13 MED ORDER — OXYCODONE-ACETAMINOPHEN 10-325 MG PO TABS: 1.0000 | ORAL_TABLET | ORAL | 0 refills | Status: AC | PRN

## 2018-03-13 NOTE — Progress Notes (Signed)
    Subjective: 1 Day Post-Op Procedure(s) (LRB): ANTERIOR CERVICAL DECOMPRESSION/DISCECTOMY FUSION C4-6, EXPLORATION OF FUSION C6-7, REMOVAL OF HARDWARE (N/A) Patient reports pain as 8 on 0-10 scale.   Denies CP or SOB.  Voiding without difficulty. Positive flatus. Objective: Vital signs in last 24 hours: Temp:  [97.3 F (36.3 C)-98.7 F (37.1 C)] 98.6 F (37 C) (07/19 0712) Pulse Rate:  [80-111] 81 (07/19 0712) Resp:  [2-25] 16 (07/19 0712) BP: (127-166)/(80-103) 127/84 (07/19 0712) SpO2:  [90 %-98 %] 92 % (07/19 0712) Weight:  [112 kg (247 lb)] 112 kg (247 lb) (07/18 1056)  Intake/Output from previous day: 07/18 0701 - 07/19 0700 In: 2439.3 [P.O.:120; I.V.:2219.3; IV Piggyback:100] Out: 155 [Urine:80; Blood:75] Intake/Output this shift: No intake/output data recorded.  Labs: No results for input(s): HGB in the last 72 hours. No results for input(s): WBC, RBC, HCT, PLT in the last 72 hours. No results for input(s): NA, K, CL, CO2, BUN, CREATININE, GLUCOSE, CALCIUM in the last 72 hours. No results for input(s): LABPT, INR in the last 72 hours.  Physical Exam: Neurologically intact ABD soft Sensation intact distally Dorsiflexion/Plantar flexion intact Incision: no drainage Compartment soft Body mass index is 37.56 kg/m.  No drainage from drain Aspen collar in place   Assessment/Plan: 1 Day Post-Op Procedure(s) (LRB): ANTERIOR CERVICAL DECOMPRESSION/DISCECTOMY FUSION C4-6, EXPLORATION OF FUSION C6-7, REMOVAL OF HARDWARE (N/A) Advance diet Up with therapy  Pt may DC after cleared by PT Meds on chart Drain will be pulled before DC  Mayo, Darla Lesches for Dr. [name] [facility] [phone] 03/13/2018, 7:34 AM

## 2018-03-13 NOTE — Evaluation (Signed)
Physical Therapy Evaluation Patient Details Name: [name] [name] MRN: [id number] DOB: 11/01/1960 Today's Date: 03/13/2018   History of Present Illness  Pt is a 57 y.o. M with significant PMH of CAD and previous cervical surgery who presents s/p anterior cervical decompression/discectomy fusion C4-6, exploration of fusion C6-7, and removal of hardware.  Clinical Impression  Patient evaluated by Physical Therapy with no further acute PT needs identified. Patient ambulating hallway distances with no assistive device without difficulty. Able to negotiate a flight of steps with left railing to simulate home environment. Main limitation is pain and right second finger numbness. Educated patient and patient wife on precautions with daily activities, car transfer, and positioning for comfort. All education has been completed and the patient has no further questions. No follow-up Physical Therapy or equipment needs. PT is signing off. Thank you for this referral.     Follow Up Recommendations No PT follow up    Equipment Recommendations  None recommended by PT    Recommendations for Other Services       Precautions / Restrictions Precautions Precautions: Cervical Precaution Booklet Issued: Yes (comment) Precaution Comments: reviewed precautions and provided written handout Required Braces or Orthoses: Cervical Brace Cervical Brace: Hard collar Restrictions Weight Bearing Restrictions: No      Mobility  Bed Mobility Overal bed mobility: Modified Independent             General bed mobility comments: good log roll technique  Transfers Overall transfer level: Independent                  Ambulation/Gait Ambulation/Gait assistance: Independent Gait Distance (Feet): 400 Feet Assistive device: None Gait Pattern/deviations: Step-through pattern;Wide base of support     General Gait Details: tends to ambulate with wide BOS and bilateral foot external rotation but this seems  to baseline. Good gait speed and posture throughout  Stairs Stairs: Yes Stairs assistance: Modified independent (Device/Increase time) Stair Management: One rail Left Number of Stairs: 12 General stair comments: Step over step pattern utilized  Wheelchair Mobility    Modified Rankin (Stroke Patients Only)       Balance Overall balance assessment: No apparent balance deficits (not formally assessed)                                           Pertinent Vitals/Pain Pain Assessment: 0-10 Pain Score: 8  Pain Location: surgical site Pain Descriptors / Indicators: Guarding;Discomfort Pain Intervention(s): Monitored during session;Premedicated before session    Home Living Family/patient expects to be discharged to:: Private residence Living Arrangements: Spouse/significant other Available Help at Discharge: Family;Available 24 hours/day Type of Home: House Home Access: Stairs to enter Entrance Stairs-Rails: Left Entrance Stairs-Number of Steps: 3 Home Layout: One level Home Equipment: None      Prior Function Level of Independence: Independent         Comments: Works in Multimedia programmer        Extremity/Trunk Assessment   Upper Extremity Assessment Upper Extremity Assessment: Defer to OT evaluation;RUE deficits/detail RUE Deficits / Details: Reports right index finger numbness    Lower Extremity Assessment Lower Extremity Assessment: Overall WFL for tasks assessed    Cervical / Trunk Assessment Cervical / Trunk Assessment: Normal  Communication   Communication: No difficulties  Cognition Arousal/Alertness: Awake/alert Behavior During Therapy: WFL for tasks assessed/performed Overall Cognitive Status: Within Functional  Limits for tasks assessed                                        General Comments      Exercises     Assessment/Plan    PT Assessment Patent does not need any further PT services   PT Problem List Pain;Decreased mobility       PT Treatment Interventions      PT Goals (Current goals can be found in the Care Plan section)  Acute Rehab PT Goals Patient Stated Goal: "go back to work in 3 months." PT Goal Formulation: All assessment and education complete, DC therapy    Frequency     Barriers to discharge        Co-evaluation               AM-[name] PT "6 Clicks" Daily Activity  Outcome Measure Difficulty turning over in bed (including adjusting bedclothes, sheets and blankets)?: None Difficulty moving from lying on back to sitting on the side of the bed? : A Little Difficulty sitting down on and standing up from a chair with arms (e.g., wheelchair, bedside commode, etc,.)?: None Help needed moving to and from a bed to chair (including a wheelchair)?: None Help needed walking in hospital room?: None Help needed climbing 3-5 steps with a railing? : None 6 Click Score: 23    End of Session Equipment Utilized During Treatment: Gait belt;Cervical collar Activity Tolerance: Patient tolerated treatment well Patient left: in bed;with call bell/phone within reach;with family/visitor present Nurse Communication: Mobility status PT Visit Diagnosis: Pain;Difficulty in walking, not elsewhere classified (R26.2) Pain - part of body: (neck)    Time: 4859-2763 PT Time Calculation (min) (ACUTE ONLY): 16 min   Charges:   PT Evaluation $PT Eval Low Complexity: 1 Low     PT G Codes:       Ellamae Sia, PT, DPT Acute Rehabilitation Services  Pager: [place] 03/13/2018, 9:13 AM

## 2018-03-13 NOTE — Progress Notes (Signed)
Patient is discharged from room 3C10 at this time. Alert and in stable condition. IV site d/c'd and instructions read to patient and spouse with understanding verbalized. Left unit via wheelchair with all belongings at side.  

## 2018-03-13 NOTE — Clinical Social Work Note (Signed)
CSW acknowledges SNF consult. PT and OT recommended no follow up. Patient has orders to discharge home today.  CSW signing off.   Dayton Scrape, [place]

## 2018-03-13 NOTE — Evaluation (Signed)
Occupational Therapy Evaluation Patient Details Name: [name] [name] MRN: [id number] DOB: [birth year] Today's Date: 03/13/2018    History of Present Illness Pt is a 57 y.o. M with significant PMH of CAD and previous cervical surgery who presents s/p anterior cervical decompression/discectomy fusion C4-6, exploration of fusion C6-7, and removal of hardware.   Clinical Impression   Patient evaluated by Occupational Therapy with no further acute OT needs identified. All education has been completed and the patient has no further questions. Pt and wife instructed in cervical precautions and safety with ADLs, as well as management of cervical collar.  They were able to verbalize/demonstrate understanding of all.   See below for any follow-up Occupational Therapy or equipment needs. OT is signing off. Thank you for this referral.      Follow Up Recommendations  No OT follow up;Supervision - Intermittent    Equipment Recommendations  None recommended by OT    Recommendations for Other Services       Precautions / Restrictions Precautions Precautions: Cervical Precaution Booklet Issued: Yes (comment) Precaution Comments: Pt able to verbalize understanding of cervical precautions.  Reviewed how to don/doff brace and change pads with pt and wife, who verbalized understanding  Required Braces or Orthoses: Cervical Brace Cervical Brace: Hard collar Restrictions Weight Bearing Restrictions: No      Mobility Bed Mobility Overal bed mobility: Modified Independent             General bed mobility comments: good log roll technique  Transfers Overall transfer level: Independent                    Balance Overall balance assessment: No apparent balance deficits (not formally assessed)                                         ADL either performed or assessed with clinical judgement   ADL Overall ADL's : Needs assistance/impaired Eating/Feeding: Independent    Grooming: Wash/dry hands;Wash/dry face;Oral care;Brushing hair;Supervision/safety;Standing Grooming Details (indicate cue type and reason): reviewed safe techniqe and to avoid bending when brushing teeth, shaving, etc. and no neck extension with shaving  Upper Body Bathing: Supervision/ safety;Sitting Upper Body Bathing Details (indicate cue type and reason): reviewed keeping neck straight when bathing/showering  Lower Body Bathing: Supervison/ safety;Sit to/from stand Lower Body Bathing Details (indicate cue type and reason): able to cross ankles over knees to access feet  Upper Body Dressing : Supervision/safety;Sitting Upper Body Dressing Details (indicate cue type and reason): reviewed best option for clothing is v-neck pullover or button front shirt  Lower Body Dressing: Supervision/safety;Sit to/from stand Lower Body Dressing Details (indicate cue type and reason): able to cross ankles over knees  Toilet Transfer: Supervision/safety;Ambulation;Regular Toilet   Toileting- Water quality scientist and Hygiene: Supervision/safety   Tub/ Shower Transfer: Tub transfer;Supervision/safety;Ambulation   Functional mobility during ADLs: Supervision/safety General ADL Comments: reviewed safety with IADLs.  Pt asking about fishing.  Cautioned him against this and advised him to talk with MD about when this might be an option      Vision         Perception     Praxis      Pertinent Vitals/Pain Pain Assessment: 0-10 Pain Score: 8  Pain Location: surgical site Pain Descriptors / Indicators: Guarding;Discomfort Pain Intervention(s): Monitored during session;Premedicated before session     Hand Dominance     Extremity/Trunk Assessment  Upper Extremity Assessment Upper Extremity Assessment: LUE deficits/detail RUE Deficits / Details: Reports left index finger numbness lateral aspct and down into thenar eminance.  Strength WFL, and [facility] WFl    Lower Extremity Assessment Lower Extremity  Assessment: Overall WFL for tasks assessed   Cervical / Trunk Assessment Cervical / Trunk Assessment: Normal   Communication Communication Communication: No difficulties   Cognition Arousal/Alertness: Awake/alert Behavior During Therapy: WFL for tasks assessed/performed Overall Cognitive Status: Within Functional Limits for tasks assessed                                     General Comments       Exercises     Shoulder Instructions      Home Living Family/patient expects to be discharged to:: Private residence Living Arrangements: Spouse/significant other Available Help at Discharge: Family;Available 24 hours/day Type of Home: House Home Access: Stairs to enter [employer] of Steps: 3 Entrance Stairs-Rails: Left Home Layout: One level     Bathroom Shower/Tub: Teacher, early years/pre: Standard     Home Equipment: None          Prior Functioning/Environment Level of Independence: Independent        Comments: Works in maintenance        OT Problem List: Pain      OT Treatment/Interventions:      OT Goals(Current goals can be found in the care plan section) Acute Rehab OT Goals Patient Stated Goal: go fishing  OT Goal Formulation: All assessment and education complete, DC therapy  OT Frequency:     Barriers to D/C:            Co-evaluation              AM-[name] PT "6 Clicks" Daily Activity     Outcome Measure Help from another person eating meals?: None Help from another person taking care of personal grooming?: None Help from another person toileting, which includes using toliet, bedpan, or urinal?: None Help from another person bathing (including washing, rinsing, drying)?: None Help from another person to put on and taking off regular upper body clothing?: None Help from another person to put on and taking off regular lower body clothing?: None 6 Click Score: 24   End of Session Equipment Utilized  During Treatment: Cervical collar Nurse Communication: Mobility status  Activity Tolerance: Patient tolerated treatment well Patient left: in bed;with call bell/phone within reach;with family/visitor present  OT Visit Diagnosis: Pain Pain - part of body: (neck )                Time: 4680-3212 OT Time Calculation (min): 12 min Charges:  OT General Charges $OT Visit: 1 Visit OT Evaluation $OT Eval Low Complexity: 1 Low G-Codes:     [facility], OTR/L [phone]   Ladonna Snide, Murle Hellstrom M 03/13/2018, 10:14 AM

## 2018-03-16 ENCOUNTER — Encounter (HOSPITAL_COMMUNITY): Payer: Self-pay | Admitting: Orthopedic Surgery

## 2018-03-16 NOTE — Discharge Summary (Signed)
Physician Discharge Summary  Patient ID: [name] [name] MRN: [id number] DOB/AGE: Apr 23, 1961 57 y.o.  Admit date: 03/12/2018 Discharge date: 03/13/18  Admission Diagnoses: Cervical Degenerative disc Disease  Discharge Diagnoses:  Active Problems:   S/P cervical spinal fusion   Past Medical History:  Diagnosis Date  . Alcohol use    regularly 3 beers/day  . CAD (coronary artery disease)    mild  . Dyslipidemia   . GERD (gastroesophageal reflux disease)    with esophagitis  . History of Helicobacter pylori infection   . Hypertension, essential   . Obesity, Class I, BMI 30-34.9 01/19/2016    Surgeries: Procedure(s): ANTERIOR CERVICAL DECOMPRESSION/DISCECTOMY FUSION C4-6, EXPLORATION OF FUSION C6-7, REMOVAL OF HARDWARE on 03/12/2018   Consultants (if any):   Discharged Condition: Improved  Hospital Course: FILEMON BRETON is an 57 y.o. male who was admitted 03/12/2018 with a diagnosis of Cervical degenerative disc disease and went to the operating room on 03/12/2018 and underwent the above named procedures.  Post op day one pt reports moderate level of pain.  Pt is voiding w/o difficulty.  Pt is ambulating in hallway.  Pt is cleared by PT/Ot for DC.  Social Work also signed off on this pt.    He was given perioperative antibiotics:  Anti-infectives (From admission, onward)   Start     Dose/Rate Route Frequency Ordered Stop   03/12/18 2200  ceFAZolin (ANCEF) IVPB 2g/100 mL premix     2 g 200 mL/hr over 30 Minutes Intravenous Every 8 hours 03/12/18 1750 03/13/18 0941   03/12/18 1118  ceFAZolin (ANCEF) IVPB 2g/100 mL premix     2 g 200 mL/hr over 30 Minutes Intravenous 30 min pre-op 03/12/18 1118 03/12/18 1340    .  He was given sequential compression devices, early ambulation, and TED for DVT prophylaxis.  He benefited maximally from the hospital stay and there were no complications.    Recent vital signs:  Vitals:   03/13/18 0316 03/13/18 0712  BP: (!) 146/88 127/84  Pulse:  88 81  Resp: 18 16  Temp: 97.8 F (36.6 C) 98.6 F (37 C)  SpO2: 93% 92%    Recent laboratory studies:  Lab Results  Component Value Date   HGB 17.0 03/06/2018   HGB 15.4 02/27/2018   HGB 15.1 11/30/2013   Lab Results  Component Value Date   WBC 7.0 03/06/2018   PLT 263 03/06/2018   Lab Results  Component Value Date   INR 1.0 02/27/2018   Lab Results  Component Value Date   NA 139 03/06/2018   K 3.8 03/06/2018   CL 100 03/06/2018   CO2 27 03/06/2018   BUN 12 03/06/2018   CREATININE 0.80 03/06/2018   GLUCOSE 115 (H) 03/06/2018    Discharge Medications:   Allergies as of 03/13/2018   No Known Allergies     Medication List    STOP taking these medications   aspirin EC 81 MG EC tablet Generic drug:  aspirin   HYDROcodone-acetaminophen 5-325 MG tablet Commonly known as:  NORCO/VICODIN     TAKE these medications   atorvastatin 40 MG tablet Commonly known as:  LIPITOR Take 1 tablet (40 mg total) by mouth daily.   chlorthalidone 25 MG tablet Commonly known as:  HYGROTON Take 1 tablet (25 mg total) by mouth daily.   methocarbamol 500 MG tablet Commonly known as:  ROBAXIN Take 1 tablet (500 mg total) by mouth 3 (three) times daily.   omeprazole 20 MG capsule  Commonly known as:  PRILOSEC Take 1 capsule (20 mg total) by mouth daily.   ondansetron 4 MG disintegrating tablet Commonly known as:  ZOFRAN ODT Take 1 tablet (4 mg total) by mouth every 8 (eight) hours as needed for nausea or vomiting.   oxyCODONE-acetaminophen 10-325 MG tablet Commonly known as:  PERCOCET Take 1 tablet by mouth every 4 (four) hours as needed for up to 5 days for pain.       Diagnostic Studies: Dg Chest 2 View  Result Date: 02/27/2018 CLINICAL DATA:  Preoperative evaluation.  Hypertension. EXAM: CHEST - 2 VIEW COMPARISON:  March 18, 2014 FINDINGS: There is no edema or consolidation. Heart size and pulmonary vascularity are normal. No adenopathy. There is degenerative change in  the thoracic spine. IMPRESSION: No edema or consolidation. Electronically Signed   By: Lowella Grip III M.D.   On: 02/27/2018 13:13   Dg Cervical Spine 2-3 Views  Result Date: 03/12/2018 CLINICAL DATA:  C4-5 and C5-6 ACDF. EXAM: CERVICAL SPINE - 2-3 VIEW; DG C-ARM 61-120 MIN FLUOROSCOPY TIME:  41 seconds. COMPARISON:  None. FINDINGS: Single lateral intraoperative fluoroscopic spot view of the cervical spine demonstrates C4-5 and C5-6 ACDF. Life support lines in place. IMPRESSION: Intraoperative imaging of C4-5 and C5-6 ACDF. Electronically Signed   By: Elon Alas M.D.   On: 03/12/2018 18:21   Dg C-arm 1-60 Min  Result Date: 03/12/2018 CLINICAL DATA:  C4-5 and C5-6 ACDF. EXAM: CERVICAL SPINE - 2-3 VIEW; DG C-ARM 61-120 MIN FLUOROSCOPY TIME:  41 seconds. COMPARISON:  None. FINDINGS: Single lateral intraoperative fluoroscopic spot view of the cervical spine demonstrates C4-5 and C5-6 ACDF. Life support lines in place. IMPRESSION: Intraoperative imaging of C4-5 and C5-6 ACDF. Electronically Signed   By: Elon Alas M.D.   On: 03/12/2018 18:21   Dg C-arm 1-60 Min  Result Date: 03/12/2018 CLINICAL DATA:  C4-5 and C5-6 ACDF. EXAM: CERVICAL SPINE - 2-3 VIEW; DG C-ARM 61-120 MIN FLUOROSCOPY TIME:  41 seconds. COMPARISON:  None. FINDINGS: Single lateral intraoperative fluoroscopic spot view of the cervical spine demonstrates C4-5 and C5-6 ACDF. Life support lines in place. IMPRESSION: Intraoperative imaging of C4-5 and C5-6 ACDF. Electronically Signed   By: Elon Alas M.D.   On: 03/12/2018 18:21    Disposition:  Post op meds provided Pt will present to clinic in 2 weeks  Discharge Instructions    Incentive spirometry RT   Complete by:  As directed       Follow-up Information    [name], MD Follow up in 2 week(s).   Specialty:  Orthopedic Surgery Contact information: [address] [place] [address] [phone]            Signed: Valinda Hoar 03/16/2018, 9:04 AM

## 2018-03-26 DIAGNOSIS — M25512 Pain in left shoulder: Secondary | ICD-10-CM | POA: Diagnosis not present

## 2018-04-04 ENCOUNTER — Encounter: Payer: Self-pay | Admitting: Family Medicine

## 2018-04-28 DIAGNOSIS — M542 Cervicalgia: Secondary | ICD-10-CM | POA: Diagnosis not present

## 2018-05-12 DIAGNOSIS — M7541 Impingement syndrome of right shoulder: Secondary | ICD-10-CM | POA: Diagnosis not present

## 2018-05-12 DIAGNOSIS — M7542 Impingement syndrome of left shoulder: Secondary | ICD-10-CM | POA: Diagnosis not present

## 2018-05-21 DIAGNOSIS — M25512 Pain in left shoulder: Secondary | ICD-10-CM | POA: Diagnosis not present

## 2018-05-21 DIAGNOSIS — M25511 Pain in right shoulder: Secondary | ICD-10-CM | POA: Diagnosis not present

## 2018-05-25 DIAGNOSIS — M25511 Pain in right shoulder: Secondary | ICD-10-CM | POA: Diagnosis not present

## 2018-05-25 DIAGNOSIS — M75121 Complete rotator cuff tear or rupture of right shoulder, not specified as traumatic: Secondary | ICD-10-CM | POA: Diagnosis not present

## 2018-05-25 DIAGNOSIS — M25512 Pain in left shoulder: Secondary | ICD-10-CM | POA: Diagnosis not present

## 2018-05-26 DIAGNOSIS — Z4789 Encounter for other orthopedic aftercare: Secondary | ICD-10-CM | POA: Diagnosis not present

## 2018-06-06 ENCOUNTER — Encounter: Payer: Self-pay | Admitting: Family Medicine

## 2018-06-06 DIAGNOSIS — M25511 Pain in right shoulder: Secondary | ICD-10-CM | POA: Insufficient documentation

## 2018-06-06 DIAGNOSIS — M25512 Pain in left shoulder: Secondary | ICD-10-CM

## 2018-06-16 DIAGNOSIS — S81852A Open bite, left lower leg, initial encounter: Secondary | ICD-10-CM | POA: Diagnosis not present

## 2018-06-16 DIAGNOSIS — S91051A Open bite, right ankle, initial encounter: Secondary | ICD-10-CM | POA: Diagnosis not present

## 2018-06-16 DIAGNOSIS — S91011A Laceration without foreign body, right ankle, initial encounter: Secondary | ICD-10-CM | POA: Diagnosis not present

## 2018-07-03 DIAGNOSIS — M7521 Bicipital tendinitis, right shoulder: Secondary | ICD-10-CM | POA: Insufficient documentation

## 2018-07-03 DIAGNOSIS — M75121 Complete rotator cuff tear or rupture of right shoulder, not specified as traumatic: Secondary | ICD-10-CM | POA: Diagnosis not present

## 2018-07-03 DIAGNOSIS — M7541 Impingement syndrome of right shoulder: Secondary | ICD-10-CM | POA: Diagnosis not present

## 2018-07-03 DIAGNOSIS — M75120 Complete rotator cuff tear or rupture of unspecified shoulder, not specified as traumatic: Secondary | ICD-10-CM | POA: Insufficient documentation

## 2018-07-17 DIAGNOSIS — M7521 Bicipital tendinitis, right shoulder: Secondary | ICD-10-CM | POA: Diagnosis not present

## 2018-07-17 DIAGNOSIS — G8918 Other acute postprocedural pain: Secondary | ICD-10-CM | POA: Diagnosis not present

## 2018-07-17 DIAGNOSIS — M7541 Impingement syndrome of right shoulder: Secondary | ICD-10-CM | POA: Diagnosis not present

## 2018-07-17 DIAGNOSIS — M24111 Other articular cartilage disorders, right shoulder: Secondary | ICD-10-CM | POA: Diagnosis not present

## 2018-07-17 DIAGNOSIS — S46011A Strain of muscle(s) and tendon(s) of the rotator cuff of right shoulder, initial encounter: Secondary | ICD-10-CM | POA: Diagnosis not present

## 2018-08-07 DIAGNOSIS — H6692 Otitis media, unspecified, left ear: Secondary | ICD-10-CM | POA: Diagnosis not present

## 2018-08-07 DIAGNOSIS — H6122 Impacted cerumen, left ear: Secondary | ICD-10-CM | POA: Diagnosis not present

## 2018-08-07 DIAGNOSIS — H9202 Otalgia, left ear: Secondary | ICD-10-CM | POA: Diagnosis not present

## 2018-08-28 DIAGNOSIS — M25511 Pain in right shoulder: Secondary | ICD-10-CM | POA: Insufficient documentation

## 2018-09-18 DIAGNOSIS — M25511 Pain in right shoulder: Secondary | ICD-10-CM | POA: Diagnosis not present

## 2018-10-28 DIAGNOSIS — M25511 Pain in right shoulder: Secondary | ICD-10-CM | POA: Diagnosis not present

## 2018-11-23 DIAGNOSIS — M5412 Radiculopathy, cervical region: Secondary | ICD-10-CM | POA: Diagnosis not present

## 2018-12-01 DIAGNOSIS — E7800 Pure hypercholesterolemia, unspecified: Secondary | ICD-10-CM | POA: Insufficient documentation

## 2018-12-01 DIAGNOSIS — M5412 Radiculopathy, cervical region: Secondary | ICD-10-CM | POA: Diagnosis not present

## 2018-12-01 DIAGNOSIS — Z981 Arthrodesis status: Secondary | ICD-10-CM | POA: Diagnosis not present

## 2018-12-02 ENCOUNTER — Encounter: Payer: Self-pay | Admitting: Family Medicine

## 2018-12-02 DIAGNOSIS — M5412 Radiculopathy, cervical region: Secondary | ICD-10-CM | POA: Insufficient documentation

## 2019-01-01 DIAGNOSIS — Z981 Arthrodesis status: Secondary | ICD-10-CM | POA: Diagnosis not present

## 2019-01-01 DIAGNOSIS — M542 Cervicalgia: Secondary | ICD-10-CM | POA: Diagnosis not present

## 2019-05-04 ENCOUNTER — Other Ambulatory Visit: Payer: Self-pay | Admitting: Family Medicine

## 2019-05-04 NOTE — Telephone Encounter (Signed)
LOV 02/27/2018. No future appointments on file. Left message for patient to call back and schedule CPE>

## 2019-08-30 ENCOUNTER — Ambulatory Visit: Payer: 59 | Attending: Internal Medicine

## 2019-08-30 DIAGNOSIS — Z20822 Contact with and (suspected) exposure to covid-19: Secondary | ICD-10-CM

## 2019-09-01 LAB — NOVEL CORONAVIRUS, NAA: SARS-CoV-2, NAA: NOT DETECTED

## 2019-09-02 ENCOUNTER — Telehealth: Payer: Self-pay | Admitting: *Deleted

## 2019-09-02 NOTE — Telephone Encounter (Signed)
Patient called given negative covid results . 

## 2019-10-08 ENCOUNTER — Ambulatory Visit (INDEPENDENT_AMBULATORY_CARE_PROVIDER_SITE_OTHER): Payer: 59 | Admitting: Family Medicine

## 2019-10-08 ENCOUNTER — Other Ambulatory Visit: Payer: Self-pay

## 2019-10-08 ENCOUNTER — Encounter: Payer: Self-pay | Admitting: Family Medicine

## 2019-10-08 VITALS — BP 132/78 | HR 94 | Temp 97.8°F | Ht 68.0 in | Wt 251.2 lb

## 2019-10-08 DIAGNOSIS — E785 Hyperlipidemia, unspecified: Secondary | ICD-10-CM

## 2019-10-08 DIAGNOSIS — Z0001 Encounter for general adult medical examination with abnormal findings: Secondary | ICD-10-CM

## 2019-10-08 DIAGNOSIS — R202 Paresthesia of skin: Secondary | ICD-10-CM

## 2019-10-08 DIAGNOSIS — Z1159 Encounter for screening for other viral diseases: Secondary | ICD-10-CM | POA: Diagnosis not present

## 2019-10-08 DIAGNOSIS — Z125 Encounter for screening for malignant neoplasm of prostate: Secondary | ICD-10-CM | POA: Diagnosis not present

## 2019-10-08 DIAGNOSIS — R519 Headache, unspecified: Secondary | ICD-10-CM

## 2019-10-08 DIAGNOSIS — M5030 Other cervical disc degeneration, unspecified cervical region: Secondary | ICD-10-CM

## 2019-10-08 DIAGNOSIS — Z981 Arthrodesis status: Secondary | ICD-10-CM

## 2019-10-08 DIAGNOSIS — M5412 Radiculopathy, cervical region: Secondary | ICD-10-CM

## 2019-10-08 DIAGNOSIS — F1090 Alcohol use, unspecified, uncomplicated: Secondary | ICD-10-CM

## 2019-10-08 DIAGNOSIS — Z7289 Other problems related to lifestyle: Secondary | ICD-10-CM

## 2019-10-08 DIAGNOSIS — Z789 Other specified health status: Secondary | ICD-10-CM

## 2019-10-08 DIAGNOSIS — K219 Gastro-esophageal reflux disease without esophagitis: Secondary | ICD-10-CM

## 2019-10-08 DIAGNOSIS — I10 Essential (primary) hypertension: Secondary | ICD-10-CM

## 2019-10-08 MED ORDER — OMEPRAZOLE 20 MG PO CPDR: 20.0000 mg | DELAYED_RELEASE_CAPSULE | Freq: Every day | ORAL | 3 refills | Status: DC

## 2019-10-08 MED ORDER — CHLORTHALIDONE 25 MG PO TABS: ORAL_TABLET | ORAL | 3 refills | Status: DC

## 2019-10-08 MED ORDER — ATORVASTATIN CALCIUM 40 MG PO TABS: 40.0000 mg | ORAL_TABLET | Freq: Every day | ORAL | 3 refills | Status: DC

## 2019-10-08 NOTE — Assessment & Plan Note (Signed)
Chronic, stable on daily low dose PPI - breakthrough symptoms if he misses a dose.

## 2019-10-08 NOTE — Progress Notes (Signed)
This visit was conducted in person.  BP 132/78 (BP Location: Left Arm, Patient Position: Sitting, Cuff Size: Large)   Pulse 94   Temp 97.8 F (36.6 C) (Temporal)   Ht _0  (1.727 m)   Wt 251 lb 3 oz (113.9 kg)   SpO2 98%   BMI 38.19 kg/m    CC: CPE Subjective:    Patient ID: [name] [name], male    DOB: May 24, 1961, 59 y.o.   MRN: [id number]  HPI: [name] [name] is a 59 y.o. male presenting on 10/08/2019 for Annual Exam   Itchy rash on arm. Present for last few months.   Ongoing neck pain, R arm stays numb. S/p neck surgery. S/p shoulder surgery tendon repair.   1.5 mo h/o L temporal pain with radiation to L jaw. Describes sharp knife pain. Sensitive to bright lights. No phonophobia, nausea or vomiting. No benefit with aleve, aspirin, tylenol. No vision changes, fevers/chills, sinus pressure pain/congestion. Dull numbness alternating with sharp stabbing.   Preventative: COLONOSCOPY Date: 2012 WNL with benign polyps per PCP report (from [place]) Prostate screening - discussed, declines screening. Nocturia x1.  Flu shot - declines Td 01/2018 shingrix - declines at this time Covid vaccine - considering Seat belt use discussed Sunscreen use discussed. No changing moles on skin. Non smoker Alcohol - fifth of whiskey every 2 wks - 6oz (4 shots) every 3 days. Drinks to help control neck pain.  Dentist - not recently Eye exam - 2019 - new bifocals  Lives with wife, 1 dog Edu: HS Occ: maintenance Activity: active at work (walking) - limited by neck pain Diet: some water, some fruits/vegetables      Relevant past medical, surgical, family and social history reviewed and updated as indicated. Interim medical history since our last visit reviewed. Allergies and medications reviewed and updated. Outpatient Medications Prior to Visit  Medication Sig Dispense Refill  . atorvastatin (LIPITOR) 40 MG tablet TAKE 1 TABLET(40 MG) BY MOUTH DAILY 90 tablet 0  . chlorthalidone (HYGROTON) 25  MG tablet TAKE 1 TABLET(25 MG) BY MOUTH DAILY 90 tablet 0  . omeprazole (PRILOSEC) 20 MG capsule Take 1 capsule (20 mg total) by mouth daily. 90 capsule 3  . methocarbamol (ROBAXIN) 500 MG tablet Take 1 tablet (500 mg total) by mouth 3 (three) times daily. 30 tablet 0  . ondansetron (ZOFRAN ODT) 4 MG disintegrating tablet Take 1 tablet (4 mg total) by mouth every 8 (eight) hours as needed for nausea or vomiting. 20 tablet 0   No facility-administered medications prior to visit.     Per HPI unless specifically indicated in ROS section below Review of Systems  Constitutional: Negative for activity change, appetite change, chills, fatigue, fever and unexpected weight change.  HENT: Negative for hearing loss.   Eyes: Negative for visual disturbance.  Respiratory: Negative for cough, chest tightness, shortness of breath and wheezing.   Cardiovascular: Negative for chest pain, palpitations and leg swelling.  Gastrointestinal: Negative for abdominal distention, abdominal pain, blood in stool, constipation, diarrhea, nausea and vomiting.  Genitourinary: Negative for difficulty urinating and hematuria.  Musculoskeletal: Negative for arthralgias, myalgias and neck pain.  Skin: Negative for rash.  Neurological: Positive for headaches. Negative for dizziness, seizures and syncope.  Hematological: Negative for adenopathy. Does not bruise/bleed easily.  Psychiatric/Behavioral: Negative for dysphoric mood. The patient is not nervous/anxious.    Objective:    BP 132/78 (BP Location: Left Arm, Patient Position: Sitting, Cuff Size: Large)   Pulse 94  Temp 97.8 F (36.6 C) (Temporal)   Ht _0  (1.727 m)   Wt 251 lb 3 oz (113.9 kg)   SpO2 98%   BMI 38.19 kg/m   Wt Readings from Last 3 Encounters:  10/08/19 251 lb 3 oz (113.9 kg)  03/12/18 247 lb (112 kg)  03/06/18 247 lb 11.2 oz (112.4 kg)    Physical Exam Vitals and nursing note reviewed.  Constitutional:      General: He is not in acute  distress.    Appearance: Normal appearance. He is well-developed. He is obese. He is not ill-appearing.  HENT:     Head: Normocephalic and atraumatic.     Comments: Reproducible L temporal pain to palpation    Right Ear: Hearing, tympanic membrane, ear canal and external ear normal.     Left Ear: Hearing, tympanic membrane, ear canal and external ear normal.     Mouth/Throat:     Pharynx: Uvula midline.  Eyes:     General: No scleral icterus.    Extraocular Movements: Extraocular movements intact.     Conjunctiva/sclera: Conjunctivae normal.     Pupils: Pupils are equal, round, and reactive to light.  Neck:     Thyroid: No thyromegaly or thyroid tenderness.     Vascular: No carotid bruit.  Cardiovascular:     Rate and Rhythm: Normal rate and regular rhythm.     Pulses: Normal pulses.          Radial pulses are 2+ on the right side and 2+ on the left side.     Heart sounds: Normal heart sounds. No murmur.  Pulmonary:     Effort: Pulmonary effort is normal. No respiratory distress.     Breath sounds: Normal breath sounds. No wheezing, rhonchi or rales.  Abdominal:     General: Abdomen is flat. Bowel sounds are normal. There is no distension.     Palpations: Abdomen is soft. There is no mass.     Tenderness: There is no abdominal tenderness. There is no guarding or rebound.     Hernia: No hernia is present.  Musculoskeletal:        General: Normal range of motion.     Cervical back: Normal range of motion and neck supple.     Right lower leg: No edema.     Left lower leg: No edema.  Lymphadenopathy:     Cervical: No cervical adenopathy.  Skin:    General: Skin is warm and dry.     Findings: Rash present.     Comments: Itchy scaly papules to R lateral and posterior forearm into upper arm - no other rash present  Neurological:     General: No focal deficit present.     Mental Status: He is alert and oriented to person, place, and time.     Comments: CN grossly intact, station  and gait intact  Psychiatric:        Mood and Affect: Mood normal.        Behavior: Behavior normal.        Thought Content: Thought content normal.        Judgment: Judgment normal.       Results for orders placed or performed in visit on 08/30/19  Novel Coronavirus, NAA ([facility])   Specimen: Nasopharyngeal(NP) swabs in vial transport medium   NASOPHARYNGE  TESTING  Result Value Ref Range   SARS-CoV-2, NAA Not Detected Not Detected   Assessment & Plan:  This visit occurred during the  SARS-CoV-2 public health emergency.  Safety protocols were in place, including screening questions prior to the visit, additional usage of staff PPE, and extensive cleaning of exam room while observing appropriate contact time as indicated for disinfecting solutions.   Problem List Items Addressed This Visit    Severe obesity (BMI 35.0-39.9) with comorbidity ([place])    Encouraged healthy diet and increased activity to affect sustainable weight loss.       S/P cervical spinal fusion   Left temporal headache    New over the last 1.5 months. Given location and reproducibility of pain, concern for temporal arteritis - check ESR today. Discussed possible steroid + rheum eval.       Relevant Orders   CBC with Differential/Platelet   Sedimentation rate   Hypertension, essential    Chronic, stable on chlorthalidone 78m daily - continue.       Relevant Medications   atorvastatin (LIPITOR) 40 MG tablet   chlorthalidone (HYGROTON) 25 MG tablet   GERD (gastroesophageal reflux disease)    Chronic, stable on daily low dose PPI - breakthrough symptoms if he misses a dose.       Relevant Medications   omeprazole (PRILOSEC) 20 MG capsule   Encounter for general adult medical examination with abnormal findings - Primary    Preventative protocols reviewed and updated unless pt declined. Discussed healthy diet and lifestyle.       Dyslipidemia    Chronic, continues statin. Will need FLP when he returns  fasting. The 10-year ASCVD risk score (Mikey BussingDC JBrooke Bonito, et al., 2013) is: 8.6%   Values used to calculate the score:     Age: [age]years     Sex: Male     Is Non-Hispanic African American: No     Diabetic: No     Tobacco smoker: No     Systolic Blood Pressure: 1275mmHg     Is BP treated: Yes     HDL Cholesterol: 37 mg/dL     Total Cholesterol: 166 mg/dL       Relevant Medications   atorvastatin (LIPITOR) 40 MG tablet   Other Relevant Orders   Comprehensive metabolic panel   TSH   Lipid panel   DDD (degenerative disc disease), cervical    Ongoing neck pain and R arm paresthesias despite ACDF surgery.       Cervical radiculopathy   Alcohol use    AUDIT-C = 4 Alcohol use reviewed. Encouraged limiting to 3 drinks/sitting.        Other Visit Diagnoses    Need for hepatitis C screening test       Relevant Orders   Hepatitis C antibody   Special screening for malignant neoplasm of prostate       Relevant Orders   PSA   Paresthesia       Relevant Orders   Vitamin B12       Meds ordered this encounter  Medications  . atorvastatin (LIPITOR) 40 MG tablet    Sig: Take 1 tablet (40 mg total) by mouth daily at 6 PM.    Dispense:  90 tablet    Refill:  3  . chlorthalidone (HYGROTON) 25 MG tablet    Sig: TAKE 1 TABLET(25 MG) BY MOUTH DAILY    Dispense:  90 tablet    Refill:  3  . omeprazole (PRILOSEC) 20 MG capsule    Sig: Take 1 capsule (20 mg total) by mouth daily.    Dispense:  90 capsule  Refill:  3   Orders Placed This Encounter  Procedures  . Comprehensive metabolic panel  . TSH  . CBC with Differential/Platelet  . Sedimentation rate  . PSA  . Hepatitis C antibody  . Vitamin B12  . Lipid panel    Standing Status:   Future    Standing Expiration Date:   10/07/2020    Patient instructions: Labs today for headache.  Return at your convenience for fasting labs (cholesterol and possibly sugar).  For itchy skin rash - possible dry skin. Moisturize regularly,  avoid too hot showers.   Follow up plan: Return in about 1 year (around 10/07/2020) for annual exam, prior fasting for blood work.  Ria Bush, MD

## 2019-10-08 NOTE — Assessment & Plan Note (Signed)
Chronic, stable on chlorthalidone 25mg  daily - continue.

## 2019-10-08 NOTE — Assessment & Plan Note (Signed)
Preventative protocols reviewed and updated unless pt declined. Discussed healthy diet and lifestyle.  

## 2019-10-08 NOTE — Assessment & Plan Note (Signed)
Chronic, continues statin. Will need FLP when he returns fasting. The 10-year ASCVD risk score [name] [name]., et al., 2013) is: 8.6%   Values used to calculate the score:     Age: 59 years     Sex: Male     Is Non-Hispanic African American: No     Diabetic: No     Tobacco smoker: No     Systolic Blood Pressure: [id number] mmHg     Is BP treated: Yes     HDL Cholesterol: 37 mg/dL     Total Cholesterol: 166 mg/dL

## 2019-10-08 NOTE — Assessment & Plan Note (Signed)
Encouraged healthy diet and increased activity to affect sustainable weight loss.

## 2019-10-08 NOTE — Patient Instructions (Addendum)
Labs today for headache.  Return at your convenience for fasting labs (cholesterol and possibly sugar).  For itchy skin rash - possible dry skin. Moisturize regularly, avoid too hot showers.   Health Maintenance, Male Adopting a healthy lifestyle and getting preventive care are important in promoting health and wellness. Ask your health care provider about:  The right schedule for you to have regular tests and exams.  Things you can do on your own to prevent diseases and keep yourself healthy. What should I know about diet, weight, and exercise? Eat a healthy diet   Eat a diet that includes plenty of vegetables, fruits, low-fat dairy products, and lean protein.  Do not eat a lot of foods that are high in solid fats, added sugars, or sodium. Maintain a healthy weight Body mass index (BMI) is a measurement that can be used to identify possible weight problems. It estimates body fat based on height and weight. Your health care provider can help determine your BMI and help you achieve or maintain a healthy weight. Get regular exercise Get regular exercise. This is one of the most important things you can do for your health. Most adults should:  Exercise for at least 150 minutes each week. The exercise should increase your heart rate and make you sweat (moderate-intensity exercise).  Do strengthening exercises at least twice a week. This is in addition to the moderate-intensity exercise.  Spend less time sitting. Even light physical activity can be beneficial. Watch cholesterol and blood lipids Have your blood tested for lipids and cholesterol at 58 years of age, then have this test every 5 years. You may need to have your cholesterol levels checked more often if:  Your lipid or cholesterol levels are high.  You are older than 58 years of age.  You are at high risk for heart disease. What should I know about cancer screening? Many types of cancers can be detected early and may often  be prevented. Depending on your health history and family history, you may need to have cancer screening at various ages. This may include screening for:  Colorectal cancer.  Prostate cancer.  Skin cancer.  Lung cancer. What should I know about heart disease, diabetes, and high blood pressure? Blood pressure and heart disease  High blood pressure causes heart disease and increases the risk of stroke. This is more likely to develop in people who have high blood pressure readings, are of African descent, or are overweight.  Talk with your health care provider about your target blood pressure readings.  Have your blood pressure checked: ? Every 3-5 years if you are 73-59 years of age. ? Every year if you are 69 years old or older.  If you are between the ages of 7 and 25 and are a current or former smoker, ask your health care provider if you should have a one-time screening for abdominal aortic aneurysm (AAA). Diabetes Have regular diabetes screenings. This checks your fasting blood sugar level. Have the screening done:  Once every three years after age 68 if you are at a normal weight and have a low risk for diabetes.  More often and at a younger age if you are overweight or have a high risk for diabetes. What should I know about preventing infection? Hepatitis B If you have a higher risk for hepatitis B, you should be screened for this virus. Talk with your health care provider to find out if you are at risk for hepatitis B infection. Hepatitis  C Blood testing is recommended for:  Everyone born from [birth year] through 1964.  Anyone with known risk factors for hepatitis C. Sexually transmitted infections (STIs)  You should be screened each year for STIs, including gonorrhea and chlamydia, if: ? You are sexually active and are younger than 58 years of age. ? You are older than 58 years of age and your health care provider tells you that you are at risk for this type of  infection. ? Your sexual activity has changed since you were last screened, and you are at increased risk for chlamydia or gonorrhea. Ask your health care provider if you are at risk.  Ask your health care provider about whether you are at high risk for HIV. Your health care provider may recommend a prescription medicine to help prevent HIV infection. If you choose to take medicine to prevent HIV, you should first get tested for HIV. You should then be tested every 3 months for as long as you are taking the medicine. Follow these instructions at home: Lifestyle  Do not use any products that contain nicotine or tobacco, such as cigarettes, e-cigarettes, and chewing tobacco. If you need help quitting, ask your health care provider.  Do not use street drugs.  Do not share needles.  Ask your health care provider for help if you need support or information about quitting drugs. Alcohol use  Do not drink alcohol if your health care provider tells you not to drink.  If you drink alcohol: ? Limit how much you have to 0-2 drinks a day. ? Be aware of how much alcohol is in your drink. In the [place] one drink equals one 12 oz bottle of beer (355 mL), one 5 oz glass of wine (148 mL), or one 1 oz glass of hard liquor (44 mL). General instructions  Schedule regular health, dental, and eye exams.  Stay current with your vaccines.  Tell your health care provider if: ? You often feel depressed. ? You have ever been abused or do not feel safe at home. Summary  Adopting a healthy lifestyle and getting preventive care are important in promoting health and wellness.  Follow your health care provider's instructions about healthy diet, exercising, and getting tested or screened for diseases.  Follow your health care provider's instructions on monitoring your cholesterol and blood pressure. This information is not intended to replace advice given to you by your health care provider. Make sure you  discuss any questions you have with your health care provider. Document Revised: 08/05/2018 Document Reviewed: 08/05/2018 Elsevier Patient Education  2020 [employer].

## 2019-10-08 NOTE — Assessment & Plan Note (Signed)
New over the last 1.5 months. Given location and reproducibility of pain, concern for temporal arteritis - check ESR today. Discussed possible steroid + rheum eval.

## 2019-10-08 NOTE — Assessment & Plan Note (Signed)
AUDIT-C = 4 Alcohol use reviewed. Encouraged limiting to 3 drinks/sitting.

## 2019-10-08 NOTE — Assessment & Plan Note (Addendum)
Ongoing neck pain and R arm paresthesias despite ACDF surgery.

## 2019-10-11 LAB — CBC WITH DIFFERENTIAL/PLATELET
Absolute Monocytes: 459 cells/uL (ref 200–950)
Basophils Absolute: 31 cells/uL (ref 0–200)
Basophils Relative: 0.5 %
Eosinophils Absolute: 87 cells/uL (ref 15–500)
Eosinophils Relative: 1.4 %
HCT: 43.1 % (ref 38.5–50.0)
Hemoglobin: 15 g/dL (ref 13.2–17.1)
Lymphs Abs: 2139 cells/uL (ref 850–3900)
MCH: 31.6 pg (ref 27.0–33.0)
MCHC: 34.8 g/dL (ref 32.0–36.0)
MCV: 90.7 fL (ref 80.0–100.0)
MPV: 9.5 fL (ref 7.5–12.5)
Monocytes Relative: 7.4 %
Neutro Abs: 3484 cells/uL (ref 1500–7800)
Neutrophils Relative %: 56.2 %
Platelets: 284 10*3/uL (ref 140–400)
RBC: 4.75 10*6/uL (ref 4.20–5.80)
RDW: 13 % (ref 11.0–15.0)
Total Lymphocyte: 34.5 %
WBC: 6.2 10*3/uL (ref 3.8–10.8)

## 2019-10-11 LAB — COMPREHENSIVE METABOLIC PANEL
AG Ratio: 1.9 (calc) (ref 1.0–2.5)
ALT: 61 U/L — ABNORMAL HIGH (ref 9–46)
AST: 30 U/L (ref 10–35)
Albumin: 4.4 g/dL (ref 3.6–5.1)
Alkaline phosphatase (APISO): 92 U/L (ref 35–144)
BUN: 19 mg/dL (ref 7–25)
CO2: 28 mmol/L (ref 20–32)
Calcium: 9.7 mg/dL (ref 8.6–10.3)
Chloride: 102 mmol/L (ref 98–110)
Creat: 0.95 mg/dL (ref 0.70–1.33)
Globulin: 2.3 g/dL (calc) (ref 1.9–3.7)
Glucose, Bld: 98 mg/dL (ref 65–99)
Potassium: 3.9 mmol/L (ref 3.5–5.3)
Sodium: 143 mmol/L (ref 135–146)
Total Bilirubin: 0.5 mg/dL (ref 0.2–1.2)
Total Protein: 6.7 g/dL (ref 6.1–8.1)

## 2019-10-11 LAB — PSA: PSA: 0.2 ng/mL (ref <=4.0)

## 2019-10-11 LAB — VITAMIN B12: Vitamin B-12: 473 pg/mL (ref 200–1100)

## 2019-10-11 LAB — HEPATITIS C ANTIBODY
Hepatitis C Ab: NONREACTIVE
SIGNAL TO CUT-OFF: 0.01 (ref <1.00)

## 2019-10-11 LAB — SEDIMENTATION RATE: Sed Rate: 19 mm/h (ref 0–20)

## 2019-10-11 LAB — TSH: TSH: 1.34 mIU/L (ref 0.40–4.50)

## 2019-10-13 ENCOUNTER — Telehealth: Payer: 59

## 2019-10-13 ENCOUNTER — Other Ambulatory Visit: Payer: Self-pay | Admitting: Family Medicine

## 2019-10-13 DIAGNOSIS — R7401 Elevation of levels of liver transaminase levels: Secondary | ICD-10-CM

## 2019-10-13 DIAGNOSIS — R519 Headache, unspecified: Secondary | ICD-10-CM

## 2019-10-15 ENCOUNTER — Emergency Department: Payer: 59

## 2019-10-15 ENCOUNTER — Encounter: Payer: Self-pay | Admitting: Emergency Medicine

## 2019-10-15 ENCOUNTER — Emergency Department
Admission: EM | Admit: 2019-10-15 | Discharge: 2019-10-15 | Disposition: A | Discharge status: Home / Self Care | Payer: 59 | Attending: Emergency Medicine | Admitting: Emergency Medicine

## 2019-10-15 ENCOUNTER — Other Ambulatory Visit: Payer: Self-pay

## 2019-10-15 DIAGNOSIS — I10 Essential (primary) hypertension: Secondary | ICD-10-CM | POA: Insufficient documentation

## 2019-10-15 DIAGNOSIS — I2510 Atherosclerotic heart disease of native coronary artery without angina pectoris: Secondary | ICD-10-CM | POA: Diagnosis not present

## 2019-10-15 DIAGNOSIS — M316 Other giant cell arteritis: Secondary | ICD-10-CM | POA: Insufficient documentation

## 2019-10-15 DIAGNOSIS — R519 Headache, unspecified: Secondary | ICD-10-CM | POA: Diagnosis present

## 2019-10-15 LAB — BASIC METABOLIC PANEL
Anion gap: 9 (ref 5–15)
BUN: 19 mg/dL (ref 6–20)
CO2: 28 mmol/L (ref 22–32)
Calcium: 9.4 mg/dL (ref 8.9–10.3)
Chloride: 105 mmol/L (ref 98–111)
Creatinine, Ser: 0.87 mg/dL (ref 0.61–1.24)
GFR calc Af Amer: >60 mL/min (ref >60)
GFR calc non Af Amer: >60 mL/min (ref >60)
Glucose, Bld: 114 mg/dL — ABNORMAL HIGH (ref 70–99)
Potassium: 3.6 mmol/L (ref 3.5–5.1)
Sodium: 142 mmol/L (ref 135–145)

## 2019-10-15 LAB — CBC
HCT: 42.8 % (ref 39.0–52.0)
Hemoglobin: 14.6 g/dL (ref 13.0–17.0)
MCH: 31.7 pg (ref 26.0–34.0)
MCHC: 34.1 g/dL (ref 30.0–36.0)
MCV: 93 fL (ref 80.0–100.0)
Platelets: 273 10*3/uL (ref 150–400)
RBC: 4.6 MIL/uL (ref 4.22–5.81)
RDW: 12.5 % (ref 11.5–15.5)
WBC: 7.8 10*3/uL (ref 4.0–10.5)
nRBC: 0 % (ref 0.0–0.2)

## 2019-10-15 LAB — SEDIMENTATION RATE: Sed Rate: 15 mm/hr (ref 0–20)

## 2019-10-15 MED ORDER — SODIUM CHLORIDE 0.9 % IV SOLN
250.0000 mg | Freq: Once | INTRAVENOUS | Status: AC
  Administered 2019-10-15: 250 mg via INTRAVENOUS
  Filled 2019-10-15: qty 250

## 2019-10-15 MED ORDER — IOHEXOL 350 MG/ML SOLN
75.0000 mL | Freq: Once | INTRAVENOUS | Status: AC | PRN
  Administered 2019-10-15: 16:00:00 75 mL via INTRAVENOUS
  Filled 2019-10-15: qty 75

## 2019-10-15 MED ORDER — PREDNISONE 20 MG PO TABS: 60.0000 mg | ORAL_TABLET | Freq: Every day | ORAL | 0 refills | Status: AC

## 2019-10-15 NOTE — ED Triage Notes (Signed)
Pt presents to ED c/o L temporal headache x1.67mo, gradually worsening. Pt had labs done at PCP office checking for temporal arteritis that came back normal. Pt was referred to neurology for sustained headache but earliest appt is 1 month away. Has been using bayer aspirin with no relief. Reports sensitivity to light and sound and blurriness to L eye.

## 2019-10-15 NOTE — ED Provider Notes (Signed)
[facility] Emergency Department Provider Note  ____________________________________________  Time seen: Approximately 3:44 PM  I have reviewed the triage vital signs and the nursing notes.   HISTORY  Chief Complaint Headache    HPI [name] [name] is a 59 y.o. male who presents the emergency department complaining of left temporal headache x1-1/2 months.  Patient states that he began to have pain in his left temporal region.  He describes it as a headache but states that it feels "outside of my brain."  Patient states that the pain is localized to the left temporal region.  He states that sometimes it will radiate into his cheek and down along his jaw as well as to behind his ear but the pain in his left temporal region is constant.  This has been slowly worsening.  Patient states that he is now also having some blurred vision to the left eye.  He states that it is "almost like something like a film is across my eye".  Patient states that he saw his primary care, primary care was referring him to neurology for further evaluation of new onset headache.  Patient has a history of coronary artery disease, GERD, hypertension, degenerative disc disease.  No complaints of chronic medical issues.  Patient has been taking aspirin for this complaint but no other medications.  Patient denies any difficulty formulating thoughts or words.  No weakness on one side of the body versus the other.  Patient denies any neck pain or stiffness, chest pain, shortness of breath, abdominal pain, nausea or vomiting.         Past Medical History:  Diagnosis Date  . Alcohol use    regularly 3 beers/day  . CAD (coronary artery disease)    mild  . Dyslipidemia   . GERD (gastroesophageal reflux disease)    with esophagitis  . History of Helicobacter pylori infection   . Hypertension, essential   . Obesity, Class I, BMI 30-34.9 01/19/2016    Patient Active Problem List   Diagnosis Date Noted   . Left temporal headache 10/08/2019  . Cervical radiculopathy 12/02/2018  . Bilateral shoulder pain 06/06/2018  . S/P cervical spinal fusion 03/12/2018  . DDD (degenerative disc disease), cervical 01/20/2018  . Encounter for general adult medical examination with abnormal findings 02/29/2016  . Alcohol use 02/29/2016  . Asymptomatic varicose veins of bilateral lower extremities 01/19/2016  . Severe obesity (BMI 35.0-39.9) with comorbidity ([place]) 01/19/2016  . CAD (coronary artery disease)   . Dyslipidemia   . Hypertension, essential   . GERD (gastroesophageal reflux disease)   . Impingement syndrome of right shoulder 01/13/2016    Past Surgical History:  Procedure Laterality Date  . ANTERIOR CERVICAL DECOMP/DISCECTOMY FUSION  2012   C4-5 [facility])  . ANTERIOR CERVICAL DECOMP/DISCECTOMY FUSION N/A 03/12/2018   ACDF C4-6, EXPLORATION OF FUSION C6-7, REMOVAL OF HARDWARE;  [name], [name], MD)  . CARDIAC CATHETERIZATION  2013   no significant CAD, nl LVEF 60% [name])  . COLONOSCOPY  2012   WNL per PCP report  . CT CTA CORONARY W/CA SCORE W/CM &/OR WO/CM  2015   minor luminal irregularities, Ca score = 90 [name])  . KNEE ARTHROSCOPY Bilateral     Prior to Admission medications   Medication Sig Start Date End Date Taking? Authorizing Provider  atorvastatin (LIPITOR) 40 MG tablet Take 1 tablet (40 mg total) by mouth daily at 6 PM. 10/08/19   Ria Bush, MD  chlorthalidone (HYGROTON) 25 MG tablet TAKE  1 TABLET(25 MG) BY MOUTH DAILY 10/08/19   Ria Bush, MD  omeprazole (PRILOSEC) 20 MG capsule Take 1 capsule (20 mg total) by mouth daily. 10/08/19   Ria Bush, MD  predniSONE (DELTASONE) 20 MG tablet Take 3 tablets (60 mg total) by mouth daily with breakfast for 21 days. 10/15/19 11/05/19  , Charline Bills, PA-C    Allergies Patient has no known allergies.  Family History  Problem Relation Age of Onset  . CAD Father 52       MI  . Hyperlipidemia Father   .  Diabetes Paternal Grandfather   . Cancer Mother 62       deceased from bone cancer  . Stroke Neg Hx     Social History Social History   Tobacco Use  . Smoking status: Never Smoker  . Smokeless tobacco: Never Used  Substance Use Topics  . Alcohol use: Yes    Alcohol/week: 0.0 standard drinks    Comment: 8 oz whiskey a night  . Drug use: No     Review of Systems  Constitutional: No fever/chills Eyes: No visual changes. No discharge ENT: No upper respiratory complaints. Cardiovascular: no chest pain. Respiratory: no cough. No SOB. Gastrointestinal: No abdominal pain.  No nausea, no vomiting.  No diarrhea.  No constipation. Musculoskeletal: Negative for musculoskeletal pain. Skin: Negative for rash, abrasions, lacerations, ecchymosis. Neurological: Positive for left-sided headache/left temporal pain.  Vision changes to the left eye.  Denies focal weakness or numbness. 10-point ROS otherwise negative.  ____________________________________________   PHYSICAL EXAM:  VITAL SIGNS: ED Triage Vitals  Enc Vitals Group     BP 10/15/19 1358 (!) 157/82     Pulse Rate 10/15/19 1358 84     Resp 10/15/19 1358 18     Temp 10/15/19 1358 98.3 F (36.8 C)     Temp Source 10/15/19 1358 Oral     SpO2 10/15/19 1358 98 %     Weight 10/15/19 1359 250 lb (113.4 kg)     Height 10/15/19 1359 5' 8"  (1.727 m)     Head Circumference --      Peak Flow --      Pain Score 10/15/19 1416 9     Pain Loc --      Pain Edu? --      Excl. in [place]? --      Constitutional: Alert and oriented. Well appearing and in no acute distress. Eyes: Conjunctivae are normal. PERRL. EOMI. funduscopic exam reveals red reflex bilaterally.  Vasculature and optic disc is unremarkable bilaterally. Head: Atraumatic.  No gross erythema or edema noted to the face or head region.  Patient is tender to palpation in the left temporal region with no specific palpable cords.  Palpation in this area elicits and reproduces  patient's pain.  No tenderness to palpation in other trigeminal nerve distribution to include across the forehead, across the cheek, jawline or behind the ear.  No other tenderness or palpable abnormality to the head or face. ENT:      Ears:       Nose: No congestion/rhinnorhea.      Mouth/Throat: Mucous membranes are moist.  Neck: No stridor.  No cervical spine tenderness to palpation.  Neck is supple full range of motion. Hematological/Lymphatic/Immunilogical: No cervical lymphadenopathy. Cardiovascular: Normal rate, regular rhythm. Normal S1 and S2.  Good peripheral circulation. Respiratory: Normal respiratory effort without tachypnea or retractions. Lungs CTAB. Good air entry to the bases with no decreased or absent breath sounds. Musculoskeletal: Full  range of motion to all extremities. No gross deformities appreciated. Neurologic:  Normal speech and language. No gross focal neurologic deficits are appreciated.  Cranial nerves II through XII grossly intact. Skin:  Skin is warm, dry and intact. No rash noted. Psychiatric: Mood and affect are normal. Speech and behavior are normal. Patient exhibits appropriate insight and judgement.   ____________________________________________   LABS (all labs ordered are listed, but only abnormal results are displayed)  Labs Reviewed  BASIC METABOLIC PANEL - Abnormal; Notable for the following components:      Result Value   Glucose, Bld 114 (*)    All other components within normal limits  CBC  SEDIMENTATION RATE  C-REACTIVE PROTEIN   ____________________________________________  EKG   ____________________________________________  RADIOLOGY I personally viewed and evaluated these images as part of my medical decision making, as well as reviewing the written report by the radiologist.  CT Angio Head W or Wo Contrast  Result Date: 10/15/2019 CLINICAL DATA:  Headache with normal neuro exam EXAM: CT ANGIOGRAPHY HEAD TECHNIQUE:  Multidetector CT imaging of the head was performed using the standard protocol during bolus administration of intravenous contrast. Multiplanar CT image reconstructions and MIPs were obtained to evaluate the vascular anatomy. CONTRAST:  78m OMNIPAQUE IOHEXOL 350 MG/ML SOLN COMPARISON:  None. FINDINGS: CT HEAD Brain: No evidence of acute infarction, hemorrhage, hydrocephalus, extra-axial collection or mass lesion/mass effect. Vascular: Negative for hyperdense vessel Skull: Negative Sinuses: Cyst right maxillary sinus otherwise clear. Orbits: Negative CTA HEAD Anterior circulation: Cavernous carotid widely patent bilaterally with mild atherosclerotic calcification. Negative for aneurysm. Anterior middle cerebral arteries widely patent bilaterally without stenosis or aneurysm. Posterior circulation: Both vertebral arteries patent to the basilar. PICA patent bilaterally. Basilar widely patent. Superior cerebellar and posterior cerebral arteries patent bilaterally. Negative for cerebral aneurysm. Venous sinuses: Limited venous contrast due to arterial phase scanning. Anatomic variants: None IMPRESSION: Normal CT head Normal CTA head.  No intracranial stenosis or vascular malformation Electronically Signed   By: CFranchot GalloM.D.   On: 10/15/2019 16:32    ____________________________________________    PROCEDURES  Procedure(s) performed:    Procedures    Medications  methylPREDNISolone sodium succinate (SOLU-MEDROL) 250 mg in sodium chloride 0.9 % 50 mL IVPB (has no administration in time range)  iohexol (OMNIPAQUE) 350 MG/ML injection 75 mL (75 mLs Intravenous Contrast Given 10/15/19 1608)     ____________________________________________   INITIAL IMPRESSION / ASSESSMENT AND PLAN / ED COURSE  Pertinent labs & imaging results that were available during my care of the patient were reviewed by me and considered in my medical decision making (see chart for details).  Review of the [place] CSRS was  performed in [place] of the [place] any controlled drugs.  Clinical Course as of Oct 14 1738  Fri Oct 15, 2019  1547 Patient presented to the emergency department complaining of left temporal headache, worsening over the past 1-1/2 months.  Patient states that he was seen by his primary care but referred to neurology for new onset headache.  Headache is constant, not alleviated by over-the-counter medications.  Patient has been taking aspirin for same.  Patient denies any trauma or injuries preceding his headache.  Patient denied any recent infectious complaints.  Patient is tender to palpation in the left temporal region.  At this time, I am most concerned for temporal arteritis given duration, symptoms, reproducible pain in the left temporal region.  Differential includes temporal arteritis, ischemic ocular neuritis, migraine, trigeminal neuralgia, brain  mass or lesion, cerebral aneurysm.  Patient will be evaluated with labs, imaging at this time.   [JC]    Clinical Course User Index [JC] , Charline Bills, PA-C          [name] diagnosis is consistent with temporal arteritis.  Patient presented to emergency department complaining of 1-1/2 months of temporal pain.  Patient describes this as a headache type pain, but he states that it feels more as if it is "outside for sprain" then inside.  Patient is able to push in the temporal region and elicit the symptoms.  Patient does have mild blurred vision to the left eye.  Overall exam is reassuring.  Patient is neurologically intact.  Funduscopic exam is reassuring.  Patient is tender to palpation in the right temporal region but no specific palpable cords.  [name] labs, imaging is reassuring at this time.  CT angio head without any acute findings.  ESR is not elevated but given patient's symptoms, ongoing pain x1.5 months I am concerned for temporal arteritis.  I will start the patient on high-dose steroids here in the  emergency department, continue the patient on prednisone as an outpatient and refer for biopsy.  I talked with vascular surgery, Dr. [name] will see the patient on Monday, schedule the patient for a biopsy.. Patient is given ED precautions to return to the ED for any worsening or new symptoms.     ____________________________________________  FINAL CLINICAL IMPRESSION(S) / ED DIAGNOSES  Final diagnoses:  Temporal arteritis ([place])      NEW MEDICATIONS STARTED DURING THIS VISIT:  ED Discharge Orders         Ordered    predniSONE (DELTASONE) 20 MG tablet  Daily with breakfast     10/15/19 1738              This chart was dictated using voice recognition software/Dragon. Despite best efforts to proofread, errors can occur which can change the meaning. Any change was purely unintentional.    Darletta Moll, PA-C 10/15/19 1740    Earleen Newport, MD 10/15/19 Lurline Hare

## 2019-10-16 LAB — C-REACTIVE PROTEIN: CRP: 0.7 mg/dL (ref <1.0)

## 2019-10-18 ENCOUNTER — Ambulatory Visit (INDEPENDENT_AMBULATORY_CARE_PROVIDER_SITE_OTHER): Payer: 59 | Admitting: Vascular Surgery

## 2019-10-18 ENCOUNTER — Other Ambulatory Visit (INDEPENDENT_AMBULATORY_CARE_PROVIDER_SITE_OTHER): Payer: Self-pay | Admitting: Nurse Practitioner

## 2019-10-18 ENCOUNTER — Other Ambulatory Visit: Payer: Self-pay

## 2019-10-18 ENCOUNTER — Encounter (INDEPENDENT_AMBULATORY_CARE_PROVIDER_SITE_OTHER): Payer: Self-pay | Admitting: Vascular Surgery

## 2019-10-18 VITALS — BP 162/82 | HR 82 | Resp 12 | Ht 68.0 in | Wt 251.0 lb

## 2019-10-18 DIAGNOSIS — I2510 Atherosclerotic heart disease of native coronary artery without angina pectoris: Secondary | ICD-10-CM

## 2019-10-18 DIAGNOSIS — R519 Headache, unspecified: Secondary | ICD-10-CM | POA: Diagnosis not present

## 2019-10-18 DIAGNOSIS — K219 Gastro-esophageal reflux disease without esophagitis: Secondary | ICD-10-CM

## 2019-10-18 DIAGNOSIS — K760 Fatty (change of) liver, not elsewhere classified: Secondary | ICD-10-CM | POA: Insufficient documentation

## 2019-10-18 DIAGNOSIS — I10 Essential (primary) hypertension: Secondary | ICD-10-CM | POA: Diagnosis not present

## 2019-10-18 DIAGNOSIS — A048 Other specified bacterial intestinal infections: Secondary | ICD-10-CM | POA: Insufficient documentation

## 2019-10-18 DIAGNOSIS — K2100 Gastro-esophageal reflux disease with esophagitis, without bleeding: Secondary | ICD-10-CM | POA: Insufficient documentation

## 2019-10-18 DIAGNOSIS — E7800 Pure hypercholesterolemia, unspecified: Secondary | ICD-10-CM

## 2019-10-18 MED ORDER — TRAMADOL HCL 50 MG PO TABS: 50.0000 mg | ORAL_TABLET | Freq: Four times a day (QID) | ORAL | 0 refills | Status: DC | PRN

## 2019-10-18 NOTE — [place] Notes (Signed)
MRN : [id number]  [name] [name] is a 59 y.o. (Jan 14, 1961) male who presents with chief complaint of  Chief Complaint  Patient presents with  . New Patient (Initial Visit)    Add on seen in the ED  .  History of Present Illness:   Chief complaint: left sided headache for more than a month  Location: Left temporal Character/quality of the symptom: Constant intense pain course Severity: Rates it a 10 out of 10 Duration: Since its onset it has been continuous lasting over a month Timing/onset: Abrupt in onset Aggravating/context: Length seems to make it worse Relieving/modifying: Any alleviating factors  The patient was seen on February 19 in the emergency room because the pain was so severe.  Work-up was performed which demonstrated elevation in inflammatory.  CT scan of the head was normal  No outpatient medications have been marked as taking for the 10/18/19 encounter (Office Visit) with [name], Dolores Lory, MD.    Past Medical History:  Diagnosis Date  . Alcohol use    regularly 3 beers/day  . CAD (coronary artery disease)    mild  . Dyslipidemia   . GERD (gastroesophageal reflux disease)    with esophagitis  . History of Helicobacter pylori infection   . Hypertension, essential   . Obesity, Class I, BMI 30-34.9 01/19/2016    Past Surgical History:  Procedure Laterality Date  . ANTERIOR CERVICAL DECOMP/DISCECTOMY FUSION  2012   C4-5 [facility])  . ANTERIOR CERVICAL DECOMP/DISCECTOMY FUSION N/A 03/12/2018   ACDF C4-6, EXPLORATION OF FUSION C6-7, REMOVAL OF HARDWARE;  [name], [name], MD)  . CARDIAC CATHETERIZATION  2013   no significant CAD, nl LVEF 60% [name])  . COLONOSCOPY  2012   WNL per PCP report  . CT CTA CORONARY W/CA SCORE W/CM &/OR WO/CM  2015   minor luminal irregularities, Ca score = 90 [name])  . KNEE ARTHROSCOPY Bilateral     Social History Social History   Tobacco Use  . Smoking status: Never Smoker  . Smokeless tobacco: Never Used  Substance Use  Topics  . Alcohol use: Yes    Alcohol/week: 0.0 standard drinks    Comment: 8 oz whiskey a night  . Drug use: No    Family History Family History  Problem Relation Age of Onset  . CAD Father 55       MI  . Hyperlipidemia Father   . Diabetes Paternal Grandfather   . Cancer Mother 75       deceased from bone cancer  . Stroke Neg Hx   No family history of bleeding/clotting disorders, porphyria or autoimmune disease   No Known Allergies   REVIEW OF SYSTEMS (Negative unless checked)  Constitutional: [] Weight loss  [] Fever  [] Chills Cardiac: [] Chest pain   [] Chest pressure   [] Palpitations   [] Shortness of breath when laying flat   [] Shortness of breath with exertion. Vascular:  [] Pain in legs with walking   [] Pain in legs at rest  [] History of DVT   [] Phlebitis   [] Swelling in legs   [] Varicose veins   [] Non-healing ulcers Pulmonary:   [] Uses home oxygen   [] Productive cough   [] Hemoptysis   [] Wheeze  [] COPD   [] Asthma Neurologic:  [] Dizziness   [] Seizures   [] History of stroke   [] History of TIA  [] Aphasia   [] Vissual changes   [] Weakness or numbness in arm   [] Weakness or numbness in leg Musculoskeletal:   [] Joint swelling   [] Joint pain   [] Low back  pain Hematologic:  [] Easy bruising  [] Easy bleeding   [] Hypercoagulable state   [] Anemic Gastrointestinal:  [] Diarrhea   [] Vomiting  [] Gastroesophageal reflux/heartburn   [] Difficulty swallowing. Genitourinary:  [] Chronic kidney disease   [] Difficult urination  [] Frequent urination   [] Blood in urine Skin:  [] Rashes   [] Ulcers  Psychological:  [] History of anxiety   []  History of major depression.  Physical Examination  Vitals:   10/18/19 1305  BP: (!) 162/82  Pulse: 82  Resp: 12  Weight: 251 lb (113.9 kg)  Height: 5\' 8"  (1.727 m)   Body mass index is 38.16 kg/m. Gen: WD/WN, NAD Head: [place]/AT, No temporalis wasting.  Ear/Nose/Throat: Hearing grossly intact, nares w/o erythema or drainage, poor dentition Eyes: PER, EOMI,  sclera nonicteric.  Neck: Supple, no masses.  No bruit or JVD.  Pulmonary:  Good air movement, clear to auscultation bilaterally, no use of accessory muscles.  Cardiac: RRR, normal S1, S2, no Murmurs. Vascular: palpable temporal pulses bilaterally Vessel Right Left  Radial Palpable Palpable  Brachial Palpable Palpable  Carotid Palpable Palpable  Gastrointestinal: soft, non-distended. No guarding/no peritoneal signs.  Musculoskeletal: M/S 5/5 throughout.  No deformity or atrophy.  Neurologic: CN 2-12 intact. Pain and light touch intact in extremities.  Symmetrical.  Speech is fluent. Motor exam as listed above. Psychiatric: Judgment intact, Mood & affect appropriate for pt's clinical situation. Dermatologic: No rashes or ulcers noted.  No changes consistent with cellulitis.  CBC Lab Results  Component Value Date   WBC 7.8 10/15/2019   HGB 14.6 10/15/2019   HCT 42.8 10/15/2019   MCV 93.0 10/15/2019   PLT 273 10/15/2019    BMET    Component Value Date/Time   NA 142 10/15/2019 1402   NA 135 (L) 11/29/2013 0812   K 3.6 10/15/2019 1402   K 3.6 11/29/2013 0812   CL 105 10/15/2019 1402   CL 103 11/29/2013 0812   CO2 28 10/15/2019 1402   CO2 27 11/29/2013 0812   GLUCOSE 114 (H) 10/15/2019 1402   GLUCOSE 132 (H) 11/29/2013 0812   BUN 19 10/15/2019 1402   BUN 11 11/29/2013 0812   CREATININE 0.87 10/15/2019 1402   CREATININE 0.95 10/08/2019 1550   CALCIUM 9.4 10/15/2019 1402   CALCIUM 8.5 11/29/2013 0812   GFRNONAA >60 10/15/2019 1402   GFRNONAA >60 11/29/2013 0812   GFRAA >60 10/15/2019 1402   GFRAA >60 11/29/2013 [id number]   Estimated Creatinine Clearance: 114.7 mL/min (by C-G formula based on SCr of 0.87 mg/dL).  COAG Lab Results  Component Value Date   INR 1.0 02/27/2018    Radiology CT Angio Head W or Wo Contrast  Result Date: 10/15/2019 CLINICAL DATA:  Headache with normal neuro exam EXAM: CT ANGIOGRAPHY HEAD TECHNIQUE: Multidetector CT imaging of the head was  performed using the standard protocol during bolus administration of intravenous contrast. Multiplanar CT image reconstructions and MIPs were obtained to evaluate the vascular anatomy. CONTRAST:  94mL OMNIPAQUE IOHEXOL 350 MG/ML SOLN COMPARISON:  None. FINDINGS: CT HEAD Brain: No evidence of acute infarction, hemorrhage, hydrocephalus, extra-axial collection or mass lesion/mass effect. Vascular: Negative for hyperdense vessel Skull: Negative Sinuses: Cyst right maxillary sinus otherwise clear. Orbits: Negative CTA HEAD Anterior circulation: Cavernous carotid widely patent bilaterally with mild atherosclerotic calcification. Negative for aneurysm. Anterior middle cerebral arteries widely patent bilaterally without stenosis or aneurysm. Posterior circulation: Both vertebral arteries patent to the basilar. PICA patent bilaterally. Basilar widely patent. Superior cerebellar and posterior cerebral arteries patent bilaterally. Negative for cerebral aneurysm. Venous  sinuses: Limited venous contrast due to arterial phase scanning. Anatomic variants: None IMPRESSION: Normal CT head Normal CTA head.  No intracranial stenosis or vascular malformation Electronically Signed   By: Franchot Gallo M.D.   On: 10/15/2019 16:32    Assessment/Plan 1. Left temporal headache Patient should undergo left temporal artery biopsy.    Risk and benefits were reviewed the patient.  Indications for the procedure were reviewed.  All questions were answered, the patient agrees to proceed.   2. Hypertension, essential Continue antihypertensive medications as already ordered, these medications have been reviewed and there are no changes at this time.   3. Coronary artery disease involving native coronary artery of native heart without angina pectoris Continue cardiac and antihypertensive medications as already ordered and reviewed, no changes at this time.  Continue statin as ordered and reviewed, no changes at this time  Nitrates  PRN for chest pain   4. Gastroesophageal reflux disease, unspecified whether esophagitis present Continue PPI as already ordered, this medication has been reviewed and there are no changes at this time.  Avoidence of caffeine and alcohol  Moderate elevation of the head of the bed   5. Hypercholesterolemia Continue statin as ordered and reviewed, no changes at this time     Hortencia Pilar, MD  10/18/2019 1:08 PM

## 2019-10-19 ENCOUNTER — Encounter
Admission: RE | Admit: 2019-10-19 | Discharge: 2019-10-19 | Disposition: A | Discharge status: Home / Self Care | Payer: 59 | Source: Ambulatory Visit | Attending: Vascular Surgery | Admitting: Vascular Surgery

## 2019-10-19 ENCOUNTER — Other Ambulatory Visit
Admission: RE | Admit: 2019-10-19 | Discharge: 2019-10-19 | Disposition: A | Discharge status: Home / Self Care | Payer: 59 | Source: Ambulatory Visit | Attending: Vascular Surgery | Admitting: Vascular Surgery

## 2019-10-19 DIAGNOSIS — Z01810 Encounter for preprocedural cardiovascular examination: Secondary | ICD-10-CM | POA: Diagnosis not present

## 2019-10-19 DIAGNOSIS — Z01812 Encounter for preprocedural laboratory examination: Secondary | ICD-10-CM | POA: Diagnosis not present

## 2019-10-19 DIAGNOSIS — Z20822 Contact with and (suspected) exposure to covid-19: Secondary | ICD-10-CM | POA: Diagnosis not present

## 2019-10-19 LAB — TYPE AND SCREEN
ABO/RH(D): A POS
Antibody Screen: NEGATIVE

## 2019-10-19 LAB — BASIC METABOLIC PANEL
Anion gap: 11 (ref 5–15)
BUN: 22 mg/dL — ABNORMAL HIGH (ref 6–20)
CO2: 26 mmol/L (ref 22–32)
Calcium: 9.4 mg/dL (ref 8.9–10.3)
Chloride: 100 mmol/L (ref 98–111)
Creatinine, Ser: 0.73 mg/dL (ref 0.61–1.24)
GFR calc Af Amer: >60 mL/min (ref >60)
GFR calc non Af Amer: >60 mL/min (ref >60)
Glucose, Bld: 131 mg/dL — ABNORMAL HIGH (ref 70–99)
Potassium: 3.6 mmol/L (ref 3.5–5.1)
Sodium: 137 mmol/L (ref 135–145)

## 2019-10-19 LAB — PROTIME-INR
INR: 1 (ref 0.8–1.2)
Prothrombin Time: 12.9 seconds (ref 11.4–15.2)

## 2019-10-19 LAB — CBC WITH DIFFERENTIAL/PLATELET
Abs Immature Granulocytes: 0.04 10*3/uL (ref 0.00–0.07)
Basophils Absolute: 0 10*3/uL (ref 0.0–0.1)
Basophils Relative: 0 %
Eosinophils Absolute: 0 10*3/uL (ref 0.0–0.5)
Eosinophils Relative: 0 %
HCT: 44.9 % (ref 39.0–52.0)
Hemoglobin: 15.9 g/dL (ref 13.0–17.0)
Immature Granulocytes: 0 %
Lymphocytes Relative: 10 %
Lymphs Abs: 0.9 10*3/uL (ref 0.7–4.0)
MCH: 31.7 pg (ref 26.0–34.0)
MCHC: 35.4 g/dL (ref 30.0–36.0)
MCV: 89.4 fL (ref 80.0–100.0)
Monocytes Absolute: 0.2 10*3/uL (ref 0.1–1.0)
Monocytes Relative: 2 %
Neutro Abs: 7.9 10*3/uL — ABNORMAL HIGH (ref 1.7–7.7)
Neutrophils Relative %: 88 %
Platelets: 286 10*3/uL (ref 150–400)
RBC: 5.02 MIL/uL (ref 4.22–5.81)
RDW: 12 % (ref 11.5–15.5)
WBC: 9 10*3/uL (ref 4.0–10.5)
nRBC: 0 % (ref 0.0–0.2)

## 2019-10-19 LAB — APTT: aPTT: 26 seconds (ref 24–36)

## 2019-10-19 LAB — SARS CORONAVIRUS 2 (TAT 6-24 HRS): SARS Coronavirus 2: NEGATIVE

## 2019-10-19 NOTE — Patient Instructions (Addendum)
Your procedure is scheduled on: Wednesday, Feb. 24 Report to Day Surgery on the 2nd floor of the [facility]. To find out your arrival time, please call [phone] between 1PM - 3PM on: Tuesday, Feb. 23  REMEMBER: Instructions that are not followed completely may result in serious medical risk, up to and including death; or upon the discretion of your surgeon and anesthesiologist your surgery may need to be rescheduled.  Do not eat food after midnight the night before surgery.  No gum chewing, lozengers or hard candies.  You may however, drink CLEAR liquids up to 2 hours before you are scheduled to arrive for your surgery. Do not drink anything within 2 hours of the start of your surgery.  Clear liquids include: - water  - apple juice without pulp - gatorade - black coffee or tea (Do NOT add milk or creamers to the coffee or tea) Do NOT drink anything that is not on this list.  No Alcohol for 24 hours before or after surgery.  No Smoking including e-cigarettes for 24 hours prior to surgery.  No chewable tobacco products for at least 6 hours prior to surgery.  No nicotine patches on the day of surgery.  On the morning of surgery brush your teeth with toothpaste and water, you may rinse your mouth with mouthwash if you wish. Do not swallow any toothpaste or mouthwash.  Notify your doctor if there is any change in your medical condition (cold, fever, infection).  Do not wear jewelry, make-up, hairpins, clips or nail polish.  Do not wear lotions, powders, or perfumes.   Do not shave 48 hours prior to surgery.   Contacts and dentures may not be worn into surgery.  Do not bring valuables to the hospital, including drivers license, insurance or credit cards.  [facility] is not responsible for any belongings or valuables.   TAKE THESE MEDICATIONS THE MORNING OF SURGERY:  1.  Omeprazole - (take one the night before and one on the morning of surgery - helps to prevent nausea  after surgery.) 2.  Prednisone  2.  Tramadol if needed for pain  Stop ASPIRIN and Anti-inflammatories (NSAIDS) such as Advil, Aleve, Ibuprofen, Motrin, Naproxen, Naprosyn and Aspirin based products such as Excedrin, Goodys Powder, BC Powder. (May take Tylenol or Acetaminophen if needed.)  Stop ANY OVER THE COUNTER supplements until after surgery. (May continue Vitamin D, Vitamin B, and multivitamin.)  Wear comfortable clothing (specific to your surgery type) to the hospital.  If you are being discharged the day of surgery, you will not be allowed to drive home. You will need a responsible adult to drive you home and stay with you that night.   If you are taking public transportation, you will need to have a responsible adult with you. Please confirm with your physician that it is acceptable to use public transportation.   Please call [phone] if you have any questions about these instructions.

## 2019-10-20 ENCOUNTER — Encounter
Admission: RE | Disposition: A | Discharge status: Home / Self Care | Payer: Self-pay | Source: Home / Self Care | Attending: Vascular Surgery

## 2019-10-20 ENCOUNTER — Encounter (INDEPENDENT_AMBULATORY_CARE_PROVIDER_SITE_OTHER): Payer: Self-pay | Admitting: Vascular Surgery

## 2019-10-20 ENCOUNTER — Other Ambulatory Visit: Payer: Self-pay

## 2019-10-20 ENCOUNTER — Ambulatory Visit: Payer: 59 | Admitting: Anesthesiology

## 2019-10-20 ENCOUNTER — Ambulatory Visit
Admission: RE | Admit: 2019-10-20 | Discharge: 2019-10-20 | Disposition: A | Discharge status: Home / Self Care | Payer: 59 | Attending: Vascular Surgery | Admitting: Vascular Surgery

## 2019-10-20 DIAGNOSIS — I2510 Atherosclerotic heart disease of native coronary artery without angina pectoris: Secondary | ICD-10-CM | POA: Insufficient documentation

## 2019-10-20 DIAGNOSIS — Z808 Family history of malignant neoplasm of other organs or systems: Secondary | ICD-10-CM | POA: Insufficient documentation

## 2019-10-20 DIAGNOSIS — I10 Essential (primary) hypertension: Secondary | ICD-10-CM | POA: Insufficient documentation

## 2019-10-20 DIAGNOSIS — Z8249 Family history of ischemic heart disease and other diseases of the circulatory system: Secondary | ICD-10-CM | POA: Insufficient documentation

## 2019-10-20 DIAGNOSIS — E785 Hyperlipidemia, unspecified: Secondary | ICD-10-CM | POA: Diagnosis not present

## 2019-10-20 DIAGNOSIS — R519 Headache, unspecified: Secondary | ICD-10-CM | POA: Insufficient documentation

## 2019-10-20 DIAGNOSIS — Z981 Arthrodesis status: Secondary | ICD-10-CM | POA: Insufficient documentation

## 2019-10-20 DIAGNOSIS — Z833 Family history of diabetes mellitus: Secondary | ICD-10-CM | POA: Diagnosis not present

## 2019-10-20 DIAGNOSIS — E7800 Pure hypercholesterolemia, unspecified: Secondary | ICD-10-CM | POA: Insufficient documentation

## 2019-10-20 DIAGNOSIS — M1990 Unspecified osteoarthritis, unspecified site: Secondary | ICD-10-CM | POA: Insufficient documentation

## 2019-10-20 DIAGNOSIS — E669 Obesity, unspecified: Secondary | ICD-10-CM | POA: Diagnosis not present

## 2019-10-20 DIAGNOSIS — R51 Headache with orthostatic component, not elsewhere classified: Secondary | ICD-10-CM

## 2019-10-20 DIAGNOSIS — Z6838 Body mass index (BMI) 38.0-38.9, adult: Secondary | ICD-10-CM | POA: Diagnosis not present

## 2019-10-20 DIAGNOSIS — Z823 Family history of stroke: Secondary | ICD-10-CM | POA: Insufficient documentation

## 2019-10-20 DIAGNOSIS — K219 Gastro-esophageal reflux disease without esophagitis: Secondary | ICD-10-CM | POA: Insufficient documentation

## 2019-10-20 HISTORY — PX: ARTERY BIOPSY: SHX891

## 2019-10-20 LAB — ABO/RH: ABO/RH(D): A POS

## 2019-10-20 SURGERY — BIOPSY TEMPORAL ARTERY
Anesthesia: General | Laterality: Left

## 2019-10-20 MED ORDER — LACTATED RINGERS IV SOLN: INTRAVENOUS | Status: DC

## 2019-10-20 MED ORDER — ACETAMINOPHEN 10 MG/ML IV SOLN
INTRAVENOUS | Status: DC | PRN
  Administered 2019-10-20: 1000 mg via INTRAVENOUS

## 2019-10-20 MED ORDER — CHLORHEXIDINE GLUCONATE CLOTH 2 % EX PADS: 6.0000 | MEDICATED_PAD | Freq: Once | CUTANEOUS | Status: DC

## 2019-10-20 MED ORDER — LIDOCAINE-EPINEPHRINE 1 %-1:100000 IJ SOLN
INTRAMUSCULAR | Status: AC
  Filled 2019-10-20: qty 1

## 2019-10-20 MED ORDER — DEXAMETHASONE SODIUM PHOSPHATE 10 MG/ML IJ SOLN
INTRAMUSCULAR | Status: DC | PRN
  Administered 2019-10-20: 10 mg via INTRAVENOUS

## 2019-10-20 MED ORDER — FENTANYL CITRATE (PF) 100 MCG/2ML IJ SOLN
INTRAMUSCULAR | Status: AC
  Filled 2019-10-20: qty 2

## 2019-10-20 MED ORDER — PROPOFOL 10 MG/ML IV BOLUS
INTRAVENOUS | Status: AC
  Filled 2019-10-20: qty 20

## 2019-10-20 MED ORDER — MIDAZOLAM HCL 2 MG/2ML IJ SOLN
INTRAMUSCULAR | Status: DC | PRN
  Administered 2019-10-20: 2 mg via INTRAVENOUS

## 2019-10-20 MED ORDER — PHENYLEPHRINE HCL (PRESSORS) 10 MG/ML IV SOLN
INTRAVENOUS | Status: AC
  Filled 2019-10-20: qty 1

## 2019-10-20 MED ORDER — ONDANSETRON HCL 4 MG/2ML IJ SOLN
INTRAMUSCULAR | Status: DC | PRN
  Administered 2019-10-20: 4 mg via INTRAVENOUS

## 2019-10-20 MED ORDER — GLYCOPYRROLATE 0.2 MG/ML IJ SOLN
INTRAMUSCULAR | Status: DC | PRN
  Administered 2019-10-20: .2 mg via INTRAVENOUS

## 2019-10-20 MED ORDER — LIDOCAINE-EPINEPHRINE 1 %-1:100000 IJ SOLN
INTRAMUSCULAR | Status: DC | PRN
  Administered 2019-10-20: 6 mL

## 2019-10-20 MED ORDER — ONDANSETRON HCL 4 MG/2ML IJ SOLN: 4.0000 mg | Freq: Once | INTRAMUSCULAR | Status: DC | PRN

## 2019-10-20 MED ORDER — BUPIVACAINE HCL (PF) 0.5 % IJ SOLN
INTRAMUSCULAR | Status: DC | PRN
  Administered 2019-10-20: 7 mL

## 2019-10-20 MED ORDER — PROPOFOL 500 MG/50ML IV EMUL
INTRAVENOUS | Status: AC
  Filled 2019-10-20: qty 50

## 2019-10-20 MED ORDER — PROPOFOL 500 MG/50ML IV EMUL
INTRAVENOUS | Status: DC | PRN
  Administered 2019-10-20: 150 ug/kg/min via INTRAVENOUS

## 2019-10-20 MED ORDER — FENTANYL CITRATE (PF) 100 MCG/2ML IJ SOLN: 25.0000 ug | INTRAMUSCULAR | Status: DC | PRN

## 2019-10-20 MED ORDER — GLYCOPYRROLATE 0.2 MG/ML IJ SOLN
INTRAMUSCULAR | Status: AC
  Filled 2019-10-20: qty 1

## 2019-10-20 MED ORDER — MIDAZOLAM HCL 2 MG/2ML IJ SOLN
INTRAMUSCULAR | Status: AC
  Filled 2019-10-20: qty 2

## 2019-10-20 MED ORDER — CEFAZOLIN SODIUM-DEXTROSE 2-4 GM/100ML-% IV SOLN
2.0000 g | INTRAVENOUS | Status: AC
  Administered 2019-10-20: 2 g via INTRAVENOUS

## 2019-10-20 MED ORDER — DEXAMETHASONE SODIUM PHOSPHATE 10 MG/ML IJ SOLN
INTRAMUSCULAR | Status: AC
  Filled 2019-10-20: qty 1

## 2019-10-20 MED ORDER — ONDANSETRON HCL 4 MG/2ML IJ SOLN
INTRAMUSCULAR | Status: AC
  Filled 2019-10-20: qty 2

## 2019-10-20 MED ORDER — BUPIVACAINE HCL (PF) 0.5 % IJ SOLN
INTRAMUSCULAR | Status: AC
  Filled 2019-10-20: qty 30

## 2019-10-20 MED ORDER — EPHEDRINE SULFATE 50 MG/ML IJ SOLN
INTRAMUSCULAR | Status: AC
  Filled 2019-10-20: qty 1

## 2019-10-20 MED ORDER — CEFAZOLIN SODIUM-DEXTROSE 2-4 GM/100ML-% IV SOLN
INTRAVENOUS | Status: AC
  Filled 2019-10-20: qty 100

## 2019-10-20 SURGICAL SUPPLY — 38 items
BLADE CLIPPER SURG (BLADE) ×2 IMPLANT
BLADE SURG 15 STRL LF DISP TIS (BLADE) ×1 IMPLANT
BLADE SURG 15 STRL SS (BLADE) ×1
COTTON BALL STRL MEDIUM (GAUZE/BANDAGES/DRESSINGS) ×2 IMPLANT
COVER WAND RF STERILE (DRAPES) ×2 IMPLANT
DERMABOND ADVANCED (GAUZE/BANDAGES/DRESSINGS) ×1
DERMABOND ADVANCED .7 DNX12 (GAUZE/BANDAGES/DRESSINGS) ×1 IMPLANT
DRAPE LAPAROTOMY 77X122 PED (DRAPES) ×2 IMPLANT
DRSG TELFA 4X3 1S NADH ST (GAUZE/BANDAGES/DRESSINGS) ×2 IMPLANT
ELECT CAUTERY BLADE 6.4 (BLADE) ×2 IMPLANT
ELECT REM PT RETURN 9FT ADLT (ELECTROSURGICAL) ×2
ELECTRODE REM PT RTRN 9FT ADLT (ELECTROSURGICAL) ×1 IMPLANT
GLOVE BIO SURGEON STRL SZ7 (GLOVE) ×2 IMPLANT
GLOVE INDICATOR 7.5 STRL GRN (GLOVE) ×2 IMPLANT
GLOVE SURG SYN 8.0 (GLOVE) ×2 IMPLANT
GLOVE SURG SYN 8.0 PF PI (GLOVE) ×1 IMPLANT
GOWN STRL REUS W/ TWL LRG LVL3 (GOWN DISPOSABLE) ×2 IMPLANT
GOWN STRL REUS W/ TWL XL LVL3 (GOWN DISPOSABLE) ×1 IMPLANT
GOWN STRL REUS W/TWL LRG LVL3 (GOWN DISPOSABLE) ×2
GOWN STRL REUS W/TWL XL LVL3 (GOWN DISPOSABLE) ×1
LABEL OR SOLS (LABEL) ×2 IMPLANT
NDL HYPO 25X1 1.5 SAFETY (NEEDLE) ×2 IMPLANT
NEEDLE HYPO 25X1 1.5 SAFETY (NEEDLE) ×4 IMPLANT
NS IRRIG 500ML POUR BTL (IV SOLUTION) ×2 IMPLANT
PACK BASIN MINOR ARMC (MISCELLANEOUS) ×2 IMPLANT
SOL PREP PVP 2OZ (MISCELLANEOUS) ×2
SOLUTION PREP PVP 2OZ (MISCELLANEOUS) ×1 IMPLANT
SUCTION FRAZIER HANDLE 10FR (MISCELLANEOUS) ×1
SUCTION TUBE FRAZIER 10FR DISP (MISCELLANEOUS) ×1 IMPLANT
SUT MNCRL AB 4-0 PS2 18 (SUTURE) ×2 IMPLANT
SUT SILK 3 0 (SUTURE) ×1
SUT SILK 3-0 18XBRD TIE 12 (SUTURE) ×1 IMPLANT
SUT SILK 4 0 (SUTURE) ×1
SUT SILK 4-0 18XBRD TIE 12 (SUTURE) ×1 IMPLANT
SUT VIC AB 3-0 SH 27 (SUTURE) ×1
SUT VIC AB 3-0 SH 27X BRD (SUTURE) ×1 IMPLANT
SYR 10ML LL (SYRINGE) ×4 IMPLANT
SYR BULB IRRIG 60ML STRL (SYRINGE) ×2 IMPLANT

## 2019-10-20 NOTE — Anesthesia Procedure Notes (Signed)
Procedure Name: MAC Performed by: Kelton Pillar, CRNA Pre-anesthesia Checklist: Patient identified, Emergency Drugs available, Suction available, Timeout performed and Patient being monitored Patient Re-evaluated:Patient Re-evaluated prior to induction Oxygen Delivery Method: Non-rebreather mask

## 2019-10-20 NOTE — H&P (Signed)
[facility] History & Physical Update  The patient was interviewed and re-examined.  The patient's previous History and Physical has been reviewed and is unchanged.  There is no change in the plan of care. We plan to proceed with the scheduled procedure.  Hortencia Pilar, MD  10/20/2019, 7:19 AM

## 2019-10-20 NOTE — Transfer of Care (Addendum)
Immediate Anesthesia Transfer of Care Note  Patient: [name] [name]  Procedure(s) Performed: BIOPSY TEMPORAL ARTERY (Left )  Patient Location: PACU  Anesthesia Type:MAC  Level of Consciousness: awake, alert , oriented and patient cooperative  Airway & Oxygen Therapy: Patient Spontanous Breathing and Patient connected to face mask oxygen  Post-op Assessment: Report given to RN and Post -op Vital signs reviewed and stable  Post vital signs: Reviewed and stable  Last Vitals:  Vitals Value Taken Time  BP 95/64 10/20/19 0827  Temp    Pulse 69 10/20/19 0828  Resp 16 10/20/19 0828  SpO2 96 % 10/20/19 0828  Vitals shown include unvalidated device data.  Last Pain:  Vitals:   10/20/19 0622  TempSrc: Tympanic  PainSc: 8          Complications: No apparent anesthesia complications

## 2019-10-20 NOTE — Anesthesia Postprocedure Evaluation (Signed)
Anesthesia Post Note  Patient: [name] [name]  Procedure(s) Performed: BIOPSY TEMPORAL ARTERY (Left )  Patient location during evaluation: PACU Anesthesia Type: General Level of consciousness: awake and alert and oriented Pain management: pain level controlled Vital Signs Assessment: post-procedure vital signs reviewed and stable Respiratory status: spontaneous breathing, nonlabored ventilation and respiratory function stable Cardiovascular status: blood pressure returned to baseline and stable Postop Assessment: no signs of nausea or vomiting Anesthetic complications: no     Last Vitals:  Vitals:   10/20/19 0858 10/20/19 0914  BP: 106/63 107/66  Pulse: 68 64  Resp: 15 16  Temp: 36.6 C 36.6 C  SpO2: 94% 95%    Last Pain:  Vitals:   10/20/19 0914  TempSrc: Temporal  PainSc:                  Elisabet Gutzmer

## 2019-10-20 NOTE — Discharge Instructions (Signed)
Outpatient Surgery, Adult, Care After These instructions provide you with information about caring for yourself after your procedure. Your health care provider may also give you more specific instructions. Your treatment has been planned according to current medical practices, but problems sometimes occur. Call your health care provider if you have any problems or questions after your procedure. What can I expect after the procedure? After the procedure, it is common to have:  Tenderness and numbness at the surgical site.  Swelling and bruising around the surgical site.  Nausea. Follow these instructions at home:     For at least 24 hours after the procedure:  Have a responsible adult stay with you. It is important to have someone help care for you until you are awake and alert.  Rest as needed.  Do not: ? Participate in activities in which you could fall or become injured. ? Drive. ? Use heavy machinery. ? Drink alcohol. ? Take sleeping pills or medicines that cause drowsiness. ? Make important decisions or sign legal documents. ? Take care of children on your own. Activity  Return to your normal activities as told by your health care provider. Ask your health care provider what activities are safe for you.  Do not lift anything that is heavier than 10 lb (4.5 kg), or the limit that your health care provider tells you, until your health care provider says it is okay.  Do not play contact sports until your health care provider says it is okay. Incision care  Follow instructions from your health care provider about how to take care of an incision, if you have one. Make sure you: ? Wash your hands with soap and water before you change your bandage (dressing). If soap and water are not available, use hand sanitizer. ? Change your dressing as told by your health care provider. ? Leave stitches (sutures), skin glue, or adhesive strips in place. These skin closures may need to stay  in place for 2 weeks or longer. If adhesive strip edges start to loosen and curl up, you may trim the loose edges. Do not remove adhesive strips completely unless your health care provider tells you to do that.  Check your incision area every day for signs of infection. Check for: ? More redness, swelling, or pain. ? More fluid or blood. ? Warmth. ? Pus or a bad smell. Medicines  Take over-the-counter and prescription medicines only as told by your health care provider.  Do not drive or use heavy machinery while taking prescription pain medicines. Eating and drinking  Follow the diet recommended by your health care provider.  When you are hungry, begin eating light and bland foods such as toast. Gradually return to your regular diet.  If you vomit: ? Drink water, juice, or soup when you can drink without vomiting. ? Make sure you have little or no nausea before eating solid foods. General instructions  If you have sleep apnea, surgery and certain medicines can increase your risk for breathing problems. Follow instructions from your HCP about wearing your sleep device: ? Anytime you are sleeping, including during daytime naps. ? While taking prescription pain medicines, sleeping medicines, or medicines that make you drowsy.  Do not use any tobacco products, such as cigarettes, chewing tobacco, and e-cigarettes, for as long as possible.  If you smoke, do not smoke without supervision.  Keep all follow-up visits as told by your health care provider. This is important. Contact a health care provider if:  You  have more redness, swelling, or pain around your incision.  You have more fluid or blood coming from your incision.  Your incision feels warm to the touch.  You have pus or a bad smell coming from your incision.  You have a fever.  You feel light-headed or you faint.  You develop a rash.  You keep feeling nauseous or keep vomiting.  You have very bad pain, even after  taking the medicines your health care provider has prescribed or recommended.  You have constipation. Get help right away if:  You are unable to pass urine.  You have trouble breathing. Summary  Have a responsible adult stay with you for at least 24 hours after the procedure.  Nausea is common after a procedure. Make sure you have little or no nausea before eating solid foods. Follow the diet recommended by your health care provider.  Ask your health care provider what activities are safe for you. This information is not intended to replace advice given to you by your health care provider. Make sure you discuss any questions you have with your health care provider. Document Revised: 11/10/2017 Document Reviewed: 12/03/2015 Elsevier Patient Education  [place].

## 2019-10-20 NOTE — Op Note (Signed)
        OPERATIVE NOTE   PRE-OPERATIVE DIAGNOSIS: suspected temporal arteritis, persistent left temporal headache  POST-OPERATIVE DIAGNOSIS: Same as above  PROCEDURE: 1.   Left temporal artery biopsy  SURGEON: Hortencia Pilar, MD  ASSISTANT(S): None  ANESTHESIA: MAC  ESTIMATED BLOOD LOSS: Minimal  FINDING(S): 1.  none  SPECIMEN(S):  Left superficial temporal artery sent to pathology  INDICATIONS:   Patient is a 59 y.o. male who presents with severe left temporal headache.  We were consulted by the emergency room physician for consideration for temporal artery biopsy. Risks and benefits were discussed and he was agreeable to proceed.  DESCRIPTION: After obtaining full informed written consent, the patient was brought back to the operating room and placed supine upon the operating table.  The patient received IV antibiotics prior to induction.  After obtaining adequate anesthesia, the patient was prepped and draped in the standard fashion. The area in front of his left ear was anesthetized copiously with a solution of 1% lidocaine and half percent Marcaine without epinephrine. I then made an incision just in front of the left ear overlying the palpable pulse. I then dissected down through the subcutaneous tissues and identified the superficial temporal artery. This was dissected out over a several centimeters and branches were ligated and divided between silk ties. Care was used to avoid electrocautery around the artery. I then clamped the artery proximally and distally and transected the artery. The specimen was then sent to pathology. The proximal and distal artery were ligated with 3-0 silk ties. Hemostasis was achieved. The wound was then closed with a series of interrupted 3-0 Vicryl's and the skin was closed with a 4-0 Monocryl. Sterile dressing was placed. The patient was taken to the recovery room in stable condition having tolerated the procedure well.  COMPLICATIONS:  None  CONDITION: Stable   Hortencia Pilar 10/20/2019 2:06 PM  This note was created with Dragon Medical transcription system. Any errors in dictation are purely unintentional.

## 2019-10-20 NOTE — Anesthesia Preprocedure Evaluation (Signed)
Anesthesia Evaluation  Patient identified by MRN, date of birth, ID band Patient awake    Reviewed: Allergy & Precautions, NPO status , [name] Chart, lab work & pertinent test results  History of Anesthesia Complications Negative for: history of anesthetic complications  Airway Mallampati: III  TM Distance: >3 FB Neck ROM: Full    Dental no notable dental hx.    Pulmonary neg pulmonary ROS, neg sleep apnea, neg COPD,    breath sounds clear to auscultation- rhonchi (-) wheezing      Cardiovascular hypertension, + CAD (nonocclusive)  (-) Past MI, (-) Cardiac Stents and (-) CABG  Rhythm:Regular Rate:Normal - Systolic murmurs and - Diastolic murmurs    Neuro/Psych  Headaches, neg Seizures negative psych ROS   GI/Hepatic Neg liver ROS, GERD  ,  Endo/Other  negative endocrine ROSneg diabetes  Renal/GU negative Renal ROS     Musculoskeletal  (+) Arthritis ,   Abdominal (+) + obese,   Peds  Hematology negative hematology ROS (+)   Anesthesia Other Findings Past Medical History: No date: Alcohol use     Comment:  regularly 3 beers/day No date: CAD (coronary artery disease)     Comment:  mild No date: Dyslipidemia No date: GERD (gastroesophageal reflux disease)     Comment:  with esophagitis No date: History of Helicobacter pylori infection No date: Hypertension, essential 01/19/2016: Obesity, Class I, BMI 30-34.9   Reproductive/Obstetrics                             Anesthesia Physical Anesthesia Plan  ASA: III  Anesthesia Plan: General   Post-op Pain Management:    Induction: Intravenous  PONV Risk Score and Plan: 1 and Propofol infusion  Airway Management Planned: Natural Airway  Additional Equipment:   Intra-op Plan:   Post-operative Plan:   Informed Consent: I have reviewed the patients History and Physical, chart, labs and discussed the procedure including the risks,  benefits and alternatives for the proposed anesthesia with the patient or authorized representative who has indicated his/her understanding and acceptance.     Dental advisory given  Plan Discussed with: CRNA and Anesthesiologist  Anesthesia Plan Comments:         Anesthesia Quick Evaluation

## 2019-10-21 ENCOUNTER — Encounter (INDEPENDENT_AMBULATORY_CARE_PROVIDER_SITE_OTHER): Payer: Self-pay | Admitting: Vascular Surgery

## 2019-10-21 LAB — SURGICAL PATHOLOGY

## 2019-10-22 ENCOUNTER — Ambulatory Visit
Admission: RE | Admit: 2019-10-22 | Discharge: 2019-10-22 | Disposition: A | Discharge status: Home / Self Care | Payer: 59 | Source: Ambulatory Visit | Attending: Family Medicine | Admitting: Family Medicine

## 2019-10-22 DIAGNOSIS — R7401 Elevation of levels of liver transaminase levels: Secondary | ICD-10-CM

## 2019-11-02 ENCOUNTER — Other Ambulatory Visit: Payer: Self-pay

## 2019-11-02 ENCOUNTER — Ambulatory Visit (INDEPENDENT_AMBULATORY_CARE_PROVIDER_SITE_OTHER): Payer: 59 | Admitting: Nurse Practitioner

## 2019-11-02 ENCOUNTER — Encounter (INDEPENDENT_AMBULATORY_CARE_PROVIDER_SITE_OTHER): Payer: Self-pay | Admitting: Nurse Practitioner

## 2019-11-02 VITALS — BP 127/71 | HR 76 | Resp 16 | Wt 239.6 lb

## 2019-11-02 DIAGNOSIS — R519 Headache, unspecified: Secondary | ICD-10-CM

## 2019-11-02 DIAGNOSIS — I8311 Varicose veins of right lower extremity with inflammation: Secondary | ICD-10-CM

## 2019-11-02 DIAGNOSIS — I8312 Varicose veins of left lower extremity with inflammation: Secondary | ICD-10-CM

## 2019-11-05 ENCOUNTER — Encounter (INDEPENDENT_AMBULATORY_CARE_PROVIDER_SITE_OTHER): Payer: Self-pay | Admitting: Nurse Practitioner

## 2019-11-05 NOTE — Progress Notes (Signed)
SUBJECTIVE:  Patient ID: [name] [name], male    DOB: [birth year], 59 y.o.   MRN: [id number] Chief Complaint  Patient presents with  . Follow-up    2week post temporal biopsy    HPI  [name] [name] is a 59 y.o. male that presents today post upper artery biopsy on 10/20/2019.  The patient recently went to the emergency room with debilitating headache on 10/15/2019.  This left-sided headache has been present for more than a month.  It was very abrupt in onset and the pain was suddenly worse.  The patient was started on prednisone and he states that the pain is still present however it has reduced from a 10 to about a 5.  A CT scan was normal.  His inflammatory markers were elevated on his lab work.  The temporal artery biopsy was negative for temporal arteritis.  The patient denies any issues with his scar currently.  It is well approximated and nearly healed.  The patient also has complaints of varicose veins bilaterally.  The patient has had these for a number of years however he is notes that the are recently getting much worse.  The patient has utilize conservative therapy for over a year with medical grade 1 compression stockings, exercise and elevation.  Despite this the patient continues to have pain discomfort in his bilateral lower extremities.  Past Medical History:  Diagnosis Date  . Alcohol use    regularly 3 beers/day  . CAD (coronary artery disease)    mild  . Dyslipidemia   . GERD (gastroesophageal reflux disease)    with esophagitis  . History of Helicobacter pylori infection   . Hypertension, essential   . Obesity, Class I, BMI 30-34.9 01/19/2016    Past Surgical History:  Procedure Laterality Date  . ANTERIOR CERVICAL DECOMP/DISCECTOMY FUSION  2012   C4-5 [facility])  . ANTERIOR CERVICAL DECOMP/DISCECTOMY FUSION N/A 03/12/2018   ACDF C4-6, EXPLORATION OF FUSION C6-7, REMOVAL OF HARDWARE;  [name] Infante, [name], MD)  . ARTERY BIOPSY Left 10/20/2019   Procedure: BIOPSY TEMPORAL  ARTERY;  Surgeon: [name] Cabal, MD;  Location: [facility] ORS;  Service: Vascular;  Laterality: Left;  . CARDIAC CATHETERIZATION  2013   no significant CAD, nl LVEF 60% [name])  . COLONOSCOPY  2012   WNL per PCP report  . CT CTA CORONARY W/CA SCORE W/CM &/OR WO/CM  2015   minor luminal irregularities, Ca score = 90 [name])  . KNEE ARTHROSCOPY Bilateral   . ROTATOR CUFF REPAIR Right 2019    Social History   Socioeconomic History  . Marital status: Married    Spouse name: Not on file  . Number of children: Not on file  . Years of education: Not on file  . Highest education level: Not on file  Occupational History  . Not on file  Tobacco Use  . Smoking status: Never Smoker  . Smokeless tobacco: Current User    Types: Chew  Substance and Sexual Activity  . Alcohol use: Yes    Alcohol/week: 0.0 standard drinks    Comment: 8 oz whiskey a night  . Drug use: No  . Sexual activity: Not on file  Other Topics Concern  . Not on file  Social History Narrative   Lives with wife, 1 dog   Edu: HS   Occ: maintenance   Activity: active at work   Diet: some water, fruits daily, beer   Social Determinants of Health   Financial Resource Strain:   .  Difficulty of Paying Living Expenses:   Food Insecurity:   . Worried About Charity fundraiser in the Last Year:   . Arboriculturist in the Last Year:   Transportation Needs:   . Film/video editor (Medical):   [name] Lack of Transportation (Non-Medical):   Physical Activity:   . Days of Exercise per Week:   . Minutes of Exercise per Session:   Stress:   . Feeling of Stress :   Social Connections:   . Frequency of Communication with Friends and Family:   . Frequency of Social Gatherings with Friends and Family:   . Attends Religious Services:   . Active Member of Clubs or Organizations:   . Attends Archivist Meetings:   [name] Marital Status:   Intimate Partner Violence:   . Fear of Current or Ex-Partner:   . Emotionally  Abused:   [name] Physically Abused:   . Sexually Abused:     Family History  Problem Relation Age of Onset  . CAD Father 61       MI  . Hyperlipidemia Father   . Diabetes Paternal Grandfather   . Cancer Mother 79       deceased from bone cancer  . Stroke Neg Hx     No Known Allergies   Review of Systems   Review of Systems: Negative Unless Checked Constitutional: [] Weight loss  [] Fever  [] Chills Cardiac: [] Chest pain   []  Atrial Fibrillation  [] Palpitations   [] Shortness of breath when laying flat   [] Shortness of breath with exertion. [] Shortness of breath at rest Vascular:  [] Pain in legs with walking   [] Pain in legs with standing [] Pain in legs when laying flat   [] Claudication    [] Pain in feet when laying flat    [] History of DVT   [] Phlebitis   [] Swelling in legs   [x] Varicose veins   [] Non-healing ulcers Pulmonary:   [] Uses home oxygen   [] Productive cough   [] Hemoptysis   [] Wheeze  [] COPD   [] Asthma Neurologic:  [] Dizziness   [] Seizures  [] Blackouts [] History of stroke   [] History of TIA  [] Aphasia   [] Temporary Blindness   [] Weakness or numbness in arm   [] Weakness or numbness in leg Musculoskeletal:   [] Joint swelling   [] Joint pain   [] Low back pain  []  History of Knee Replacement [] Arthritis [] back Surgeries  []  Spinal Stenosis    Hematologic:  [] Easy bruising  [] Easy bleeding   [] Hypercoagulable state   [] Anemic Gastrointestinal:  [] Diarrhea   [] Vomiting  [x] Gastroesophageal reflux/heartburn   [] Difficulty swallowing. [] Abdominal pain Genitourinary:  [] Chronic kidney disease   [] Difficult urination  [] Anuric   [] Blood in urine [] Frequent urination  [] Burning with urination   [] Hematuria Skin:  [] Rashes   [] Ulcers [] Wounds Psychological:  [] History of anxiety   []  History of major depression  []  Memory Difficulties      OBJECTIVE:   Physical Exam  BP 127/71 (BP Location: Right Arm)   Pulse 76   Resp 16   Wt 239 lb 9.6 oz (108.7 kg)   BMI 36.43 kg/m   Gen: WD/WN,  NAD Head: [place]/AT, No temporalis wasting.  Ear/Nose/Throat: Hearing grossly intact, nares w/o erythema or drainage Eyes: PER, EOMI, sclera nonicteric.  Neck: Supple, no masses.  No JVD.  Pulmonary:  Good air movement, no use of accessory muscles.  Cardiac: RRR Vascular:  Large varicosities bilateral lower extremities Vessel Right Left  Radial Palpable Palpable   Gastrointestinal: soft, non-distended. No  guarding/no peritoneal signs.  Musculoskeletal: M/S 5/5 throughout.  No deformity or atrophy.  Neurologic: Pain and light touch intact in extremities.  Symmetrical.  Speech is fluent. Motor exam as listed above. Psychiatric: Judgment intact, Mood & affect appropriate for pt's clinical situation. Dermatologic: No Venous rashes. No Ulcers Noted.  No changes consistent with cellulitis. Lymph : No Cervical lymphadenopathy, no lichenification or skin changes of chronic lymphedema.       ASSESSMENT AND PLAN:  1. Left temporal headache Currently the biopsy shows as negative for temporal arteritis.  Patient will continue with his PCP for further work-up.  2. Varicose veins of both lower extremities with inflammation  Recommend:  The patient is complaining of symptomatic varicose veins.  The patient describes them as painful, associated with swelling of both ankles and notes  this is interfering with daily activities and lifestyle.  I have had a long discussion with the patient regarding  varicose veins and why they cause symptoms.  Patient will continue wearing graduated compression stockings class 1 on a daily basis, beginning first thing in the morning and removing them in the evening. The patient is instructed specifically not to sleep in the stockings.    The patient  will also be continued using over-the-counter analgesics such as Motrin 600 mg po TID to help control the symptoms.    In addition, behavioral modification including elevation during the day will be continued.  The patient is  also instructed to continue exercising such as walking 4-5 times per week. - VAS [place] LOWER EXTREMITY VENOUS REFLUX; Future   Current Outpatient Medications on File Prior to Visit  Medication Sig Dispense Refill  . aspirin EC 325 MG tablet Take 650 mg by mouth daily as needed for moderate pain.    [name] atorvastatin (LIPITOR) 40 MG tablet Take 1 tablet (40 mg total) by mouth daily at 6 PM. 90 tablet 3  . chlorthalidone (HYGROTON) 25 MG tablet TAKE 1 TABLET(25 MG) BY MOUTH DAILY (Patient taking differently: Take 25 mg by mouth daily. ) 90 tablet 3  . Multiple Vitamin (MULTIVITAMIN WITH MINERALS) TABS tablet Take 1 tablet by mouth daily.    [name] omeprazole (PRILOSEC) 20 MG capsule Take 1 capsule (20 mg total) by mouth daily. 90 capsule 3  . predniSONE (DELTASONE) 20 MG tablet Take 3 tablets (60 mg total) by mouth daily with breakfast for 21 days. 63 tablet 0  . traMADol (ULTRAM) 50 MG tablet Take 1 tablet (50 mg total) by mouth every 6 (six) hours as needed for severe pain. 30 tablet 0   No current facility-administered medications on file prior to visit.    There are no Patient Instructions on file for this visit. No follow-ups on file.   Kris Hartmann, NP  This note was completed with Sales executive.  Any errors are purely unintentional.

## 2019-11-12 ENCOUNTER — Ambulatory Visit: Payer: Self-pay | Admitting: Neurology

## 2019-12-03 ENCOUNTER — Encounter (INDEPENDENT_AMBULATORY_CARE_PROVIDER_SITE_OTHER): Payer: 59

## 2019-12-03 ENCOUNTER — Ambulatory Visit (INDEPENDENT_AMBULATORY_CARE_PROVIDER_SITE_OTHER): Payer: 59 | Admitting: Nurse Practitioner

## 2019-12-10 ENCOUNTER — Ambulatory Visit (INDEPENDENT_AMBULATORY_CARE_PROVIDER_SITE_OTHER): Payer: 59 | Admitting: Nurse Practitioner

## 2019-12-10 ENCOUNTER — Encounter (INDEPENDENT_AMBULATORY_CARE_PROVIDER_SITE_OTHER): Payer: Self-pay | Admitting: Nurse Practitioner

## 2019-12-10 ENCOUNTER — Ambulatory Visit (INDEPENDENT_AMBULATORY_CARE_PROVIDER_SITE_OTHER): Payer: 59

## 2019-12-10 ENCOUNTER — Other Ambulatory Visit: Payer: Self-pay

## 2019-12-10 VITALS — BP 131/84 | HR 68 | Resp 16 | Ht 68.0 in | Wt 230.0 lb

## 2019-12-10 DIAGNOSIS — I10 Essential (primary) hypertension: Secondary | ICD-10-CM

## 2019-12-10 DIAGNOSIS — I8312 Varicose veins of left lower extremity with inflammation: Secondary | ICD-10-CM

## 2019-12-10 DIAGNOSIS — I83813 Varicose veins of bilateral lower extremities with pain: Secondary | ICD-10-CM

## 2019-12-10 DIAGNOSIS — I8311 Varicose veins of right lower extremity with inflammation: Secondary | ICD-10-CM

## 2019-12-10 NOTE — Progress Notes (Signed)
Subjective:    Patient ID: [name] [name], male    DOB: 27-Oct-1960, 59 y.o.   MRN: [id number] Chief Complaint  Patient presents with  . Follow-up    pt conv. bil le reflux     The patient is seen for evaluation of symptomatic varicose veins. The patient relates burning and stinging which worsened steadily throughout the course of the day, particularly with standing.  Previously the patient has worn compression stockings for several years which has helped mitigate the symptoms however recently even with compression stockings the symptoms are severe.  He takes NSAIDs for pain relief.  The patient also notes an aching and throbbing pain over the varicosities, particularly with prolonged dependent positions. The symptoms are significantly improved with elevation.  The patient also notes that during hot weather the symptoms are greatly intensified. The patient states the pain from the varicose veins interferes with work, daily exercise, shopping and household maintenance. At this point, the symptoms are persistent and severe enough that they're having a negative impact on lifestyle and are interfering with daily activities.    There is no history of DVT, PE or superficial thrombophlebitis. There is no history of ulceration or hemorrhage. The patient denies a significant family history of varicose veins.   There is no history of prior surgical intervention or sclerotherapy.  The patient has evidence of reflux in the right common femoral vein and mid femoral vein as well as in the left common femoral vein mid femoral vein and popliteal vein.  The patient also has reflux in the bilateral great saphenous veins at the saphenofemoral junction extending to the proximal calf.  In the right lower extremity the vein diameters measure from 0.48 cm to 0.67 cm.  In the left lower extremity the vein diameters measure from 0.63 cm to 0.97.       Review of Systems  Cardiovascular:       Pain over varicose veins   All other systems reviewed and are negative.      Objective:   Physical Exam Vitals reviewed.  HENT:     Head: Normocephalic.  Cardiovascular:     Rate and Rhythm: Normal rate and regular rhythm.     Comments: Large varicosities bilaterally measuring 5 to 6 mm Musculoskeletal:        General: Normal range of motion.     Cervical back: Normal range of motion.  Neurological:     Mental Status: He is alert and oriented to person, place, and time.  Psychiatric:        Mood and Affect: Mood normal.        Behavior: Behavior normal.        Thought Content: Thought content normal.        Judgment: Judgment normal.     BP 131/84 (BP Location: Right Arm)   Pulse 68   Resp 16   Ht 5\' 8"  (1.727 m)   Wt 230 lb (104.3 kg)   BMI 34.97 kg/m   Past Medical History:  Diagnosis Date  . Alcohol use    regularly 3 beers/day  . CAD (coronary artery disease)    mild  . Dyslipidemia   . GERD (gastroesophageal reflux disease)    with esophagitis  . History of Helicobacter pylori infection   . Hypertension, essential   . Obesity, Class I, BMI 30-34.9 01/19/2016    Social History   Socioeconomic History  . Marital status: Married    Spouse name: Not on file  .  Number of children: Not on file  . Years of education: Not on file  . Highest education level: Not on file  Occupational History  . Not on file  Tobacco Use  . Smoking status: Never Smoker  . Smokeless tobacco: Current User    Types: Chew  Substance and Sexual Activity  . Alcohol use: Yes    Alcohol/week: 0.0 standard drinks    Comment: 8 oz whiskey a night  . Drug use: No  . Sexual activity: Not on file  Other Topics Concern  . Not on file  Social History Narrative   Lives with wife, 1 dog   Edu: HS   Occ: maintenance   Activity: active at work   Diet: some water, fruits daily, beer   Social Determinants of Health   Financial Resource Strain:   . Difficulty of Paying Living Expenses:   Food Insecurity:     . Worried About Charity fundraiser in the Last Year:   . Arboriculturist in the Last Year:   Transportation Needs:   . Film/video editor (Medical):   [name] Lack of Transportation (Non-Medical):   Physical Activity:   . Days of Exercise per Week:   . Minutes of Exercise per Session:   Stress:   . Feeling of Stress :   Social Connections:   . Frequency of Communication with Friends and Family:   . Frequency of Social Gatherings with Friends and Family:   . Attends Religious Services:   . Active Member of Clubs or Organizations:   . Attends Archivist Meetings:   [name] Marital Status:   Intimate Partner Violence:   . Fear of Current or Ex-Partner:   . Emotionally Abused:   [name] Physically Abused:   . Sexually Abused:     Past Surgical History:  Procedure Laterality Date  . ANTERIOR CERVICAL DECOMP/DISCECTOMY FUSION  2012   C4-5 [facility])  . ANTERIOR CERVICAL DECOMP/DISCECTOMY FUSION N/A 03/12/2018   ACDF C4-6, EXPLORATION OF FUSION C6-7, REMOVAL OF HARDWARE;  [name], [name], MD)  . ARTERY BIOPSY Left 10/20/2019   Procedure: BIOPSY TEMPORAL ARTERY;  Surgeon: [name], MD;  Location: [facility] ORS;  Service: Vascular;  Laterality: Left;  . CARDIAC CATHETERIZATION  2013   no significant CAD, nl LVEF 60% [name])  . COLONOSCOPY  2012   WNL per PCP report  . CT CTA CORONARY W/CA SCORE W/CM &/OR WO/CM  2015   minor luminal irregularities, Ca score = 90 [name])  . KNEE ARTHROSCOPY Bilateral   . ROTATOR CUFF REPAIR Right 2019    Family History  Problem Relation Age of Onset  . CAD Father 13       MI  . Hyperlipidemia Father   . Diabetes Paternal Grandfather   . Cancer Mother 49       deceased from bone cancer  . Stroke Neg Hx     No Known Allergies     Assessment & Plan:   1. Varicose veins of both lower extremities with pain Recommend  I have reviewed my previous  discussion with the patient regarding  varicose veins and why they cause symptoms. Patient will  continue  wearing graduated compression stockings class 1 on a daily basis, beginning first thing in the morning and removing them in the evening.    In addition, behavioral modification including elevation during the day was again discussed and this will continue.  The patient has utilized over the counter pain medications and  has been exercising.  However, at this time conservative therapy has not alleviated the patient's symptoms of leg pain and swelling  Recommend: laser ablation of the right and  left great saphenous veins to eliminate the symptoms of pain and swelling of the lower extremities caused by the severe superficial venous reflux disease.   2. Hypertension, essential Blood pressure today.  Patient appropriate medications.  No changes needed.   Current Outpatient Medications on File Prior to Visit  Medication Sig Dispense Refill  . aspirin EC 325 MG tablet Take 650 mg by mouth daily as needed for moderate pain.    [name] atorvastatin (LIPITOR) 40 MG tablet Take 1 tablet (40 mg total) by mouth daily at 6 PM. 90 tablet 3  . chlorthalidone (HYGROTON) 25 MG tablet TAKE 1 TABLET(25 MG) BY MOUTH DAILY (Patient taking differently: Take 25 mg by mouth daily. ) 90 tablet 3  . Multiple Vitamin (MULTIVITAMIN WITH MINERALS) TABS tablet Take 1 tablet by mouth daily.    [name] omeprazole (PRILOSEC) 20 MG capsule Take 1 capsule (20 mg total) by mouth daily. 90 capsule 3  . traMADol (ULTRAM) 50 MG tablet Take 1 tablet (50 mg total) by mouth every 6 (six) hours as needed for severe pain. 30 tablet 0   No current facility-administered medications on file prior to visit.    There are no Patient Instructions on file for this visit. No follow-ups on file.   Kris Hartmann, NP

## 2020-01-20 ENCOUNTER — Ambulatory Visit (INDEPENDENT_AMBULATORY_CARE_PROVIDER_SITE_OTHER): Payer: 59 | Admitting: Vascular Surgery

## 2020-01-20 ENCOUNTER — Encounter (INDEPENDENT_AMBULATORY_CARE_PROVIDER_SITE_OTHER): Payer: Self-pay | Admitting: Vascular Surgery

## 2020-01-20 ENCOUNTER — Other Ambulatory Visit: Payer: Self-pay

## 2020-01-20 VITALS — BP 136/85 | HR 70 | Resp 16 | Wt 233.4 lb

## 2020-01-20 DIAGNOSIS — I83811 Varicose veins of right lower extremities with pain: Secondary | ICD-10-CM

## 2020-01-20 DIAGNOSIS — I83813 Varicose veins of bilateral lower extremities with pain: Secondary | ICD-10-CM

## 2020-01-20 NOTE — Progress Notes (Signed)
    MRN : [id number]  NARON COBURN is a 59 y.o. (1961-03-29) male who presents with chief complaint of No chief complaint on file. .    The patient's right lower extremity was sterilely prepped and draped.  The ultrasound machine was used to visualize the right great saphenous vein throughout its course.  A segment below the knee was selected for access.  The saphenous vein was accessed without difficulty using ultrasound guidance with a micropuncture needle.   An 0.018  wire was placed beyond the saphenofemoral junction through the sheath and the microneedle was removed.  The 65 cm sheath was then placed over the wire and the wire and dilator were removed.  The laser fiber was placed through the sheath and its tip was placed approximately 2 cm below the saphenofemoral junction.  Tumescent anesthesia was then created with a dilute lidocaine solution.  Laser energy was then delivered with constant withdrawal of the sheath and laser fiber.  Approximately 2165 Joules of energy were delivered over a length of 48 cm.  Sterile dressings were placed.  The patient tolerated the procedure well without complications.

## 2020-01-21 ENCOUNTER — Other Ambulatory Visit (INDEPENDENT_AMBULATORY_CARE_PROVIDER_SITE_OTHER): Payer: Self-pay | Admitting: Vascular Surgery

## 2020-01-21 DIAGNOSIS — I83813 Varicose veins of bilateral lower extremities with pain: Secondary | ICD-10-CM

## 2020-01-25 ENCOUNTER — Ambulatory Visit (INDEPENDENT_AMBULATORY_CARE_PROVIDER_SITE_OTHER): Payer: 59

## 2020-01-25 ENCOUNTER — Other Ambulatory Visit: Payer: Self-pay

## 2020-01-25 DIAGNOSIS — I83813 Varicose veins of bilateral lower extremities with pain: Secondary | ICD-10-CM

## 2020-02-17 ENCOUNTER — Ambulatory Visit (INDEPENDENT_AMBULATORY_CARE_PROVIDER_SITE_OTHER): Payer: 59 | Admitting: Vascular Surgery

## 2020-02-17 ENCOUNTER — Other Ambulatory Visit: Payer: Self-pay

## 2020-02-17 ENCOUNTER — Encounter (INDEPENDENT_AMBULATORY_CARE_PROVIDER_SITE_OTHER): Payer: Self-pay | Admitting: Vascular Surgery

## 2020-02-17 VITALS — BP 129/79 | HR 74 | Resp 16 | Wt 233.0 lb

## 2020-02-17 DIAGNOSIS — I83812 Varicose veins of left lower extremities with pain: Secondary | ICD-10-CM | POA: Diagnosis not present

## 2020-02-17 DIAGNOSIS — I83813 Varicose veins of bilateral lower extremities with pain: Secondary | ICD-10-CM

## 2020-02-21 ENCOUNTER — Other Ambulatory Visit (INDEPENDENT_AMBULATORY_CARE_PROVIDER_SITE_OTHER): Payer: Self-pay | Admitting: Vascular Surgery

## 2020-02-21 ENCOUNTER — Other Ambulatory Visit: Payer: Self-pay

## 2020-02-21 ENCOUNTER — Ambulatory Visit (INDEPENDENT_AMBULATORY_CARE_PROVIDER_SITE_OTHER): Payer: 59

## 2020-02-21 DIAGNOSIS — I83813 Varicose veins of bilateral lower extremities with pain: Secondary | ICD-10-CM

## 2020-02-28 ENCOUNTER — Encounter (INDEPENDENT_AMBULATORY_CARE_PROVIDER_SITE_OTHER): Payer: Self-pay | Admitting: Vascular Surgery

## 2020-02-28 NOTE — Progress Notes (Signed)
    MRN : [id number]  [name] [name] is a 59 y.o. (1960-11-26) male who presents with chief complaint of No chief complaint on file. .    The patient's left lower extremity was sterilely prepped and draped.  The ultrasound machine was used to visualize the left great saphenous vein throughout its course.  A segment at the knee was selected for access.  The saphenous vein was accessed without difficulty using ultrasound guidance with a micropuncture needle.   An 0.018  wire was placed beyond the saphenofemoral junction through the sheath and the microneedle was removed.  The 65 cm sheath was then placed over the wire and the wire and dilator were removed.  The laser fiber was placed through the sheath and its tip was placed approximately 2 cm below the saphenofemoral junction.  Tumescent anesthesia was then created with a dilute lidocaine solution.  Laser energy was then delivered with constant withdrawal of the sheath and laser fiber.  Approximately 1197 Joules of energy were delivered over a length of 27 cm.  Sterile dressings were placed.  The patient tolerated the procedure well without complications.

## 2020-03-13 DIAGNOSIS — R519 Headache, unspecified: Secondary | ICD-10-CM | POA: Insufficient documentation

## 2020-03-13 DIAGNOSIS — M542 Cervicalgia: Secondary | ICD-10-CM | POA: Insufficient documentation

## 2020-03-20 ENCOUNTER — Ambulatory Visit (INDEPENDENT_AMBULATORY_CARE_PROVIDER_SITE_OTHER): Payer: 59 | Admitting: Vascular Surgery

## 2020-03-20 ENCOUNTER — Other Ambulatory Visit: Payer: Self-pay

## 2020-03-20 ENCOUNTER — Encounter (INDEPENDENT_AMBULATORY_CARE_PROVIDER_SITE_OTHER): Payer: Self-pay | Admitting: Vascular Surgery

## 2020-03-20 VITALS — BP 142/87 | HR 79 | Resp 16 | Wt 238.0 lb

## 2020-03-20 DIAGNOSIS — I2510 Atherosclerotic heart disease of native coronary artery without angina pectoris: Secondary | ICD-10-CM

## 2020-03-20 DIAGNOSIS — I83813 Varicose veins of bilateral lower extremities with pain: Secondary | ICD-10-CM | POA: Diagnosis not present

## 2020-03-20 DIAGNOSIS — I10 Essential (primary) hypertension: Secondary | ICD-10-CM | POA: Diagnosis not present

## 2020-03-20 DIAGNOSIS — K219 Gastro-esophageal reflux disease without esophagitis: Secondary | ICD-10-CM

## 2020-03-20 DIAGNOSIS — E785 Hyperlipidemia, unspecified: Secondary | ICD-10-CM

## 2020-03-20 NOTE — Progress Notes (Signed)
MRN : [id number]  [name] [name] is a 59 y.o. (12-29-1960) male who presents with chief complaint of  Chief Complaint  Patient presents with  . Follow-up    post laser follow up  .  History of Present Illness:   The patient returns to the office for followup status post laser ablation of the bilateral saphenous veins.  The patient note significant improvement in the lower extremity pain but not resolution of the symptoms. The patient notes multiple residual varicosities bilaterally which continued to hurt with dependent positions and remained tender to palpation. The patient's swelling is minimally from preoperative status. The patient continues to wear graduated compression stockings on a daily basis but these are not eliminating the pain and discomfort. The patient continues to use over-the-counter anti-inflammatory medications to treat the pain and related symptoms but this has not given the patient relief. The patient notes the pain in the lower extremities is causing problems with daily exercise, problems at work and even with household activities such as preparing meals and doing dishes.  The patient is otherwise done well and there have been no complications related to the laser procedure or interval changes in the patient's overall   Post laser ultrasound shows successful ablation of the GSV bilaterally   Current Meds  Medication Sig  . aspirin EC 325 MG tablet Take 650 mg by mouth daily as needed for moderate pain.  [name] atorvastatin (LIPITOR) 40 MG tablet Take 1 tablet (40 mg total) by mouth daily at 6 PM.  . chlorthalidone (HYGROTON) 25 MG tablet TAKE 1 TABLET(25 MG) BY MOUTH DAILY (Patient taking differently: Take 25 mg by mouth daily. )  . gabapentin (NEURONTIN) 100 MG capsule Take by mouth.  . Multiple Vitamin (MULTIVITAMIN WITH MINERALS) TABS tablet Take 1 tablet by mouth daily.  [name] omeprazole (PRILOSEC) 20 MG capsule Take 1 capsule (20 mg total) by mouth daily.  . traMADol  (ULTRAM) 50 MG tablet Take 1 tablet (50 mg total) by mouth every 6 (six) hours as needed for severe pain.    Past Medical History:  Diagnosis Date  . Alcohol use    regularly 3 beers/day  . CAD (coronary artery disease)    mild  . Dyslipidemia   . GERD (gastroesophageal reflux disease)    with esophagitis  . History of Helicobacter pylori infection   . Hypertension, essential   . Obesity, Class I, BMI 30-34.9 01/19/2016    Past Surgical History:  Procedure Laterality Date  . ANTERIOR CERVICAL DECOMP/DISCECTOMY FUSION  2012   C4-5 [facility])  . ANTERIOR CERVICAL DECOMP/DISCECTOMY FUSION N/A 03/12/2018   ACDF C4-6, EXPLORATION OF FUSION C6-7, REMOVAL OF HARDWARE;  [name], [name], MD)  . ARTERY BIOPSY Left 10/20/2019   Procedure: BIOPSY TEMPORAL ARTERY;  Surgeon: [name], MD;  Location: [facility] ORS;  Service: Vascular;  Laterality: Left;  . CARDIAC CATHETERIZATION  2013   no significant CAD, nl LVEF 60% [name])  . COLONOSCOPY  2012   WNL per PCP report  . CT CTA CORONARY W/CA SCORE W/CM &/OR WO/CM  2015   minor luminal irregularities, Ca score = 90 [name])  . KNEE ARTHROSCOPY Bilateral   . ROTATOR CUFF REPAIR Right 2019    Social History Social History   Tobacco Use  . Smoking status: Never Smoker  . Smokeless tobacco: Current User    Types: Chew  Vaping Use  . Vaping Use: Never used  Substance Use Topics  . Alcohol use: Yes  Alcohol/week: 0.0 standard drinks    Comment: 8 oz whiskey a night  . Drug use: No    Family History Family History  Problem Relation Age of Onset  . CAD Father 80       MI  . Hyperlipidemia Father   . Diabetes Paternal Grandfather   . Cancer Mother 48       deceased from bone cancer  . Stroke Neg Hx     No Known Allergies   REVIEW OF SYSTEMS (Negative unless checked)  Constitutional: [] Weight loss  [] Fever  [] Chills Cardiac: [] Chest pain   [] Chest pressure   [] Palpitations   [] Shortness of breath when laying flat    [] Shortness of breath with exertion. Vascular:  [] Pain in legs with walking   [x] Pain in legs at rest  [] History of DVT   [] Phlebitis   [] Swelling in legs   [x] Varicose veins   [] Non-healing ulcers Pulmonary:   [] Uses home oxygen   [] Productive cough   [] Hemoptysis   [] Wheeze  [] COPD   [] Asthma Neurologic:  [] Dizziness   [] Seizures   [] History of stroke   [] History of TIA  [] Aphasia   [] Vissual changes   [] Weakness or numbness in arm   [] Weakness or numbness in leg Musculoskeletal:   [] Joint swelling   [] Joint pain   [] Low back pain Hematologic:  [] Easy bruising  [] Easy bleeding   [] Hypercoagulable state   [] Anemic Gastrointestinal:  [] Diarrhea   [] Vomiting  [] Gastroesophageal reflux/heartburn   [] Difficulty swallowing. Genitourinary:  [] Chronic kidney disease   [] Difficult urination  [] Frequent urination   [] Blood in urine Skin:  [] Rashes   [] Ulcers  Psychological:  [] History of anxiety   []  History of major depression.  Physical Examination  Vitals:   03/20/20 1556  BP: (!) 142/87  Pulse: 79  Resp: 16  Weight: (!) 238 lb (108 kg)   Body mass index is 36.19 kg/m. Gen: WD/WN, NAD Head: [place]/AT, No temporalis wasting.  Ear/Nose/Throat: Hearing grossly intact, nares w/o erythema or drainage Eyes: PER, EOMI, sclera nonicteric.  Neck: Supple, no large masses.   Pulmonary:  Good air movement, no audible wheezing bilaterally, no use of accessory muscles.  Cardiac: RRR, no JVD Vascular: Large varicosities present extensively greater than 10 mm bilaterally.  Mild venous stasis changes to the legs bilaterally.  2+ soft pitting edema Vessel Right Left  Radial Palpable Palpable  Gastrointestinal: Non-distended. No guarding/no peritoneal signs.  Musculoskeletal: M/S 5/5 throughout.  No deformity or atrophy.  Neurologic: CN 2-12 intact. Symmetrical.  Speech is fluent. Motor exam as listed above. Psychiatric: Judgment intact, Mood & affect appropriate for pt's clinical situation. Dermatologic: No  rashes or ulcers noted.  No changes consistent with cellulitis.   CBC Lab Results  Component Value Date   WBC 9.0 10/19/2019   HGB 15.9 10/19/2019   HCT 44.9 10/19/2019   MCV 89.4 10/19/2019   PLT 286 10/19/2019    BMET    Component Value Date/Time   NA 137 10/19/2019 1152   NA 135 (L) 11/29/2013 0812   K 3.6 10/19/2019 1152   K 3.6 11/29/2013 0812   CL 100 10/19/2019 1152   CL 103 11/29/2013 0812   CO2 26 10/19/2019 1152   CO2 27 11/29/2013 0812   GLUCOSE 131 (H) 10/19/2019 1152   GLUCOSE 132 (H) 11/29/2013 0812   BUN 22 (H) 10/19/2019 1152   BUN 11 11/29/2013 0812   CREATININE 0.73 10/19/2019 1152   CREATININE 0.95 10/08/2019 1550   CALCIUM 9.4 10/19/2019 1152  CALCIUM 8.5 11/29/2013 0812   GFRNONAA >60 10/19/2019 1152   GFRNONAA >60 11/29/2013 0812   GFRAA >60 10/19/2019 1152   GFRAA >60 11/29/2013 0812   CrCl cannot be calculated ([name] most recent lab result is older than the maximum 21 days allowed.).  COAG Lab Results  Component Value Date   INR 1.0 10/19/2019   INR 1.0 02/27/2018    Radiology VAS [place] LOWER EXTREMITY VENOUS POST ABLATION  Result Date: 02/21/2020  Lower Venous Reflux Study Indications: S/P Left GSV ablation. Other Indications: Right GSV ablation on 01/20/20; Left GSV ablation on 02/17/20. Performing Technologist: Blondell Reveal RT, RDMS, RVT  Examination Guidelines: A complete evaluation includes B-mode imaging, spectral Doppler, color Doppler, and power Doppler as needed of all accessible portions of each vessel. Bilateral testing is considered an integral part of a complete examination. Limited examinations for reoccurring indications may be performed as noted. The reflux portion of the exam is performed with the patient in reverse Trendelenburg. Significant venous reflux is defined as >500 ms in the superficial venous system, and >1 second in the deep venous system.   Summary: Left: - No evidence of deep vein thrombosis seen in the left  lower extremity, from the common femoral through the popliteal veins. - The left GSV appears partially occlusive focally at the proximal/mid(>8cm from SFJ) and mid thigh levels. The remainder of the visualized GSV and mid/distal thigh GSV branch varicosity appear patent.  *See table(s) above for measurements and observations. Electronically signed by Hortencia Pilar MD on 02/21/2020 at 5:23:20 PM.    Final       Assessment/Plan 1. Varicose veins of both lower extremities with pain Recommend:  The patient has had successful ablation of the previously incompetent saphenous venous system but still has persistent symptoms of pain and swelling that are having a negative impact on daily life and daily activities.  Patient should undergo injection sclerotherapy to treat the residual varicosities.  The risks, benefits and alternative therapies were reviewed in detail with the patient.  All questions were answered.  The patient agrees to proceed with sclerotherapy at their convenience.  The patient will continue wearing the graduated compression stockings and using the over-the-counter pain medications to treat her symptoms.    2. Hypertension, essential Continue antihypertensive medications as already ordered, these medications have been reviewed and there are no changes at this time.   3. Coronary artery disease involving native coronary artery of native heart without angina pectoris Continue cardiac and antihypertensive medications as already ordered and reviewed, no changes at this time.  Continue statin as ordered and reviewed, no changes at this time  Nitrates PRN for chest pain   4. Gastroesophageal reflux disease, unspecified whether esophagitis present Continue PPI as already ordered, this medication has been reviewed and there are no changes at this time.  Avoidence of caffeine and alcohol  Moderate elevation of the head of the bed   5. Dyslipidemia Continue statin as ordered and  reviewed, no changes at this time    Hortencia Pilar, MD  03/20/2020 4:05 PM

## 2020-03-22 ENCOUNTER — Encounter (INDEPENDENT_AMBULATORY_CARE_PROVIDER_SITE_OTHER): Payer: Self-pay | Admitting: Vascular Surgery

## 2020-04-19 ENCOUNTER — Ambulatory Visit (INDEPENDENT_AMBULATORY_CARE_PROVIDER_SITE_OTHER): Payer: 59 | Admitting: Vascular Surgery

## 2020-04-19 ENCOUNTER — Encounter (INDEPENDENT_AMBULATORY_CARE_PROVIDER_SITE_OTHER): Payer: Self-pay | Admitting: Vascular Surgery

## 2020-04-19 ENCOUNTER — Other Ambulatory Visit: Payer: Self-pay

## 2020-04-19 VITALS — BP 137/85 | HR 82 | Ht 68.0 in | Wt 241.0 lb

## 2020-04-19 DIAGNOSIS — I83813 Varicose veins of bilateral lower extremities with pain: Secondary | ICD-10-CM | POA: Diagnosis not present

## 2020-04-19 NOTE — Progress Notes (Signed)
Varicose veins of bilateral  lower extremity with inflammation (454.1  I83.10) Current Plans   Indication: Patient presents with symptomatic varicose veins of the bilateral  lower extremity.   Procedure: Sclerotherapy using hypertonic saline mixed with 1% Lidocaine was performed on the bilateral lower extremity. Compression wraps were placed. The patient tolerated the procedure well. 

## 2020-05-03 ENCOUNTER — Ambulatory Visit (INDEPENDENT_AMBULATORY_CARE_PROVIDER_SITE_OTHER): Payer: 59 | Admitting: Vascular Surgery

## 2020-05-31 ENCOUNTER — Ambulatory Visit (INDEPENDENT_AMBULATORY_CARE_PROVIDER_SITE_OTHER): Payer: 59 | Admitting: Vascular Surgery

## 2020-05-31 ENCOUNTER — Encounter (INDEPENDENT_AMBULATORY_CARE_PROVIDER_SITE_OTHER): Payer: Self-pay | Admitting: Vascular Surgery

## 2020-05-31 ENCOUNTER — Other Ambulatory Visit: Payer: Self-pay

## 2020-05-31 VITALS — BP 156/82 | HR 86 | Resp 16 | Wt 239.0 lb

## 2020-05-31 DIAGNOSIS — I83813 Varicose veins of bilateral lower extremities with pain: Secondary | ICD-10-CM | POA: Diagnosis not present

## 2020-05-31 DIAGNOSIS — I83811 Varicose veins of right lower extremities with pain: Secondary | ICD-10-CM

## 2020-05-31 DIAGNOSIS — I83812 Varicose veins of left lower extremities with pain: Secondary | ICD-10-CM

## 2020-05-31 NOTE — Progress Notes (Signed)
Varicose veins of bilateral  lower extremity with inflammation (454.1  I83.10) Current Plans   Indication: Patient presents with symptomatic varicose veins of the bilateral  lower extremity.   Procedure: Sclerotherapy using hypertonic saline mixed with 1% Lidocaine was performed on the bilateral lower extremity. Compression wraps were placed. The patient tolerated the procedure well. 

## 2020-06-20 ENCOUNTER — Encounter: Payer: Self-pay | Admitting: Family Medicine

## 2020-06-28 ENCOUNTER — Ambulatory Visit (INDEPENDENT_AMBULATORY_CARE_PROVIDER_SITE_OTHER): Payer: 59 | Admitting: Vascular Surgery

## 2020-07-12 ENCOUNTER — Emergency Department: Payer: 59

## 2020-07-12 ENCOUNTER — Emergency Department
Admission: EM | Admit: 2020-07-12 | Discharge: 2020-07-12 | Disposition: A | Discharge status: Home / Self Care | Payer: 59 | Attending: Emergency Medicine | Admitting: Emergency Medicine

## 2020-07-12 ENCOUNTER — Other Ambulatory Visit: Payer: Self-pay

## 2020-07-12 DIAGNOSIS — Z7982 Long term (current) use of aspirin: Secondary | ICD-10-CM | POA: Insufficient documentation

## 2020-07-12 DIAGNOSIS — Z79899 Other long term (current) drug therapy: Secondary | ICD-10-CM | POA: Diagnosis not present

## 2020-07-12 DIAGNOSIS — I10 Essential (primary) hypertension: Secondary | ICD-10-CM | POA: Insufficient documentation

## 2020-07-12 DIAGNOSIS — K219 Gastro-esophageal reflux disease without esophagitis: Secondary | ICD-10-CM | POA: Diagnosis not present

## 2020-07-12 DIAGNOSIS — I2510 Atherosclerotic heart disease of native coronary artery without angina pectoris: Secondary | ICD-10-CM | POA: Diagnosis not present

## 2020-07-12 DIAGNOSIS — R1031 Right lower quadrant pain: Secondary | ICD-10-CM | POA: Diagnosis not present

## 2020-07-12 LAB — COMPREHENSIVE METABOLIC PANEL
ALT: 39 U/L (ref 0–44)
AST: 28 U/L (ref 15–41)
Albumin: 4.3 g/dL (ref 3.5–5.0)
Alkaline Phosphatase: 98 U/L (ref 38–126)
Anion gap: 12 (ref 5–15)
BUN: 13 mg/dL (ref 6–20)
CO2: 28 mmol/L (ref 22–32)
Calcium: 9.6 mg/dL (ref 8.9–10.3)
Chloride: 96 mmol/L — ABNORMAL LOW (ref 98–111)
Creatinine, Ser: 0.89 mg/dL (ref 0.61–1.24)
GFR, Estimated: >60 mL/min (ref >60)
Glucose, Bld: 136 mg/dL — ABNORMAL HIGH (ref 70–99)
Potassium: 3.4 mmol/L — ABNORMAL LOW (ref 3.5–5.1)
Sodium: 136 mmol/L (ref 135–145)
Total Bilirubin: 0.8 mg/dL (ref 0.3–1.2)
Total Protein: 7.4 g/dL (ref 6.5–8.1)

## 2020-07-12 LAB — CBC
HCT: 46.2 % (ref 39.0–52.0)
Hemoglobin: 16.2 g/dL (ref 13.0–17.0)
MCH: 32.1 pg (ref 26.0–34.0)
MCHC: 35.1 g/dL (ref 30.0–36.0)
MCV: 91.5 fL (ref 80.0–100.0)
Platelets: 253 10*3/uL (ref 150–400)
RBC: 5.05 MIL/uL (ref 4.22–5.81)
RDW: 12.5 % (ref 11.5–15.5)
WBC: 6.7 10*3/uL (ref 4.0–10.5)
nRBC: 0 % (ref 0.0–0.2)

## 2020-07-12 LAB — LIPASE, BLOOD: Lipase: 21 U/L (ref 11–51)

## 2020-07-12 LAB — URINALYSIS, COMPLETE (UACMP) WITH MICROSCOPIC
Bacteria, UA: NONE SEEN
Bilirubin Urine: NEGATIVE
Glucose, UA: NEGATIVE mg/dL
Hgb urine dipstick: NEGATIVE
Ketones, ur: NEGATIVE mg/dL
Leukocytes,Ua: NEGATIVE
Nitrite: NEGATIVE
Protein, ur: NEGATIVE mg/dL
Specific Gravity, Urine: 1.011 (ref 1.005–1.030)
pH: 6 (ref 5.0–8.0)

## 2020-07-12 MED ORDER — TRAMADOL HCL 50 MG PO TABS: 50.0000 mg | ORAL_TABLET | Freq: Four times a day (QID) | ORAL | 0 refills | Status: AC | PRN

## 2020-07-12 MED ORDER — IBUPROFEN 600 MG PO TABS: 600.0000 mg | ORAL_TABLET | Freq: Four times a day (QID) | ORAL | 0 refills | Status: DC | PRN

## 2020-07-12 MED ORDER — IOHEXOL 300 MG/ML  SOLN
125.0000 mL | Freq: Once | INTRAMUSCULAR | Status: AC | PRN
  Administered 2020-07-12: 125 mL via INTRAVENOUS

## 2020-07-12 MED ORDER — IOHEXOL 9 MG/ML PO SOLN
1000.0000 mL | Freq: Once | ORAL | Status: AC | PRN
  Administered 2020-07-12: 1000 mL via ORAL

## 2020-07-12 NOTE — ED Notes (Signed)
Pt at CT

## 2020-07-12 NOTE — ED Notes (Addendum)
Pt presents to ED with c/o of RLQ pain that has been ongoing and increasing in pain for the past 2 weeks and states the pain has just become "unbearable" at this point. Pt states putting pressure on RLQ makes pain feel better. Pt denies fevers or chills at this time. Pt denies N/V/D. Pt is A&Ox4. Pt denies urinary symptoms at this time.

## 2020-07-12 NOTE — Discharge Instructions (Signed)
Take the ibuprofen as needed over the next several days, with the tramadol only if needed for more severe pain.  Follow-up with your regular doctor.  Return to the ER for new, worsening, or persistent severe pain, weakness or numbness, vomiting, fever, or any other new or worsening symptoms that concern you.

## 2020-07-12 NOTE — ED Notes (Signed)
D/C, new RX, and f/up discussed with pt, pt verbalized understanding. No distress noted .

## 2020-07-12 NOTE — ED Provider Notes (Addendum)
[facility] Emergency Department Provider Note ____________________________________________   First MD Initiated Contact with Patient 07/12/20 1015     (approximate)  I have reviewed the triage vital signs and the nursing notes.   HISTORY  Chief Complaint Abdominal Pain    HPI [name] [name] is a 59 y.o. male with PMH as noted below who presents with right lower quadrant abdominal pain over the last 2 weeks, acutely worsened over the last day, nonradiating, not associated with nausea or vomiting, fever chills, urinary symptoms, or diarrhea.  The patient denies any prior history of this pain.  He has had no prior abdominal surgeries.  Past Medical History:  Diagnosis Date  . Alcohol use    regularly 3 beers/day  . CAD (coronary artery disease)    mild  . Dyslipidemia   . GERD (gastroesophageal reflux disease)    with esophagitis  . History of Helicobacter pylori infection   . Hypertension, essential   . Obesity, Class I, BMI 30-34.9 01/19/2016    Patient Active Problem List   Diagnosis Date Noted  . Head and face pain 03/13/2020  . Neck pain 03/13/2020  . Reflux esophagitis 10/18/2019  . Helicobacter pylori gastrointestinal tract infection 10/18/2019  . Steatosis of liver 10/18/2019  . Left temporal headache 10/08/2019  . Cervical radiculopathy 12/02/2018  . Hypercholesterolemia 12/01/2018  . Pain of right shoulder joint on movement 08/28/2018  . Full thickness rotator cuff tear 07/03/2018  . Tendonitis of long head of biceps brachii of right shoulder 07/03/2018  . Bilateral shoulder pain 06/06/2018  . S/P cervical spinal fusion 03/12/2018  . DDD (degenerative disc disease), cervical 01/20/2018  . Acute chest pain 11/14/2016  . Encounter for general adult medical examination with abnormal findings 02/29/2016  . Alcohol use 02/29/2016  . Current drinker 02/29/2016  . Varicose veins of both lower extremities with pain 01/19/2016  . Severe  obesity (BMI 35.0-39.9) with comorbidity ([place]) 01/19/2016  . CAD (coronary artery disease)   . Dyslipidemia   . Hypertension, essential   . GERD (gastroesophageal reflux disease)   . Impingement syndrome of right shoulder 01/13/2016    Past Surgical History:  Procedure Laterality Date  . ANTERIOR CERVICAL DECOMP/DISCECTOMY FUSION  2012   C4-5 [facility])  . ANTERIOR CERVICAL DECOMP/DISCECTOMY FUSION N/A 03/12/2018   ACDF C4-6, EXPLORATION OF FUSION C6-7, REMOVAL OF HARDWARE;  [name], [name], MD)  . ARTERY BIOPSY Left 10/20/2019   TEMPORAL ARTERY BIOPSY - Schnier, Dolores Lory, MD  . CARDIAC CATHETERIZATION  2013   no significant CAD, nl LVEF 60% [name])  . COLONOSCOPY  2012   WNL per PCP report  . CT CTA CORONARY W/CA SCORE W/CM &/OR WO/CM  2015   minor luminal irregularities, Ca score = 90 [name])  . KNEE ARTHROSCOPY Bilateral   . ROTATOR CUFF REPAIR Right 2019    Prior to Admission medications   Medication Sig Start Date End Date Taking? Authorizing Provider  aspirin EC 325 MG tablet Take 650 mg by mouth daily as needed for moderate pain.    [provider]  atorvastatin (LIPITOR) 40 MG tablet Take 1 tablet (40 mg total) by mouth daily at 6 PM. 10/08/19   Ria Bush, MD  chlorthalidone (HYGROTON) 25 MG tablet TAKE 1 TABLET(25 MG) BY MOUTH DAILY Patient taking differently: Take 25 mg by mouth daily.  10/08/19   Ria Bush, MD  gabapentin (NEURONTIN) 100 MG capsule Take by mouth. 03/09/20   [provider]  ibuprofen (ADVIL)  600 MG tablet Take 1 tablet (600 mg total) by mouth every 6 (six) hours as needed. 07/12/20   Arta Silence, MD  Multiple Vitamin (MULTIVITAMIN WITH MINERALS) TABS tablet Take 1 tablet by mouth daily.    [provider]  omeprazole (PRILOSEC) 20 MG capsule Take 1 capsule (20 mg total) by mouth daily. 10/08/19   Ria Bush, MD  traMADol (ULTRAM) 50 MG tablet Take 1 tablet (50 mg total) by mouth every 6 (six) hours as  needed for up to 5 days. 07/12/20 07/17/20  Arta Silence, MD    Allergies Patient has no known allergies.  Family History  Problem Relation Age of Onset  . CAD Father 34       MI  . Hyperlipidemia Father   . Diabetes Paternal Grandfather   . Cancer Mother 47       deceased from bone cancer  . Stroke Neg Hx     Social History Social History   Tobacco Use  . Smoking status: Never Smoker  . Smokeless tobacco: Current User    Types: Chew  Vaping Use  . Vaping Use: Never used  Substance Use Topics  . Alcohol use: Yes    Alcohol/week: 0.0 standard drinks    Comment: 8 oz whiskey a night  . Drug use: No    Review of Systems  Constitutional: No fever/chills Eyes: No visual changes. ENT: No sore throat. Cardiovascular: Denies chest pain. Respiratory: Denies shortness of breath. Gastrointestinal: No nausea, no vomiting.  No diarrhea.  Genitourinary: Negative for dysuria.  Musculoskeletal: Negative for back pain. Skin: Negative for rash. Neurological: Negative for headaches, focal weakness or numbness.   ____________________________________________   PHYSICAL EXAM:  VITAL SIGNS: ED Triage Vitals  Enc Vitals Group     BP 07/12/20 0958 (!) 158/95     Pulse Rate 07/12/20 0958 93     Resp 07/12/20 0958 18     Temp 07/12/20 0958 98 F (36.7 C)     Temp Source 07/12/20 0958 Oral     SpO2 07/12/20 0958 99 %     Weight 07/12/20 0955 250 lb (113.4 kg)     Height 07/12/20 0955 5\' 8"  (1.727 m)     Head Circumference --      Peak Flow --      Pain Score 07/12/20 0954 10     Pain Loc --      Pain Edu? --      Excl. in [place]? --     Constitutional: Alert and oriented. Well appearing and in no acute distress. Eyes: Conjunctivae are normal.  Head: Atraumatic. Nose: No congestion/rhinnorhea. Mouth/Throat: Mucous membranes are moist.   Neck: Normal range of motion.  Cardiovascular: Normal rate, regular rhythm. Good peripheral circulation. Respiratory: Normal  respiratory effort.  No retractions.  Gastrointestinal: Soft and nontender. No distention.  Genitourinary: Minimal right flank tenderness. No inguinal lymphadenopathy or palpable mass. Mild right inguinal tenderness. Musculoskeletal: No lower extremity edema.  Extremities warm and well perfused.  Neurologic:  Normal speech and language. No gross focal neurologic deficits are appreciated.  Skin:  Skin is warm and dry. No rash noted. Psychiatric: Mood and affect are normal. Speech and behavior are normal.  ____________________________________________   LABS (all labs ordered are listed, but only abnormal results are displayed)  Labs Reviewed  COMPREHENSIVE METABOLIC PANEL - Abnormal; Notable for the following components:      Result Value   Potassium 3.4 (*)    Chloride 96 (*)  Glucose, Bld 136 (*)    All other components within normal limits  URINALYSIS, COMPLETE (UACMP) WITH MICROSCOPIC - Abnormal; Notable for the following components:   Color, Urine YELLOW (*)    APPearance CLEAR (*)    All other components within normal limits  LIPASE, BLOOD  CBC   ____________________________________________  EKG   ____________________________________________  RADIOLOGY  CT abdomen/pelvis: No acute abnormality. Normal appendix.  ____________________________________________   PROCEDURES  Procedure(s) performed: No  Procedures  Critical Care performed: No ____________________________________________   INITIAL IMPRESSION / ASSESSMENT AND PLAN / ED COURSE  Pertinent labs & imaging results that were available during my care of the patient were reviewed by me and considered in my medical decision making (see chart for details).  59 year old male with PMH as noted above presents with right lower quadrant pain over the last 2 weeks, acutely worsened over the last 1 to 2 days.  He has no significant associated symptoms.  He has not had any prior abdominal surgeries.  On exam, the  patient is overall well-appearing.  His vital signs are normal except for hypertension which is likely due to discomfort.  The abdomen is soft with no focal tenderness.  There does not seem to be any reproducible tenderness to the area of the pain although he has mild discomfort in the right flank/inguinal area. There is no lymphadenopathy or palpable mass.  Overall presentation is most consistent with ureteral stone versus possible musculoskeletal pain.  Differential also includes appendicitis, colitis, diverticulitis, pyelonephritis. There is no evidence of vascular etiology. Will obtain labs, urinalysis, and a CT.  ----------------------------------------- 12:49 PM on 07/12/2020 -----------------------------------------  CT shows no acute abnormality. The lab work-up and urinalysis are unremarkable. Given this negative work-up, I strongly suspect musculoskeletal pain. I counseled the patient on the results of the work-up. At this time, he is stable for discharge home. He feels comfortable and has not required any pain medication in the ED. I gave him thorough return precautions and he expressed understanding. ____________________________________________   FINAL CLINICAL IMPRESSION(S) / ED DIAGNOSES  Final diagnoses:  Right inguinal pain      NEW MEDICATIONS STARTED DURING THIS VISIT:  New Prescriptions   IBUPROFEN (ADVIL) 600 MG TABLET    Take 1 tablet (600 mg total) by mouth every 6 (six) hours as needed.   TRAMADOL (ULTRAM) 50 MG TABLET    Take 1 tablet (50 mg total) by mouth every 6 (six) hours as needed for up to 5 days.     Note:  This document was prepared using Dragon voice recognition software and may include unintentional dictation errors.        Arta Silence, MD 07/12/20 1251

## 2020-07-12 NOTE — ED Triage Notes (Signed)
Pt arrived to ed via pov, c/o sever rt lower abd pain. Initially started 2 weeks ago but has increased in severity today. Pt denies any N/V/D. Pt verbalized concerns of appendix rupture. Pain currently 10/10

## 2020-07-19 ENCOUNTER — Other Ambulatory Visit: Payer: Self-pay

## 2020-07-19 ENCOUNTER — Ambulatory Visit (INDEPENDENT_AMBULATORY_CARE_PROVIDER_SITE_OTHER): Payer: 59 | Admitting: Family Medicine

## 2020-07-19 ENCOUNTER — Encounter: Payer: Self-pay | Admitting: Family Medicine

## 2020-07-19 ENCOUNTER — Ambulatory Visit: Payer: 59 | Admitting: Family Medicine

## 2020-07-19 VITALS — BP 140/86 | HR 77 | Temp 98.2°F | Ht 68.0 in | Wt 252.5 lb

## 2020-07-19 DIAGNOSIS — R1031 Right lower quadrant pain: Secondary | ICD-10-CM

## 2020-07-19 DIAGNOSIS — N451 Epididymitis: Secondary | ICD-10-CM

## 2020-07-19 MED ORDER — LEVOFLOXACIN 500 MG PO TABS: 500.0000 mg | ORAL_TABLET | Freq: Every day | ORAL | 0 refills | Status: AC

## 2020-07-19 NOTE — Progress Notes (Signed)
Jannah Guardiola T. Samadhi Mahurin, MD, [place]  [facility] and [place] at [facility] [place], [address]  Phone: [phone]  FAX: [place] - 59 y.o. male  MRN [id number]  Date of Birth: 1960/10/21  Date: 07/19/2020  PCP: Ria Bush, MD  Referral: Ria Bush, MD  Chief Complaint  Patient presents with  . LRQ Pain    Seen in ER last week    This visit occurred during the SARS-CoV-2 public health emergency.  Safety protocols were in place, including screening questions prior to the visit, additional usage of staff PPE, and extensive cleaning of exam room while observing appropriate contact time as indicated for disinfecting solutions.   Subjective:   [name] [name] is a 59 y.o. very pleasant male patient with Body mass index is 38.39 kg/m. who presents with the following:  R LQ abdominal pain:  CT 07/12/2020, no appy, o/w normal He was having some significant abdominal pain, he went to the ER about 1 week ago.  They did a excellent work-up with normal CMP as well as lipase.  He also did a urinalysis with micro, and this was also negative.  I did review his CT results, and they were grossly unremarkable with no appendicitis, but did show some diverticulosis.  Otherwise it was unremarkable.   Right now, he is having some difficulties walking, and certain positions will hurt quite a bit.  Standing up from a seated position will also hurt.  It was really difficult to walk. ertain positions will hurt.  Standing up will hurt.  R small hernia.  epidyd  testical pain:    Review of Systems is noted in the HPI, as appropriate  Objective:   BP 140/86   Pulse 77   Temp 98.2 F (36.8 C) (Temporal)   Ht 5\' 8"  (1.727 m)   Wt 252 lb 8 oz (114.5 kg)   SpO2 98%   BMI 38.39 kg/m   GEN: No acute distress; alert,appropriate. PULM: Breathing comfortably in no respiratory distress PSYCH:  Normally interactive.   Right GU exam: Normal male, on the right testicle and epididymis, posterior aspect does have some tenderness to palpation compared to the left. On check for inguinal hernia the patient does have what I think is a subtle bulge on exam.  I do not feel this on the left.  Laboratory and Imaging Data: Recent Results (from the past 2160 hour(s))  Lipase, blood     Status: None   Collection Time: 07/12/20 10:18 AM  Result Value Ref Range   Lipase 21 11 - 51 U/L    Comment: Performed at [facility], [address] [place], [place] [address]  Comprehensive metabolic panel     Status: Abnormal   Collection Time: 07/12/20 10:18 AM  Result Value Ref Range   Sodium 136 135 - 145 mmol/L   Potassium 3.4 (L) 3.5 - 5.1 mmol/L   Chloride 96 (L) 98 - 111 mmol/L   CO2 28 22 - 32 mmol/L   Glucose, Bld 136 (H) 70 - 99 mg/dL    Comment: Glucose reference range applies only to samples taken after fasting for at least 8 hours.   BUN 13 6 - 20 mg/dL   Creatinine, Ser 0.89 0.61 - 1.24 mg/dL   Calcium 9.6 8.9 - 10.3 mg/dL   Total Protein 7.4 6.5 - 8.1 g/dL   Albumin 4.3 3.5 - 5.0 g/dL  AST 28 15 - 41 U/L   ALT 39 0 - 44 U/L   Alkaline Phosphatase 98 38 - 126 U/L   Total Bilirubin 0.8 0.3 - 1.2 mg/dL   GFR, Estimated >60 >60 mL/min    Comment: (NOTE) Calculated using the CKD-EPI Creatinine Equation (2021)    Anion gap 12 5 - 15    Comment: Performed at [facility], [address] [place], [place] [address]  CBC     Status: None   Collection Time: 07/12/20 10:18 AM  Result Value Ref Range   WBC 6.7 4.0 - 10.5 K/uL   RBC 5.05 4.22 - 5.81 MIL/uL   Hemoglobin 16.2 13.0 - 17.0 g/dL   HCT 46.2 39 - 52 %   MCV 91.5 80.0 - 100.0 fL   MCH 32.1 26.0 - 34.0 pg   MCHC 35.1 30.0 - 36.0 g/dL   RDW 12.5 11.5 - 15.5 %   Platelets 253 150 - 400 K/uL   nRBC 0.0 0.0 - 0.2 %    Comment: Performed at [facility], [address] [place], [place] [address]   Urinalysis, Complete w Microscopic Urine, Clean Catch     Status: Abnormal   Collection Time: 07/12/20 10:19 AM  Result Value Ref Range   Color, Urine YELLOW (A) YELLOW   APPearance CLEAR (A) CLEAR   Specific Gravity, Urine 1.011 1.005 - 1.030   pH 6.0 5.0 - 8.0   Glucose, UA NEGATIVE NEGATIVE mg/dL   Hgb urine dipstick NEGATIVE NEGATIVE   Bilirubin Urine NEGATIVE NEGATIVE   Ketones, ur NEGATIVE NEGATIVE mg/dL   Protein, ur NEGATIVE NEGATIVE mg/dL   Nitrite NEGATIVE NEGATIVE   Leukocytes,Ua NEGATIVE NEGATIVE   WBC, UA 0-5 0 - 5 WBC/hpf   Bacteria, UA NONE SEEN NONE SEEN   Squamous Epithelial / LPF 0-5 0 - 5   Mucus PRESENT     Comment: Performed at [facility], [address] [place], [place] [address]     Assessment and Plan:     ICD-10-CM   1. Right lower quadrant abdominal pain  R10.31   2. Epididymitis, right  N45.1    Right lower quadrant abdominal pain.  Normal CT.  In this clinical setting, hernia is certainly possible, I am not 100% sure.  He does certainly have signs and symptoms of epididymitis, so I am going to start antibiotics.  If he still is having difficulties and pain after his 10-day course of Levaquin, and he is going to call in.  At that point following up with surgery would be appropriate.  Meds ordered this encounter  Medications  . levofloxacin (LEVAQUIN) 500 MG tablet    Sig: Take 1 tablet (500 mg total) by mouth daily for 10 days.    Dispense:  10 tablet    Refill:  0   There are no discontinued medications. No orders of the defined types were placed in this encounter.   Follow-up: No follow-ups on file.  Signed,  Maud Deed. Michalina Calbert, MD   Outpatient Encounter Medications as of 07/19/2020  Medication Sig  . aspirin EC 325 MG tablet Take 650 mg by mouth daily as needed for moderate pain.  [name] atorvastatin (LIPITOR) 40 MG tablet Take 1 tablet (40 mg total) by mouth daily at 6 PM.  . chlorthalidone (HYGROTON) 25 MG tablet TAKE 1  TABLET(25 MG) BY MOUTH DAILY  . gabapentin (NEURONTIN) 100 MG capsule Take by mouth.  [name] ibuprofen (ADVIL) 600 MG tablet Take  1 tablet (600 mg total) by mouth every 6 (six) hours as needed.  . Multiple Vitamin (MULTIVITAMIN WITH MINERALS) TABS tablet Take 1 tablet by mouth daily.  [name] omeprazole (PRILOSEC) 20 MG capsule Take 1 capsule (20 mg total) by mouth daily.  [name] levofloxacin (LEVAQUIN) 500 MG tablet Take 1 tablet (500 mg total) by mouth daily for 10 days.   No facility-administered encounter medications on file as of 07/19/2020.

## 2020-08-02 ENCOUNTER — Encounter: Payer: Self-pay | Admitting: Family Medicine

## 2020-08-02 ENCOUNTER — Ambulatory Visit (INDEPENDENT_AMBULATORY_CARE_PROVIDER_SITE_OTHER): Payer: 59 | Admitting: Family Medicine

## 2020-08-02 ENCOUNTER — Other Ambulatory Visit: Payer: Self-pay

## 2020-08-02 ENCOUNTER — Ambulatory Visit (INDEPENDENT_AMBULATORY_CARE_PROVIDER_SITE_OTHER)
Admission: RE | Admit: 2020-08-02 | Discharge: 2020-08-02 | Disposition: A | Discharge status: Home / Self Care | Payer: 59 | Source: Ambulatory Visit | Attending: Family Medicine | Admitting: Family Medicine

## 2020-08-02 VITALS — BP 120/80 | HR 84 | Temp 98.1°F | Ht 68.0 in | Wt 253.2 lb

## 2020-08-02 DIAGNOSIS — G8929 Other chronic pain: Secondary | ICD-10-CM | POA: Insufficient documentation

## 2020-08-02 DIAGNOSIS — R1031 Right lower quadrant pain: Secondary | ICD-10-CM | POA: Diagnosis not present

## 2020-08-02 NOTE — Assessment & Plan Note (Addendum)
Reassuring ER evaluation reviewed including CT abd/pelvis.  No obvious groin bulge/hernia on exam today. He does have reproducible R hip pain - check films today. Pending results discussed ortho vs gen surgery eval.  In interim, take tylenol for discomfort.

## 2020-08-02 NOTE — Progress Notes (Signed)
Patient ID: [name] [name], male    DOB: 1961/01/03, 59 y.o.   MRN: [id number]  This visit was conducted in person.  BP 120/80 (BP Location: Left Arm, Patient Position: Sitting, Cuff Size: Large)   Pulse 84   Temp 98.1 F (36.7 C) (Temporal)   Ht 5\' 8"  (1.727 m)   Wt 253 lb 3 oz (114.8 kg)   SpO2 98%   BMI 38.50 kg/m    CC: R groin pain  Subjective:   HPI: [name] [name] is a 59 y.o. male presenting on 08/02/2020 for Follow-up (Here for right groin pain.  Seen recently by Dr. Lorelei Pont.  Wants to discuss referral for surgeon. )   Seen at ER then saw Dr Copland 2 wks ago with RLQ abd pain.  CT from ER 07/12/2020 - no appendicitis, otherwise normal. Labs also unremarkable.  Dr Lorelei Pont felt he had subtle R inguinal hernia - as well as possible epididymitis so treated with levaquin 500mg  10d course. No benefit with this.   Ongoing RLQ abd pain without radiation, worse with standing. Doesn't notice any bulge.  Denies inciting trauma/injury or falls.  No fevers/chills, nausea/vomiting, diarrhea/constipation, blood in the stool or urine, dysuria, urgency, frequency.   L temporal headache - normal temporal artery - managing with gabapentin 100mg  nightly with benefit.      Relevant past medical, surgical, family and social history reviewed and updated as indicated. Interim medical history since our last visit reviewed. Allergies and medications reviewed and updated. Outpatient Medications Prior to Visit  Medication Sig Dispense Refill  . aspirin EC 325 MG tablet Take 650 mg by mouth daily as needed for moderate pain.    [name] atorvastatin (LIPITOR) 40 MG tablet Take 1 tablet (40 mg total) by mouth daily at 6 PM. 90 tablet 3  . chlorthalidone (HYGROTON) 25 MG tablet TAKE 1 TABLET(25 MG) BY MOUTH DAILY 90 tablet 3  . gabapentin (NEURONTIN) 100 MG capsule Take by mouth.    . Multiple Vitamin (MULTIVITAMIN WITH MINERALS) TABS tablet Take 1 tablet by mouth daily.    [name] omeprazole (PRILOSEC) 20 MG  capsule Take 1 capsule (20 mg total) by mouth daily. 90 capsule 3  . ibuprofen (ADVIL) 600 MG tablet Take 1 tablet (600 mg total) by mouth every 6 (six) hours as needed. 30 tablet 0   No facility-administered medications prior to visit.     Per HPI unless specifically indicated in ROS section below Review of Systems Objective:  BP 120/80 (BP Location: Left Arm, Patient Position: Sitting, Cuff Size: Large)   Pulse 84   Temp 98.1 F (36.7 C) (Temporal)   Ht 5\' 8"  (1.727 m)   Wt 253 lb 3 oz (114.8 kg)   SpO2 98%   BMI 38.50 kg/m   Wt Readings from Last 3 Encounters:  08/02/20 253 lb 3 oz (114.8 kg)  07/19/20 252 lb 8 oz (114.5 kg)  07/12/20 250 lb (113.4 kg)      Physical Exam Vitals and nursing note reviewed.  Constitutional:      Appearance: Normal appearance. He is not ill-appearing.  Eyes:     Extraocular Movements: Extraocular movements intact.     Pupils: Pupils are equal, round, and reactive to light.  Cardiovascular:     Rate and Rhythm: Normal rate and regular rhythm.     Pulses: Normal pulses.     Heart sounds: Normal heart sounds. No murmur heard.   Pulmonary:     Effort: Pulmonary effort  is normal. No respiratory distress.     Breath sounds: Normal breath sounds. No wheezing, rhonchi or rales.  Abdominal:     General: Bowel sounds are normal. There is no distension.     Palpations: Abdomen is soft. There is no mass.     Tenderness: There is no abdominal tenderness. There is no right CVA tenderness, left CVA tenderness, guarding or rebound.     Hernia: No hernia is present. There is no hernia in the umbilical area, left inguinal area or right inguinal area.       Comments: Mild discomfort to palpation lower RLQ into groin without obvious bulge in groin  Musculoskeletal:        General: Normal range of motion.     Right lower leg: No edema.     Left lower leg: No edema.     Comments:  Neg SLR bilaterally Discomfort (4-5/10) with int/ext rotation at R  hip. Reproducible discomfort with testing hip flexors against resistance  Skin:    General: Skin is warm and dry.     Findings: No rash.  Neurological:     Mental Status: He is alert.  Psychiatric:        Mood and Affect: Mood normal.        Behavior: Behavior normal.       Results for orders placed or performed during the hospital encounter of 07/12/20  Lipase, blood  Result Value Ref Range   Lipase 21 11 - 51 U/L  Comprehensive metabolic panel  Result Value Ref Range   Sodium 136 135 - 145 mmol/L   Potassium 3.4 (L) 3.5 - 5.1 mmol/L   Chloride 96 (L) 98 - 111 mmol/L   CO2 28 22 - 32 mmol/L   Glucose, Bld 136 (H) 70 - 99 mg/dL   BUN 13 6 - 20 mg/dL   Creatinine, Ser 0.89 0.61 - 1.24 mg/dL   Calcium 9.6 8.9 - 10.3 mg/dL   Total Protein 7.4 6.5 - 8.1 g/dL   Albumin 4.3 3.5 - 5.0 g/dL   AST 28 15 - 41 U/L   ALT 39 0 - 44 U/L   Alkaline Phosphatase 98 38 - 126 U/L   Total Bilirubin 0.8 0.3 - 1.2 mg/dL   GFR, Estimated >60 >60 mL/min   Anion gap 12 5 - 15  CBC  Result Value Ref Range   WBC 6.7 4.0 - 10.5 K/uL   RBC 5.05 4.22 - 5.81 MIL/uL   Hemoglobin 16.2 13.0 - 17.0 g/dL   HCT 46.2 39 - 52 %   MCV 91.5 80.0 - 100.0 fL   MCH 32.1 26.0 - 34.0 pg   MCHC 35.1 30.0 - 36.0 g/dL   RDW 12.5 11.5 - 15.5 %   Platelets 253 150 - 400 K/uL   nRBC 0.0 0.0 - 0.2 %  Urinalysis, Complete w Microscopic Urine, Clean Catch  Result Value Ref Range   Color, Urine YELLOW (A) YELLOW   APPearance CLEAR (A) CLEAR   Specific Gravity, Urine 1.011 1.005 - 1.030   pH 6.0 5.0 - 8.0   Glucose, UA NEGATIVE NEGATIVE mg/dL   Hgb urine dipstick NEGATIVE NEGATIVE   Bilirubin Urine NEGATIVE NEGATIVE   Ketones, ur NEGATIVE NEGATIVE mg/dL   Protein, ur NEGATIVE NEGATIVE mg/dL   Nitrite NEGATIVE NEGATIVE   Leukocytes,Ua NEGATIVE NEGATIVE   WBC, UA 0-5 0 - 5 WBC/hpf   Bacteria, UA NONE SEEN NONE SEEN   Squamous Epithelial / LPF 0-5 0 -  5   Mucus PRESENT    Assessment & Plan:  This visit  occurred during the SARS-CoV-2 public health emergency.  Safety protocols were in place, including screening questions prior to the visit, additional usage of staff PPE, and extensive cleaning of exam room while observing appropriate contact time as indicated for disinfecting solutions.   Problem List Items Addressed This Visit    Right groin pain - Primary    Reassuring ER evaluation reviewed including CT abd/pelvis.  No obvious groin bulge/hernia on exam today. He does have reproducible R hip pain - check films today. Pending results discussed ortho vs gen surgery eval.  In interim, take tylenol for discomfort.       Relevant Orders   DG Hip Unilat W OR W/O Pelvis 2-3 Views Right       No orders of the defined types were placed in this encounter.  Orders Placed This Encounter  Procedures  . DG Hip Unilat W OR W/O Pelvis 2-3 Views Right    Standing Status:   Future    Number of Occurrences:   1    Standing Expiration Date:   08/02/2021    Order Specific Question:   Reason for Exam (SYMPTOM  OR DIAGNOSIS REQUIRED)    Answer:   right hip pain at groin    Order Specific Question:   Preferred imaging location?    Answer:   [facility]    Patient Instructions  Pick up potassium 99mg  over the counter to take daily while you're on chlorthalidone.  For R sided pain - check xray today. Pending results of xray we may send you to orthopedist or general surgeon for evaluation. In the meantime, may take tylenol 500-1000mg  with meals for discomfort    Follow up plan: No follow-ups on file.  Ria Bush, MD

## 2020-08-02 NOTE — Patient Instructions (Addendum)
Pick up potassium 99mg  over the counter to take daily while you're on chlorthalidone.  For R sided pain - check xray today. Pending results of xray we may send you to orthopedist or general surgeon for evaluation. In the meantime, may take tylenol 500-1000mg  with meals for discomfort

## 2020-08-03 ENCOUNTER — Other Ambulatory Visit: Payer: Self-pay | Admitting: Family Medicine

## 2020-08-03 DIAGNOSIS — R1031 Right lower quadrant pain: Secondary | ICD-10-CM

## 2020-08-08 ENCOUNTER — Ambulatory Visit (INDEPENDENT_AMBULATORY_CARE_PROVIDER_SITE_OTHER): Payer: 59 | Admitting: General Surgery

## 2020-08-08 ENCOUNTER — Encounter: Payer: Self-pay | Admitting: General Surgery

## 2020-08-08 ENCOUNTER — Other Ambulatory Visit: Payer: Self-pay

## 2020-08-08 VITALS — BP 170/95 | HR 87 | Temp 97.8°F | Ht 68.0 in | Wt 254.6 lb

## 2020-08-08 DIAGNOSIS — R1031 Right lower quadrant pain: Secondary | ICD-10-CM | POA: Diagnosis not present

## 2020-08-08 NOTE — Progress Notes (Signed)
Patient ID: [name] [name], male   DOB: 1961-05-11, 59 y.o.   MRN: [id number]  Chief Complaint  Patient presents with   Groin Pain    HPI [name] [name] is a 59 y.o. male.  He has been referred by his primary care provider, Dr. Ria Bush, for further evaluation of right lower quadrant/right groin pain.  [name] [name] states that roughly a month or so ago, he developed right lower quadrant pain that was severe enough to send him to the emergency room, concern for appendicitis.  A CT scan was performed that was negative for intra-abdominal pathology, specifically the appendix was normal.  At that time, there was no associated nausea or vomiting, nor fevers or chills.  No alterations in bowel function.  No urinary symptoms.  The pain did not radiate.  He followed up in the family medicine clinic by Dr. Edilia Bo and was presumptively diagnosed with epididymitis.  Dr. Arlyn Dunning note indicates that [name] [name] had worse pain with standing and walking.  Dr. Edilia Bo also thought he could possibly feel a slight bulge in the right inguinal area.  [name] [name] was prescribed a 10-day course of Levaquin, after which he followed up with Dr. Danise Mina.  Dr. Bosie [name] exam, no bulge was appreciated and there was consideration of the possibility that this could be orthopedic in nature.  Plain films taken of the right hip showed mild degenerative change without any other significant findings.  [name] [name] has been referred for further evaluation and management of right hip/groin pain.  He reports that over the past week or so, the pain that was previously 10 out of 10 has decreased to 4 out of 10.  He denies any history of injury in the area.  He continues to deny any alterations in bowel function or difficulty with voiding.  The only thing he can think of that seems to improve his symptoms is to lie down.  He denies any factors that seem to make it worse.     Past Medical History:  Diagnosis Date   Alcohol use    regularly 3  beers/day   CAD (coronary artery disease)    mild   Dyslipidemia    GERD (gastroesophageal reflux disease)    with esophagitis   History of Helicobacter pylori infection    Hypertension, essential    Obesity, Class I, BMI 30-34.9 01/19/2016    Past Surgical History:  Procedure Laterality Date   ANTERIOR CERVICAL DECOMP/DISCECTOMY FUSION  2012   C4-5 ([place])   ANTERIOR CERVICAL DECOMP/DISCECTOMY FUSION N/A 03/12/2018   ACDF C4-6, EXPLORATION OF FUSION C6-7, REMOVAL OF HARDWARE;  [name], [name], MD)   ARTERY BIOPSY Left 10/20/2019   TEMPORAL ARTERY BIOPSY - Schnier, Dolores Lory, MD   CARDIAC CATHETERIZATION  2013   no significant CAD, nl LVEF 60% [name])   COLONOSCOPY  2012   WNL per PCP report   CT CTA CORONARY W/CA SCORE W/CM &/OR WO/CM  2015   minor luminal irregularities, Ca score = 90 [name])   KNEE ARTHROSCOPY Bilateral    ROTATOR CUFF REPAIR Right 2019   SCLEROTHERAPY     legs    Family History  Problem Relation Age of Onset   CAD Father 51       MI   Hyperlipidemia Father    Diabetes Paternal Grandfather    Cancer Mother 16       deceased from bone cancer   Stroke Neg Hx     Social History Social History  Tobacco Use   Smoking status: Never Smoker   Smokeless tobacco: Current User    Types: Chew  Vaping Use   Vaping Use: Never used  Substance Use Topics   Alcohol use: Yes    Alcohol/week: 0.0 standard drinks    Comment: 8 oz whiskey a night   Drug use: No    No Known Allergies  Current Outpatient Medications  Medication Sig Dispense Refill   aspirin EC 81 MG tablet Take 81 mg by mouth daily. Swallow whole.     atorvastatin (LIPITOR) 40 MG tablet Take 1 tablet (40 mg total) by mouth daily at 6 PM. 90 tablet 3   chlorthalidone (HYGROTON) 25 MG tablet TAKE 1 TABLET(25 MG) BY MOUTH DAILY 90 tablet 3   gabapentin (NEURONTIN) 100 MG capsule Take by mouth.     Multiple Vitamin (MULTIVITAMIN WITH MINERALS) TABS tablet Take 1  tablet by mouth daily.     omeprazole (PRILOSEC) 20 MG capsule Take 1 capsule (20 mg total) by mouth daily. 90 capsule 3   No current facility-administered medications for this visit.    Review of Systems Review of Systems  All other systems reviewed and are negative. Or as discussed in the history of present illness.  Blood pressure (!) 170/95, pulse 87, temperature 97.8 F (36.6 C), height 5\' 8"  (1.727 m), weight 254 lb 9.6 oz (115.5 kg), SpO2 96 %. Body mass index is 38.71 kg/m.  Physical Exam Physical Exam Vitals reviewed. Exam conducted with a chaperone present.  Constitutional:      General: He is not in acute distress.    Appearance: He is obese.  HENT:     Head: Normocephalic and atraumatic.     Nose:     Comments: Covered with a mask    Mouth/Throat:     Comments: Covered with a mask Eyes:     General: No scleral icterus.       Right eye: No discharge.        Left eye: No discharge.  Neck:     Comments: The trachea is midline.  There is no palpable cervical or supraclavicular lymphadenopathy.  No dominant thyroid masses or thyromegaly appreciated.  The gland moves freely on deglutition. Cardiovascular:     Rate and Rhythm: Normal rate and regular rhythm.     Pulses: Normal pulses.  Pulmonary:     Effort: Pulmonary effort is normal. No respiratory distress.     Breath sounds: Normal breath sounds.  Abdominal:     General: Bowel sounds are normal.     Palpations: Abdomen is soft.     Comments: Protuberant, consistent with his level of obesity.  Genitourinary:    Comments: I do not appreciate a hernia in either groin. Musculoskeletal:        General: No deformity or signs of injury.  Skin:    General: Skin is warm and dry.  Neurological:     General: No focal deficit present.     Mental Status: He is alert and oriented to person, place, and time.  Psychiatric:        Mood and Affect: Mood normal.        Behavior: Behavior normal.     Data Reviewed I  reviewed the electronic medical record, including the emergency department visit of July 12, 2020, the clinic visit with Dr. Edilia Bo on July 19, 2020, and the clinic visit with Dr. Danise Mina on August 02, 2020.  I also reviewed the imaging, both the CT  scan of the abdomen and pelvis as well as the plain films of the hip.  I concur with the radiology interpretations which I discussed in the history of present illness.  Results for [name] [name], [name] [name] (MRN [id number]) as of 08/08/2020 14:16  Ref. Range 07/12/2020 10:18 07/12/2020 10:19  COMPREHENSIVE METABOLIC PANEL Unknown Rpt (A)   Sodium Latest Ref Range: 135 - 145 mmol/L 136   Potassium Latest Ref Range: 3.5 - 5.1 mmol/L 3.4 (L)   Chloride Latest Ref Range: 98 - 111 mmol/L 96 (L)   CO2 Latest Ref Range: 22 - 32 mmol/L 28   Glucose Latest Ref Range: 70 - 99 mg/dL 136 (H)   BUN Latest Ref Range: 6 - 20 mg/dL 13   Creatinine Latest Ref Range: 0.61 - 1.24 mg/dL 0.89   Calcium Latest Ref Range: 8.9 - 10.3 mg/dL 9.6   Anion gap Latest Ref Range: 5 - 15  12   Alkaline Phosphatase Latest Ref Range: 38 - 126 U/L 98   Albumin Latest Ref Range: 3.5 - 5.0 g/dL 4.3   Lipase Latest Ref Range: 11 - 51 U/L 21   AST Latest Ref Range: 15 - 41 U/L 28   ALT Latest Ref Range: 0 - 44 U/L 39   Total Protein Latest Ref Range: 6.5 - 8.1 g/dL 7.4   Total Bilirubin Latest Ref Range: 0.3 - 1.2 mg/dL 0.8   GFR, Estimated Latest Ref Range: >60 mL/min >60   WBC Latest Ref Range: 4.0 - 10.5 K/uL 6.7   RBC Latest Ref Range: 4.22 - 5.81 MIL/uL 5.05   Hemoglobin Latest Ref Range: 13.0 - 17.0 g/dL 16.2   HCT Latest Ref Range: 39.0 - 52.0 % 46.2   MCV Latest Ref Range: 80.0 - 100.0 fL 91.5   MCH Latest Ref Range: 26.0 - 34.0 pg 32.1   MCHC Latest Ref Range: 30.0 - 36.0 g/dL 35.1   RDW Latest Ref Range: 11.5 - 15.5 % 12.5   Platelets Latest Ref Range: 150 - 400 K/uL 253   nRBC Latest Ref Range: 0.0 - 0.2 % 0.0   Appearance Latest Ref Range: CLEAR   CLEAR (A)   Bilirubin Urine Latest Ref Range: NEGATIVE   NEGATIVE  Color, Urine Latest Ref Range: YELLOW   YELLOW (A)  Glucose, UA Latest Ref Range: NEGATIVE mg/dL  NEGATIVE  Hgb urine dipstick Latest Ref Range: NEGATIVE   NEGATIVE  Ketones, ur Latest Ref Range: NEGATIVE mg/dL  NEGATIVE  Leukocytes,Ua Latest Ref Range: NEGATIVE   NEGATIVE  Nitrite Latest Ref Range: NEGATIVE   NEGATIVE  pH Latest Ref Range: 5.0 - 8.0   6.0  Protein Latest Ref Range: NEGATIVE mg/dL  NEGATIVE  Specific Gravity, Urine Latest Ref Range: 1.005 - 1.030   1.011  These labs were obtained during his emergency room visit.  There are no clinically significant abnormalities identified and nothing that would explain his symptoms.  Assessment This is a 58 year old man with a roughly 1 month history of right groin pain/right lower quadrant pain.  Physical examination does not suggest a hernia and the CT scan performed in the ED was negative for such.  Plain films of the hip were obtained but no specific soft tissue imaging, such as MRI has yet been performed.  Plan I have ordered an ultrasound of the groin area with provoked maneuvers to see if a bulge/hernia can be elicited.  I suspect his discomfort is due to other causes however.  Once I have the results  of the imaging, I will contact the patient and discuss whether or not surgery is warranted on my end.  If it is negative, I think further orthopedic evaluation would be reasonable.  If he does have a hernia, I am certainly willing to operate and repair that and will discuss that option with him.  Follow-up will be arranged depending on the sonographic findings.    Fredirick Maudlin 08/08/2020, 2:02 PM

## 2020-08-08 NOTE — Patient Instructions (Addendum)
We will get you set up for an ultrasound to better assess the area.   You are scheduled for an ultrasound at [facility] on Friday December 17th at 11:00 am. You will need to arrive at the [facility] at 10:45 am and report to the Registration desk.   Dr Celine Ahr will call you with the results and will let know the next steps.

## 2020-08-11 ENCOUNTER — Other Ambulatory Visit: Payer: Self-pay

## 2020-08-11 ENCOUNTER — Ambulatory Visit
Admission: RE | Admit: 2020-08-11 | Discharge: 2020-08-11 | Disposition: A | Discharge status: Home / Self Care | Payer: 59 | Source: Ambulatory Visit | Attending: General Surgery | Admitting: General Surgery

## 2020-08-11 DIAGNOSIS — R1031 Right lower quadrant pain: Secondary | ICD-10-CM | POA: Diagnosis not present

## 2020-08-22 ENCOUNTER — Telehealth: Payer: Self-pay | Admitting: General Surgery

## 2020-08-22 NOTE — Telephone Encounter (Signed)
Discussed results of ultrasound with the patient.  No hernia seen.  I also forwarded the results to his primary care provider so that he can determine the next steps in [name] [name] care.

## 2020-08-23 ENCOUNTER — Other Ambulatory Visit: Payer: Self-pay | Admitting: Family Medicine

## 2020-08-23 DIAGNOSIS — R1031 Right lower quadrant pain: Secondary | ICD-10-CM

## 2020-10-01 ENCOUNTER — Other Ambulatory Visit: Payer: Self-pay | Admitting: Family Medicine

## 2020-10-02 NOTE — Telephone Encounter (Signed)
Pharmacy requests refill on: Chlorthalidone 25 mg, Atorvastatin 40 mg & Omeprazole 20 mg   LAST REFILL: 10/08/2019 (Q-90, R-3) LAST OV: 08/02/2020 NEXT OV: Not Scheduled  PHARMACY: [facility] [place], [place]

## 2021-03-30 ENCOUNTER — Telehealth: Payer: Self-pay

## 2021-03-30 MED ORDER — ATORVASTATIN CALCIUM 40 MG PO TABS: ORAL_TABLET | ORAL | 0 refills | Status: DC

## 2021-03-30 MED ORDER — OMEPRAZOLE 20 MG PO CPDR: DELAYED_RELEASE_CAPSULE | ORAL | 0 refills | Status: DC

## 2021-03-30 MED ORDER — CHLORTHALIDONE 25 MG PO TABS: 25.0000 mg | ORAL_TABLET | Freq: Every day | ORAL | 0 refills | Status: DC

## 2021-03-30 NOTE — Telephone Encounter (Signed)
E-scribed refills.  Plz schedule lab and cpe visits.

## 2021-03-30 NOTE — Telephone Encounter (Signed)
Called pt to schedule appt. Pt stated to call him on Monday to schedule because he was busy at the moment.

## 2021-05-25 IMAGING — US US EXTREM LOW*R* LIMITED
1 series · 14 of 14 positions shown · non-contrast
Comparison: None.

CLINICAL DATA: Right groin pain.

EXAM:
ULTRASOUND RIGHT LOWER EXTREMITY LIMITED
TECHNIQUE: Ultrasound examination of the lower extremity soft tissues was
performed in the area of clinical concern.

[Series 1: us soft tissue lower extremity limited right (non- · 14 acquisitions, 14 frames shown]
[im 1/14]
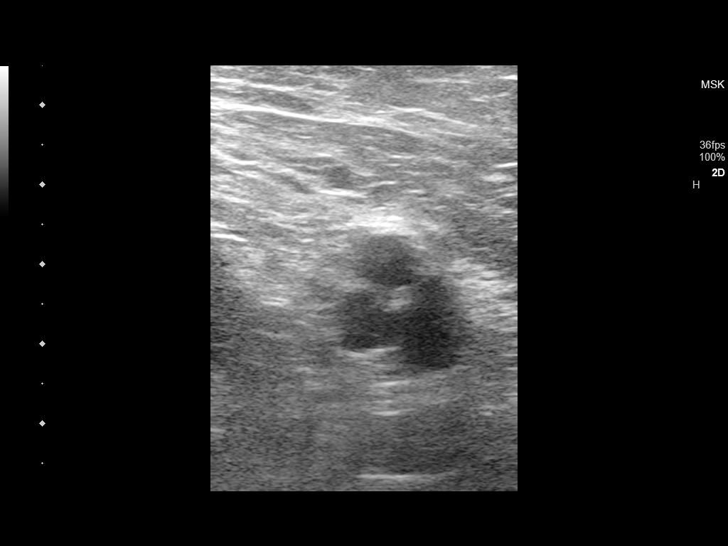
[im 2/14]
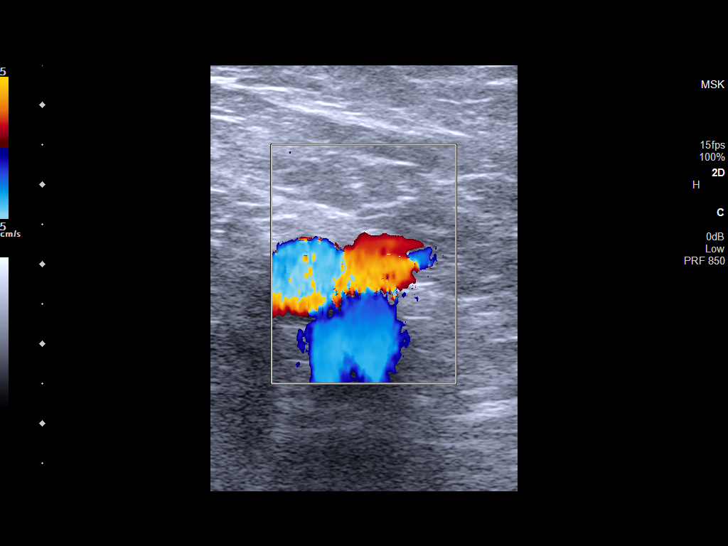
[im 3/14]
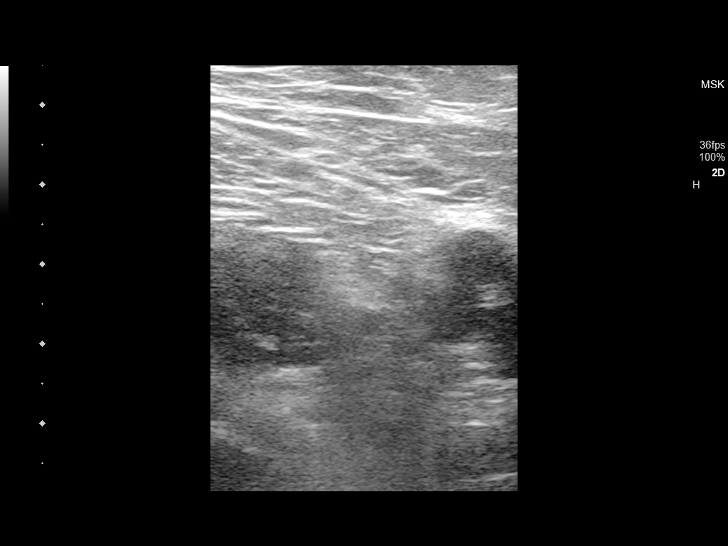
[im 4/14]
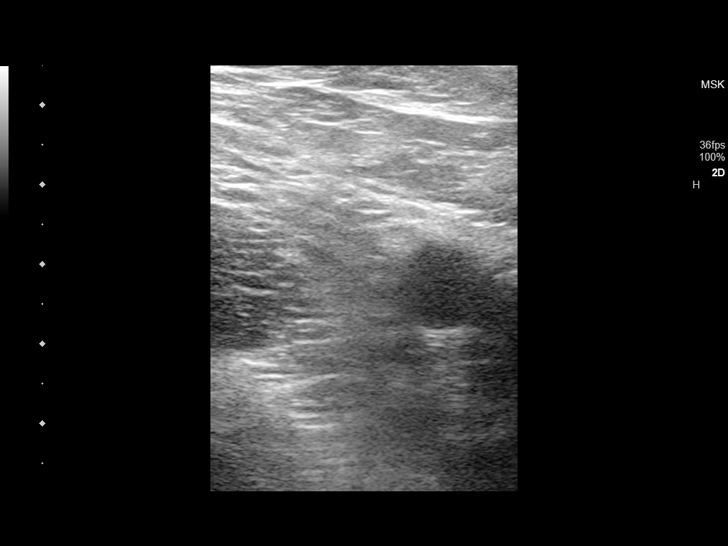
[im 5/14]
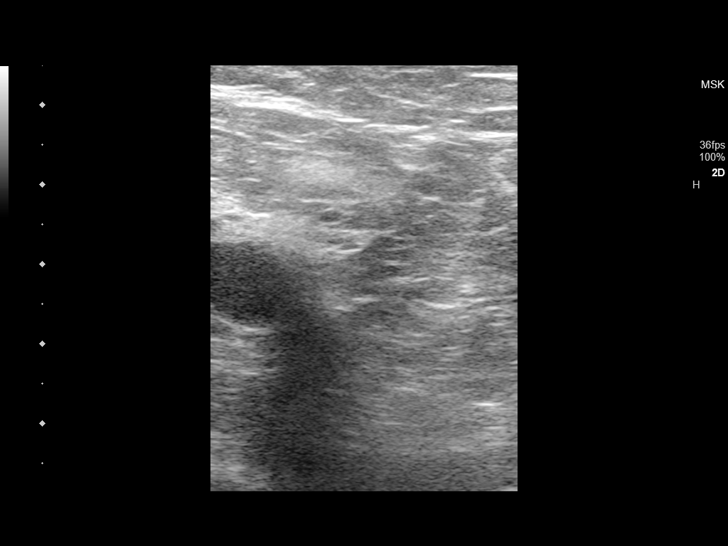
[im 6/14]
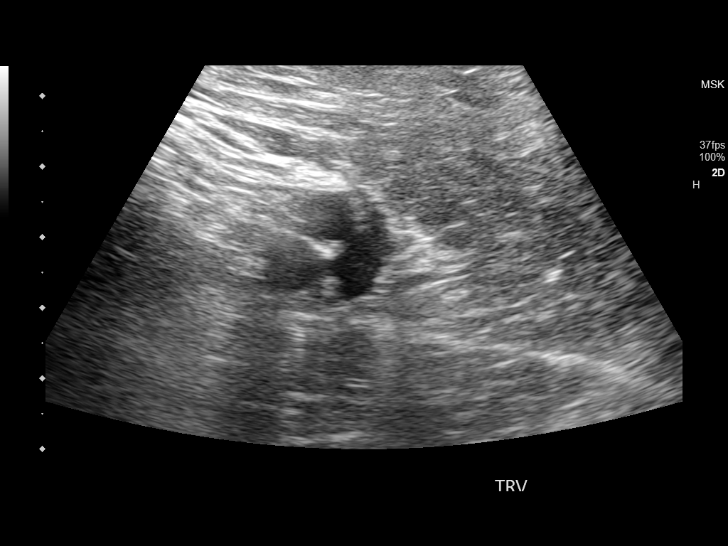
[im 7/14]
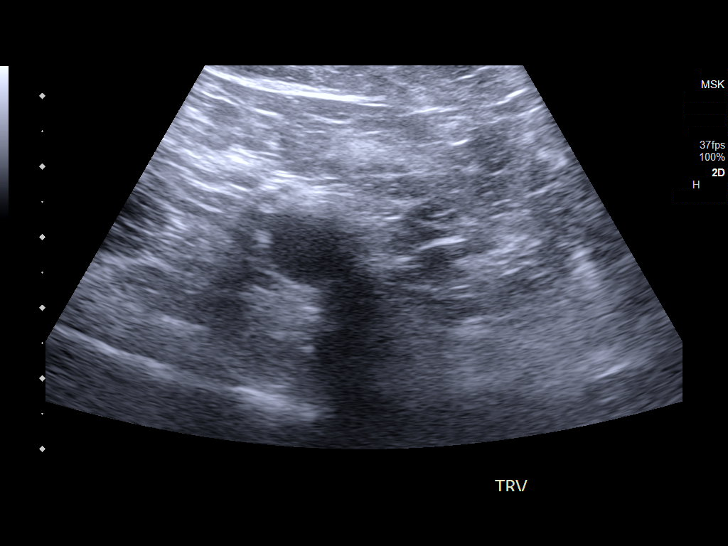
[im 8/14]
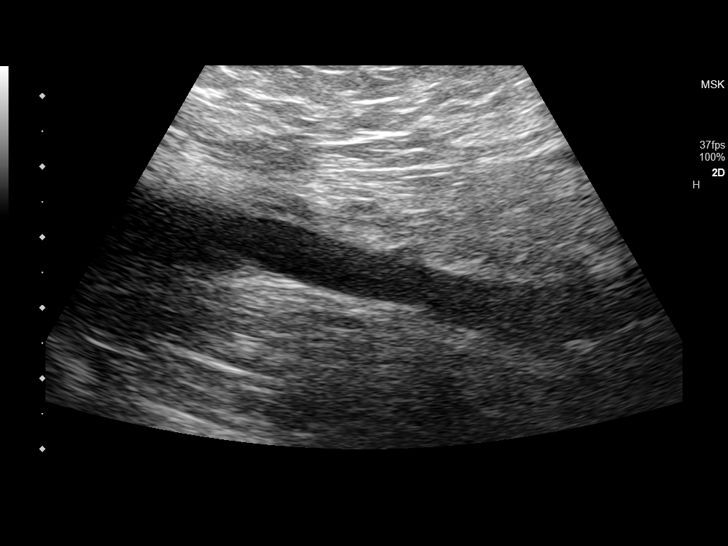
[im 9/14]
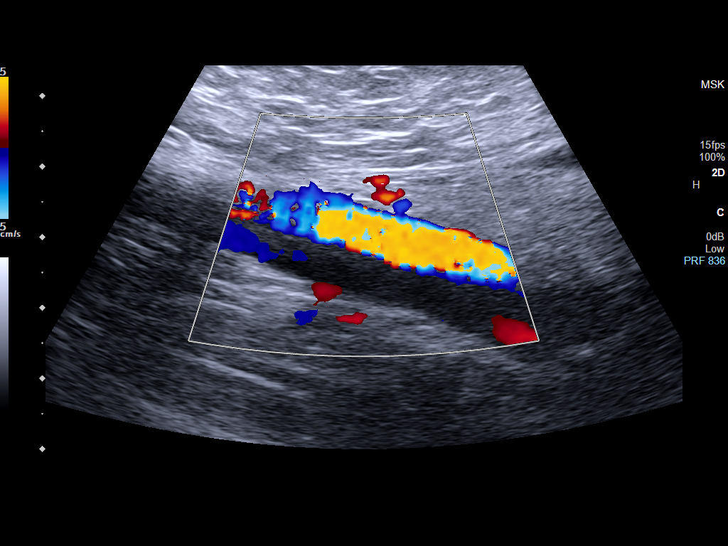
[im 10/14]
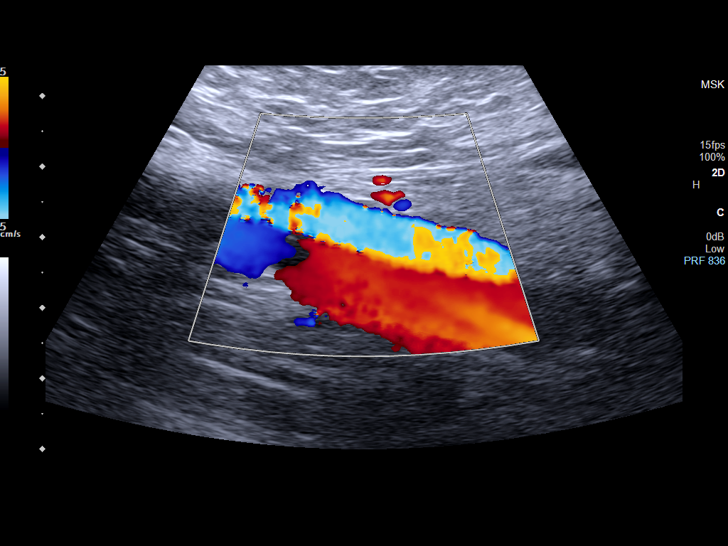
[im 11/14]
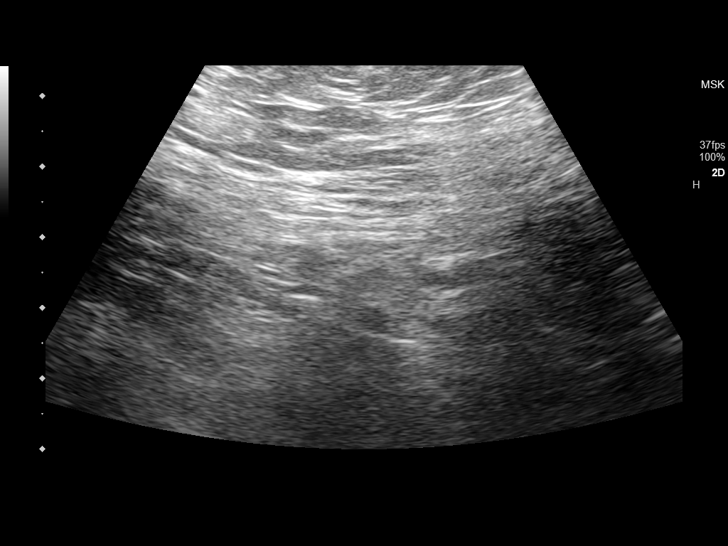
[im 12/14]
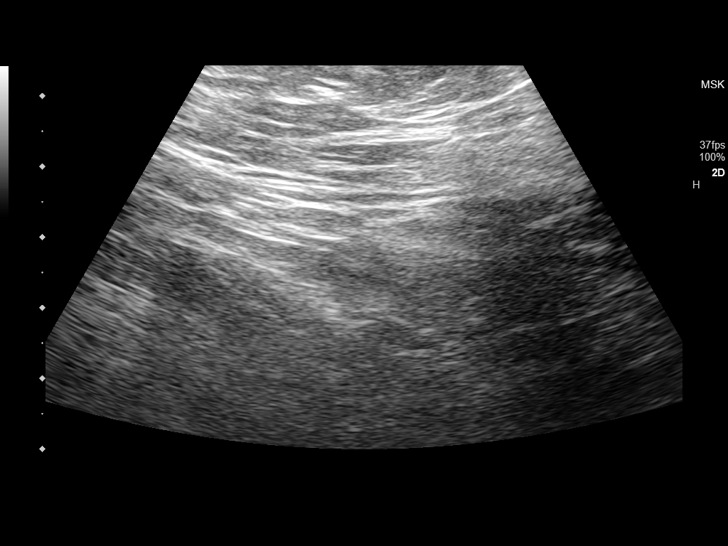
[im 13/14]
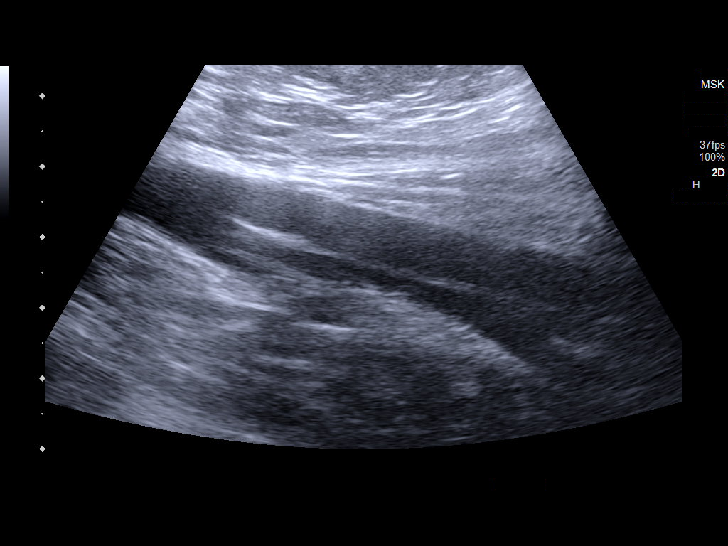
[im 14/14]
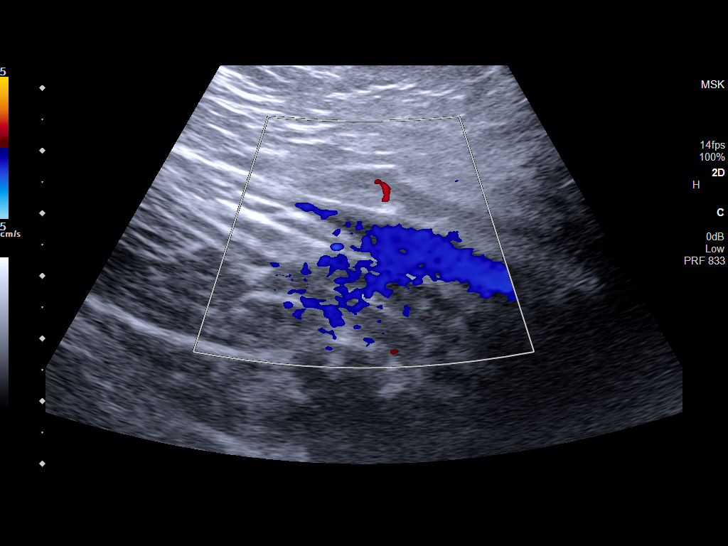

[14 of 14 positions shown; findings below may reference images not displayed]

FINDINGS: Focused ultrasound of the right groin in the area of the patient's
pain demonstrates no discrete soft tissue mass or fluid collection.
No inguinal hernia.
IMPRESSION: 1. Negative.

## 2021-06-04 ENCOUNTER — Encounter: Payer: Self-pay | Admitting: General Surgery

## 2021-07-09 ENCOUNTER — Emergency Department: Payer: 59

## 2021-07-09 ENCOUNTER — Emergency Department
Admission: EM | Admit: 2021-07-09 | Discharge: 2021-07-09 | Disposition: A | Discharge status: Home / Self Care | Payer: 59 | Attending: Emergency Medicine | Admitting: Emergency Medicine

## 2021-07-09 ENCOUNTER — Other Ambulatory Visit: Payer: Self-pay

## 2021-07-09 DIAGNOSIS — I10 Essential (primary) hypertension: Secondary | ICD-10-CM | POA: Diagnosis not present

## 2021-07-09 DIAGNOSIS — Z96653 Presence of artificial knee joint, bilateral: Secondary | ICD-10-CM | POA: Insufficient documentation

## 2021-07-09 DIAGNOSIS — F17220 Nicotine dependence, chewing tobacco, uncomplicated: Secondary | ICD-10-CM | POA: Insufficient documentation

## 2021-07-09 DIAGNOSIS — R079 Chest pain, unspecified: Secondary | ICD-10-CM | POA: Diagnosis present

## 2021-07-09 DIAGNOSIS — Z79899 Other long term (current) drug therapy: Secondary | ICD-10-CM | POA: Insufficient documentation

## 2021-07-09 DIAGNOSIS — I2510 Atherosclerotic heart disease of native coronary artery without angina pectoris: Secondary | ICD-10-CM | POA: Diagnosis not present

## 2021-07-09 DIAGNOSIS — Z7982 Long term (current) use of aspirin: Secondary | ICD-10-CM | POA: Diagnosis not present

## 2021-07-09 DIAGNOSIS — R0600 Dyspnea, unspecified: Secondary | ICD-10-CM | POA: Insufficient documentation

## 2021-07-09 DIAGNOSIS — R000 Tachycardia, unspecified: Secondary | ICD-10-CM | POA: Diagnosis not present

## 2021-07-09 LAB — BASIC METABOLIC PANEL
Anion gap: 10 (ref 5–15)
BUN: 11 mg/dL (ref 6–20)
CO2: 27 mmol/L (ref 22–32)
Calcium: 9.4 mg/dL (ref 8.9–10.3)
Chloride: 101 mmol/L (ref 98–111)
Creatinine, Ser: 0.8 mg/dL (ref 0.61–1.24)
GFR, Estimated: >60 mL/min (ref >60)
Glucose, Bld: 132 mg/dL — ABNORMAL HIGH (ref 70–99)
Potassium: 3.7 mmol/L (ref 3.5–5.1)
Sodium: 138 mmol/L (ref 135–145)

## 2021-07-09 LAB — CBC
HCT: 47.5 % (ref 39.0–52.0)
Hemoglobin: 16.4 g/dL (ref 13.0–17.0)
MCH: 32 pg (ref 26.0–34.0)
MCHC: 34.5 g/dL (ref 30.0–36.0)
MCV: 92.8 fL (ref 80.0–100.0)
Platelets: 241 10*3/uL (ref 150–400)
RBC: 5.12 MIL/uL (ref 4.22–5.81)
RDW: 12.3 % (ref 11.5–15.5)
WBC: 7.8 10*3/uL (ref 4.0–10.5)
nRBC: 0 % (ref 0.0–0.2)

## 2021-07-09 LAB — TROPONIN I (HIGH SENSITIVITY)
Troponin I (High Sensitivity): 4 ng/L (ref <18)
Troponin I (High Sensitivity): 3 ng/L (ref <18)

## 2021-07-09 NOTE — ED Triage Notes (Signed)
Pt states that he has been having chest pressure, feeling syncopal, fatigue for the past 3 weeks that isn't getting better and has become more frequent

## 2021-07-09 NOTE — ED Provider Notes (Signed)
[facility]  ____________________________________________   Event Date/Time   First MD Initiated Contact with Patient 07/09/21 [phone]     (approximate)  I have reviewed the triage vital signs and the nursing notes.   HISTORY  Chief Complaint Chest Pain   HPI [name] [name] is a 60 y.o. male with past medical history of hypertension, GERD, obesity, CAD who presents with chest pain dyspnea and feeling like he is going to pass out.  Patient symptoms have been going on for a month.  He describes a constant pressure-like sensation in the center of his chest that is not worse with exertion.  He also endorses dyspnea on exertion which is rather new for him.  No lower extremity edema.  He is particularly bothered by recurrent episodes where he feels like he is passing out.  Describes a feeling that something is coming over his body and then he feels like something "skips" and he blacks out.  He is not sure if he is actually losing consciousness.  He does not lose postural tone these occur both while he is sitting and standing is not worsened by walking or exerting himself.  He tells me that this is happening every 5 minutes.  [name] wife who is with him has not witnessed this.  Denies visual change, numbness weakness or headaches.         Past Medical History:  Diagnosis Date   Alcohol use    regularly 3 beers/day   CAD (coronary artery disease)    mild   Dyslipidemia    GERD (gastroesophageal reflux disease)    with esophagitis   History of Helicobacter pylori infection    Hypertension, essential    Obesity, Class I, BMI 30-34.9 01/19/2016    Patient Active Problem List   Diagnosis Date Noted   Right groin pain 08/02/2020   Head and face pain 03/13/2020   Neck pain 03/13/2020   Reflux esophagitis 01/60/1093   Helicobacter pylori gastrointestinal tract infection 10/18/2019   Steatosis of liver 10/18/2019   Left temporal headache 10/08/2019   Cervical  radiculopathy 12/02/2018   Hypercholesterolemia 12/01/2018   Pain of right shoulder joint on movement 08/28/2018   Full thickness rotator cuff tear 07/03/2018   Tendonitis of long head of biceps brachii of right shoulder 07/03/2018   Bilateral shoulder pain 06/06/2018   S/P cervical spinal fusion 03/12/2018   DDD (degenerative disc disease), cervical 01/20/2018   Acute chest pain 11/14/2016   Encounter for general adult medical examination with abnormal findings 02/29/2016   Alcohol use 02/29/2016   Current drinker 02/29/2016   Varicose veins of both lower extremities with pain 01/19/2016   Severe obesity (BMI 35.0-39.9) with comorbidity ([place]) 01/19/2016   CAD (coronary artery disease)    Dyslipidemia    Hypertension, essential    GERD (gastroesophageal reflux disease)    Impingement syndrome of right shoulder 01/13/2016    Past Surgical History:  Procedure Laterality Date   ANTERIOR CERVICAL DECOMP/DISCECTOMY FUSION  2012   C4-5 [facility])   ANTERIOR CERVICAL DECOMP/DISCECTOMY FUSION N/A 03/12/2018   ACDF C4-6, EXPLORATION OF FUSION C6-7, REMOVAL OF HARDWARE;  [name], [name], MD)   ARTERY BIOPSY Left 10/20/2019   TEMPORAL ARTERY BIOPSY - [name] Dolores Lory, MD   CARDIAC CATHETERIZATION  2013   no significant CAD, nl LVEF 60% [name])   COLONOSCOPY  2012   WNL per PCP report   CT CTA CORONARY W/CA SCORE W/CM &/OR WO/CM  2015   minor  luminal irregularities, Ca score = 90 [name])   KNEE ARTHROSCOPY Bilateral    ROTATOR CUFF REPAIR Right 2019   SCLEROTHERAPY     legs    Prior to Admission medications   Medication Sig Start Date End Date Taking? Authorizing Provider  aspirin EC 81 MG tablet Take 81 mg by mouth daily. Swallow whole.    [provider]  atorvastatin (LIPITOR) 40 MG tablet TAKE 1 TABLET(40 MG) BY MOUTH DAILY AT 6 PM 03/30/21   Ria Bush, MD  chlorthalidone (HYGROTON) 25 MG tablet Take 1 tablet (25 mg total) by mouth daily. 03/30/21   Ria Bush, MD   gabapentin (NEURONTIN) 100 MG capsule Take by mouth. 03/09/20   [provider]  Multiple Vitamin (MULTIVITAMIN WITH MINERALS) TABS tablet Take 1 tablet by mouth daily.    [provider]  omeprazole (PRILOSEC) 20 MG capsule TAKE 1 CAPSULE(20 MG) BY MOUTH DAILY 03/30/21   Ria Bush, MD    Allergies Patient has no known allergies.  Family History  Problem Relation Age of Onset   CAD Father 86       MI   Hyperlipidemia Father    Diabetes Paternal Grandfather    Cancer Mother 46       deceased from bone cancer   Stroke Neg Hx     Social History Social History   Tobacco Use   Smoking status: Never   Smokeless tobacco: Current    Types: Chew  Vaping Use   Vaping Use: Never used  Substance Use Topics   Alcohol use: Yes    Alcohol/week: 0.0 standard drinks    Comment: 8 oz whiskey a night   Drug use: No    Review of Systems   Review of Systems  Constitutional:  Negative for chills and fever.  Respiratory:  Positive for chest tightness and shortness of breath.   Cardiovascular:  Positive for chest pain. Negative for palpitations and leg swelling.  All other systems reviewed and are negative.  Physical Exam Updated Vital Signs BP (!) 144/82   Pulse 81   Temp 98.1 F (36.7 C) (Oral)   Resp 18   Ht 5\' 8"  (1.727 m)   Wt 113.4 kg   SpO2 97%   BMI 38.01 kg/m   Physical Exam Vitals and nursing note reviewed.  Constitutional:      General: He is not in acute distress.    Appearance: Normal appearance.  HENT:     Head: Normocephalic and atraumatic.  Eyes:     General: No scleral icterus.    Conjunctiva/sclera: Conjunctivae normal.  Cardiovascular:     Rate and Rhythm: Tachycardia present.     Heart sounds: Normal heart sounds.  Pulmonary:     Effort: Pulmonary effort is normal. No respiratory distress.     Breath sounds: Normal breath sounds. No wheezing.  Musculoskeletal:        General: No deformity or signs of injury.     Cervical  back: Normal range of motion.  Skin:    Coloration: Skin is not jaundiced or pale.  Neurological:     General: No focal deficit present.     Mental Status: He is alert and oriented to person, place, and time. Mental status is at baseline.     Comments: Aox3, nml speech  PERRL, EOMI, face symmetric, nml tongue movement  5/5 strength in the BL upper and lower extremities  Sensation grossly intact in the BL upper and lower extremities  Finger-nose-finger intact  BL   Psychiatric:        Mood and Affect: Mood normal.        Behavior: Behavior normal.     LABS (all labs ordered are listed, but only abnormal results are displayed)  Labs Reviewed  BASIC METABOLIC PANEL - Abnormal; Notable for the following components:      Result Value   Glucose, Bld 132 (*)    All other components within normal limits  CBC  TROPONIN I (HIGH SENSITIVITY)  TROPONIN I (HIGH SENSITIVITY)   ____________________________________________  EKG NSR, nml axis, nml intervals, no acute ischemic changes  ____________________________________________  RADIOLOGY Almeta Monas, personally viewed and evaluated these images (plain radiographs) as part of my medical decision making, as well as reviewing the written report by the radiologist.  ED MD interpretation:  I reviewed the CXR which does not show any acute cardiopulmonary process      ____________________________________________   PROCEDURES  Procedure(s) performed (including Critical Care):  Procedures   ____________________________________________   INITIAL IMPRESSION / ASSESSMENT AND PLAN / ED COURSE     Patient is a 60 year old male who presents both with ongoing chest pain and dyspnea on exertion as well as episodes of questionable loss of consciousness.  His chest pain does he has been going on for 1 month.  The chest pain is constant and nonexertional.  His EKG here is nonischemic and 2 troponins are negative.  He does describe  the pain currently.  Given his reassuring EKG and troponins is unlikely to be ischemic in nature.  Chest x-ray is without infiltrate.  Patient is most bothered by these episodes which she has somewhat difficulty describing but are associated with a whole body abnormal sensation and potential loss of consciousness.  However these episodes have not been witnessed and he has not lost postural tone and they only last for seconds at a time and he says it is occurring about every 5 minutes.  I did not witness this during the exam or history and patient's wife has not seen this either.  These occur both while sitting and while walking.  Would be an unusual presentation of syncope.  Differential includes absence seizure, narcolepsy or other neurologic or cardiac phenomenon.  His neurologic exam is normal here.  I had wanted to refer the patient both to cardiology and neurology however he eloped prior to finishing treatment.      ____________________________________________   FINAL CLINICAL IMPRESSION(S) / ED DIAGNOSES  Final diagnoses:  Chest pain, unspecified type     ED Discharge Orders     None        Note:  This document was prepared using Dragon voice recognition software and may include unintentional dictation errors.    Rada Hay, MD 07/09/21 [phone]

## 2021-07-25 DIAGNOSIS — I2089 Other forms of angina pectoris: Secondary | ICD-10-CM | POA: Diagnosis present

## 2021-07-25 DIAGNOSIS — I208 Other forms of angina pectoris: Secondary | ICD-10-CM | POA: Diagnosis present

## 2021-07-29 ENCOUNTER — Emergency Department: Payer: 59

## 2021-07-29 ENCOUNTER — Other Ambulatory Visit: Payer: Self-pay

## 2021-07-29 ENCOUNTER — Encounter: Payer: Self-pay | Admitting: Emergency Medicine

## 2021-07-29 ENCOUNTER — Emergency Department
Admission: EM | Admit: 2021-07-29 | Discharge: 2021-07-29 | Disposition: A | Discharge status: Home / Self Care | Payer: 59 | Attending: Emergency Medicine | Admitting: Emergency Medicine

## 2021-07-29 DIAGNOSIS — I2510 Atherosclerotic heart disease of native coronary artery without angina pectoris: Secondary | ICD-10-CM | POA: Insufficient documentation

## 2021-07-29 DIAGNOSIS — I200 Unstable angina: Secondary | ICD-10-CM | POA: Diagnosis not present

## 2021-07-29 DIAGNOSIS — R42 Dizziness and giddiness: Secondary | ICD-10-CM | POA: Insufficient documentation

## 2021-07-29 DIAGNOSIS — R519 Headache, unspecified: Secondary | ICD-10-CM | POA: Insufficient documentation

## 2021-07-29 DIAGNOSIS — E876 Hypokalemia: Secondary | ICD-10-CM | POA: Diagnosis not present

## 2021-07-29 DIAGNOSIS — Z79899 Other long term (current) drug therapy: Secondary | ICD-10-CM | POA: Insufficient documentation

## 2021-07-29 DIAGNOSIS — Z20822 Contact with and (suspected) exposure to covid-19: Secondary | ICD-10-CM | POA: Diagnosis not present

## 2021-07-29 DIAGNOSIS — R079 Chest pain, unspecified: Secondary | ICD-10-CM | POA: Insufficient documentation

## 2021-07-29 DIAGNOSIS — Z8249 Family history of ischemic heart disease and other diseases of the circulatory system: Secondary | ICD-10-CM | POA: Diagnosis not present

## 2021-07-29 DIAGNOSIS — E785 Hyperlipidemia, unspecified: Secondary | ICD-10-CM | POA: Diagnosis not present

## 2021-07-29 LAB — CBC
HCT: 43.8 % (ref 39.0–52.0)
Hemoglobin: 15.4 g/dL (ref 13.0–17.0)
MCH: 32.1 pg (ref 26.0–34.0)
MCHC: 35.2 g/dL (ref 30.0–36.0)
MCV: 91.3 fL (ref 80.0–100.0)
Platelets: 258 10*3/uL (ref 150–400)
RBC: 4.8 MIL/uL (ref 4.22–5.81)
RDW: 12 % (ref 11.5–15.5)
WBC: 6.5 10*3/uL (ref 4.0–10.5)
nRBC: 0 % (ref 0.0–0.2)

## 2021-07-29 LAB — BASIC METABOLIC PANEL
Anion gap: 7 (ref 5–15)
BUN: 13 mg/dL (ref 6–20)
CO2: 28 mmol/L (ref 22–32)
Calcium: 9 mg/dL (ref 8.9–10.3)
Chloride: 100 mmol/L (ref 98–111)
Creatinine, Ser: 0.69 mg/dL (ref 0.61–1.24)
GFR, Estimated: >60 mL/min (ref >60)
Glucose, Bld: 137 mg/dL — ABNORMAL HIGH (ref 70–99)
Potassium: 3.2 mmol/L — ABNORMAL LOW (ref 3.5–5.1)
Sodium: 135 mmol/L (ref 135–145)

## 2021-07-29 LAB — TROPONIN I (HIGH SENSITIVITY)
Troponin I (High Sensitivity): <2 ng/L (ref <18)
Troponin I (High Sensitivity): 3 ng/L (ref <18)

## 2021-07-29 LAB — D-DIMER, QUANTITATIVE: D-Dimer, Quant: 0.4 ug/mL-FEU (ref 0.00–0.50)

## 2021-07-29 LAB — MAGNESIUM: Magnesium: 1.9 mg/dL (ref 1.7–2.4)

## 2021-07-29 LAB — RESP PANEL BY RT-PCR (FLU A&B, COVID) ARPGX2
Influenza A by PCR: NEGATIVE
Influenza B by PCR: NEGATIVE
SARS Coronavirus 2 by RT PCR: NEGATIVE

## 2021-07-29 LAB — BRAIN NATRIURETIC PEPTIDE: B Natriuretic Peptide: 15.9 pg/mL (ref 0.0–100.0)

## 2021-07-29 MED ORDER — ACETAMINOPHEN 500 MG PO TABS
1000.0000 mg | ORAL_TABLET | Freq: Once | ORAL | Status: AC
  Administered 2021-07-29: 17:00:00 1000 mg via ORAL
  Filled 2021-07-29: qty 2

## 2021-07-29 MED ORDER — POTASSIUM CHLORIDE CRYS ER 20 MEQ PO TBCR
40.0000 meq | EXTENDED_RELEASE_TABLET | Freq: Once | ORAL | Status: AC
  Administered 2021-07-29: 17:00:00 40 meq via ORAL
  Filled 2021-07-29: qty 2

## 2021-07-29 MED ORDER — NITROGLYCERIN 0.4 MG SL SUBL: 0.4000 mg | SUBLINGUAL_TABLET | SUBLINGUAL | Status: DC | PRN

## 2021-07-29 NOTE — ED Provider Notes (Signed)
[facility] Emergency Department Provider Note  ____________________________________________   Event Date/Time   First MD Initiated Contact with Patient 07/29/21 1539     (approximate)  I have reviewed the triage vital signs and the nursing notes.   HISTORY  Chief Complaint Chest Pain and Shortness of Breath   HPI [name] [name] is a 60 y.o. male past medical history of angina with planned left heart cath on 12-6 following a NM myocardial perfusion SPECT stress test on 11/16 that showed some reversible ischemia, HTN, HDL, GERD, and obesity and DDD who presents for assessment of ongoing chest pain shortness of breath and dizziness.  Patient also states he has had a headache and states that all of symptoms have been ongoing for 2 or 3 weeks but feels it has been getting worse.  He states any exertion makes his chest pain much worse.  He states he feels like he is passing out for a couple seconds throughout the day but has not fallen or hit his head.  He denies any cough, fevers, nausea, vomiting, diarrhea, abdominal pain, back pain, rash or extremity pain.  No focal extremity weakness numbness or tingling.  Patient states he has an occasional drink every couple days but is not a daily drinker.  He denies tobacco abuse or illicit drug use.  He is not any blood thinners.  No other acute concerns at this time.         Past Medical History:  Diagnosis Date   Alcohol use    regularly 3 beers/day   CAD (coronary artery disease)    mild   Dyslipidemia    GERD (gastroesophageal reflux disease)    with esophagitis   History of Helicobacter pylori infection    Hypertension, essential    Obesity, Class I, BMI 30-34.9 01/19/2016    Patient Active Problem List   Diagnosis Date Noted   Stable angina ([place]) 07/25/2021   Right groin pain 08/02/2020   Head and face pain 03/13/2020   Neck pain 03/13/2020   Reflux esophagitis 96/78/9381   Helicobacter pylori  gastrointestinal tract infection 10/18/2019   Steatosis of liver 10/18/2019   Left temporal headache 10/08/2019   Cervical radiculopathy 12/02/2018   Hypercholesterolemia 12/01/2018   Pain of right shoulder joint on movement 08/28/2018   Full thickness rotator cuff tear 07/03/2018   Tendonitis of long head of biceps brachii of right shoulder 07/03/2018   Bilateral shoulder pain 06/06/2018   S/P cervical spinal fusion 03/12/2018   DDD (degenerative disc disease), cervical 01/20/2018   Acute chest pain 11/14/2016   Encounter for general adult medical examination with abnormal findings 02/29/2016   Alcohol use 02/29/2016   Current drinker 02/29/2016   Varicose veins of both lower extremities with pain 01/19/2016   Severe obesity (BMI 35.0-39.9) with comorbidity ([place]) 01/19/2016   CAD (coronary artery disease)    Dyslipidemia    Hypertension, essential    GERD (gastroesophageal reflux disease)    Impingement syndrome of right shoulder 01/13/2016    Past Surgical History:  Procedure Laterality Date   ANTERIOR CERVICAL DECOMP/DISCECTOMY FUSION  2012   C4-5 [facility])   ANTERIOR CERVICAL DECOMP/DISCECTOMY FUSION N/A 03/12/2018   ACDF C4-6, EXPLORATION OF FUSION C6-7, REMOVAL OF HARDWARE;  [name], Duane Lope, MD)   ARTERY BIOPSY Left 10/20/2019   TEMPORAL ARTERY BIOPSY - [name], MD   CARDIAC CATHETERIZATION  2013   no significant CAD, nl LVEF 60% [name])   COLONOSCOPY  2012  WNL per PCP report   CT CTA CORONARY W/CA SCORE W/CM &/OR WO/CM  2015   minor luminal irregularities, Ca score = 90 [name])   KNEE ARTHROSCOPY Bilateral    ROTATOR CUFF REPAIR Right 2019   SCLEROTHERAPY     legs    Prior to Admission medications   Medication Sig Start Date End Date Taking? Authorizing Provider  atorvastatin (LIPITOR) 40 MG tablet TAKE 1 TABLET(40 MG) BY MOUTH DAILY AT 6 PM 03/30/21   Ria Bush, MD  chlorthalidone (HYGROTON) 25 MG tablet Take 1 tablet (25 mg total) by mouth daily.  03/30/21   Ria Bush, MD  omeprazole (PRILOSEC) 20 MG capsule TAKE 1 CAPSULE(20 MG) BY MOUTH DAILY 03/30/21   Ria Bush, MD    Allergies Patient has no known allergies.  Family History  Problem Relation Age of Onset   CAD Father 31       MI   Hyperlipidemia Father    Diabetes Paternal Grandfather    Cancer Mother 50       deceased from bone cancer   Stroke Neg Hx     Social History Social History   Tobacco Use   Smoking status: Never   Smokeless tobacco: Current    Types: Chew  Vaping Use   Vaping Use: Never used  Substance Use Topics   Alcohol use: Yes    Alcohol/week: 0.0 standard drinks    Comment: 8 oz whiskey a night   Drug use: No    Review of Systems  Review of Systems  Constitutional:  Negative for chills and fever.  HENT:  Negative for sore throat.   Eyes:  Negative for pain.  Respiratory:  Positive for shortness of breath. Negative for cough and stridor.   Cardiovascular:  Positive for chest pain.  Gastrointestinal:  Negative for vomiting.  Genitourinary:  Negative for dysuria.  Musculoskeletal:  Negative for myalgias.  Skin:  Negative for rash.  Neurological:  Negative for seizures, loss of consciousness and headaches.  Psychiatric/Behavioral:  Negative for suicidal ideas.   All other systems reviewed and are negative.    ____________________________________________   PHYSICAL EXAM:  VITAL SIGNS: ED Triage Vitals  Enc Vitals Group     BP 07/29/21 1441 (!) 142/84     Pulse Rate 07/29/21 1441 76     Resp 07/29/21 1441 17     Temp 07/29/21 1441 97.7 F (36.5 C)     Temp Source 07/29/21 1441 Oral     SpO2 07/29/21 1441 96 %     Weight 07/29/21 1428 251 lb 5.2 oz (114 kg)     Height 07/29/21 1428 5\' 6"  (1.676 m)     Head Circumference --      Peak Flow --      Pain Score 07/29/21 1428 10     Pain Loc --      Pain Edu? --      Excl. in [place]? --    Vitals:   07/29/21 1600 07/29/21 1722  BP: (!) 131/51 139/77  Pulse: 69 69   Resp: 16 15  Temp:    SpO2: 95% 98%   Physical Exam Vitals and nursing note reviewed.  Constitutional:      General: He is not in acute distress.    Appearance: He is well-developed. He is obese.  HENT:     Head: Normocephalic and atraumatic.  Eyes:     Conjunctiva/sclera: Conjunctivae normal.  Cardiovascular:     Rate and Rhythm: Normal rate  and regular rhythm.     Heart sounds: No murmur heard. Pulmonary:     Effort: Pulmonary effort is normal. No respiratory distress.     Breath sounds: Normal breath sounds.  Abdominal:     Palpations: Abdomen is soft.     Tenderness: There is no abdominal tenderness.  Musculoskeletal:        General: No swelling.     Cervical back: Neck supple.  Skin:    General: Skin is warm and dry.     Capillary Refill: Capillary refill takes less than 2 seconds.  Neurological:     Mental Status: He is alert.  Psychiatric:        Mood and Affect: Mood normal.    Cranial nerves II through XII grossly intact.  No pronator drift.  No finger dysmetria.  Symmetric 5/5 strength of all extremities.  Sensation intact to light touch in all extremities.  Unremarkable unassisted gait.  ____________________________________________   LABS (all labs ordered are listed, but only abnormal results are displayed)  Labs Reviewed  BASIC METABOLIC PANEL - Abnormal; Notable for the following components:      Result Value   Potassium 3.2 (*)    Glucose, Bld 137 (*)    All other components within normal limits  RESP PANEL BY RT-PCR (FLU A&B, COVID) ARPGX2  CBC  BRAIN NATRIURETIC PEPTIDE  MAGNESIUM  D-DIMER, QUANTITATIVE  TROPONIN I (HIGH SENSITIVITY)  TROPONIN I (HIGH SENSITIVITY)   ____________________________________________  EKG  ECG remarkable for sinus rhythm with a ventricular rate of 76, normal axis, Q waves in V3 without other clear evidence of acute ischemia or significant arrhythmia. ____________________________________________  RADIOLOGY  ED  MD interpretation:    CT head without evidence of skull fracture, intra, hemorrhage, mass-effect, edema, ventriculomegaly or other acute process.  There is mild maxillary sinusitis.  Chest x-ray shows some atelectasis in the lower lobes without large effusion, pneumothorax, clear focal consolidation or overt edema.  No other clear acute thoracic process.  Official radiology report(s): DG Chest 2 View  Result Date: 07/29/2021 CLINICAL DATA:  Chest pain. EXAM: CHEST - 2 VIEW COMPARISON:  July 09, 2021 FINDINGS: Cardiomediastinal silhouette is normal. Mediastinal contours appear intact. Left lower lobe peribronchial linear opacities. Osseous structures are without acute abnormality. Soft tissues are grossly normal. IMPRESSION: Left lower lobe peribronchial linear opacities may represent atelectasis or peribronchial airspace consolidation. Electronically Signed   By: Fidela Salisbury M.D.   On: 07/29/2021 16:04   CT HEAD WO CONTRAST (5MM)  Result Date: 07/29/2021 CLINICAL DATA:  Headache. EXAM: CT HEAD WITHOUT CONTRAST TECHNIQUE: Contiguous axial images were obtained from the base of the skull through the vertex without intravenous contrast. COMPARISON:  October 15, 2019 FINDINGS: Brain: No evidence of acute infarction, hemorrhage, hydrocephalus, extra-axial collection or mass lesion/mass effect. Vascular: No hyperdense vessel or unexpected calcification. Skull: Normal. Negative for fracture or focal lesion. Sinuses/Orbits: Mucous retention cysts in the right maxillary sinus. Minimal opacification of the left maxillary sinus. Other: None. IMPRESSION: 1. No acute intracranial abnormality. 2. Maxillary sinusitis. Electronically Signed   By: Fidela Salisbury M.D.   On: 07/29/2021 16:57    ____________________________________________   PROCEDURES  Procedure(s) performed (including Critical Care):  .1-3 Lead EKG Interpretation Performed by: Lucrezia Starch, MD Authorized by: Lucrezia Starch, MD     Interpretation: normal     ECG rate assessment: normal     Rhythm: sinus rhythm     Ectopy: none     Conduction:  normal     ____________________________________________   INITIAL IMPRESSION / ASSESSMENT AND PLAN / ED COURSE        Patient presents with above-stated history exam for assessment of worsening chest discomfort associate with shortness of breath and dizzy spells that he states have been getting worse and occur dozens of times throughout the day as well as associated headache.  On arrival patient is slightly hypertensive at 142/84 with otherwise stable vital signs on room air.  He has a nonfocal neurological exam and his lungs are clear bilaterally.  Concern for unstable angina, PE, pneumonia, bronchitis, anemia, [place] derangements, arrhythmia, possible intracranial mass or increased pressure given patient reports headache dizziness.  No history or exam features suggest acute infectious process in the head or neck or recent trauma.  He does not appear intoxicated.  Low suspicion for acute endocrine derangement.  D-dimer 0.4 not consistent with PE.  COVID influenza PCR is negative.  BNP is double digits and overall patient does not appear grossly volume overloaded.  CBC shows no leukocytosis or acute anemia.  BMP remarkable for K3.2 without any other significant electrolyte or metabolic derangements.  ECG remarkable for sinus rhythm with a ventricular rate of 76, normal axis, Q waves in V3 without other clear evidence of acute ischemia or significant arrhythmia.  Troponins nonelevated x2.  However I am still concerned about unstable angina given patient's history and recent stress showing inducible ischemia.  Attempted to reach Dr. Clayborn Bigness x3 times and was able to do so reached out to [facility] cardiologist Dr Rayann Heman who agreed observation overnight was reasonable given patient's history and new EKG changes as well as formal consult tomorrow morning.  CT head without  evidence of skull fracture, intra, hemorrhage, mass-effect, edema, ventriculomegaly or other acute process.  There is mild maxillary sinusitis.  Chest x-ray shows some atelectasis in the lower lobes without large effusion, pneumothorax, clear focal consolidation or overt edema.  No other clear acute thoracic process.  Explained my concerns to patient for unstable angina and this can be life-threatening and could precipitate life-threatening heart attack could be fatal.  In addition I am concerned that this may be contributing to his syncopal episodes.  Patient states he understands this but does not wish to be admitted to the hospital because he feels he can sleep more comfortably at home and will follow up with his cardiologist tomorrow.  I think he has capacity to make this decision and understood the risks after discussion.  He was discharged against my advice.      ____________________________________________   FINAL CLINICAL IMPRESSION(S) / ED DIAGNOSES  Final diagnoses:  Chest pain, unspecified type  Unstable angina (HCC)  Hypokalemia  Nonintractable headache, unspecified chronicity pattern, unspecified headache type    Medications  nitroGLYCERIN (NITROSTAT) SL tablet 0.4 mg (has no administration in time range)  potassium chloride SA (KLOR-CON M) CR tablet 40 mEq (40 mEq Oral Given 07/29/21 1720)  acetaminophen (TYLENOL) tablet 1,000 mg (1,000 mg Oral Given 07/29/21 1720)     ED Discharge Orders     None        Note:  This document was prepared using Dragon voice recognition software and may include unintentional dictation errors.    Lucrezia Starch, MD 07/29/21 [phone]

## 2021-07-29 NOTE — ED Notes (Signed)
Pt very adamant about DC tonight and going to his scheduled heart cath on Tuesday. MD made aware.

## 2021-07-29 NOTE — ED Triage Notes (Signed)
Pt reports constant cp for the last 4 weeks. Pt has a cath scheduled for 12/6 but the pain has become severe and he feels SOB so he came to the ED. Pt reports has had several tests etc to determine the cause.

## 2021-07-31 ENCOUNTER — Encounter
Admission: RE | Disposition: A | Discharge status: Home / Self Care | Payer: Self-pay | Source: Home / Self Care | Attending: Internal Medicine

## 2021-07-31 ENCOUNTER — Other Ambulatory Visit: Payer: Self-pay

## 2021-07-31 ENCOUNTER — Ambulatory Visit
Admission: RE | Admit: 2021-07-31 | Discharge: 2021-07-31 | Disposition: A | Discharge status: Home / Self Care | Payer: 59 | Attending: Internal Medicine | Admitting: Internal Medicine

## 2021-07-31 ENCOUNTER — Encounter: Payer: Self-pay | Admitting: Internal Medicine

## 2021-07-31 DIAGNOSIS — I208 Other forms of angina pectoris: Secondary | ICD-10-CM | POA: Diagnosis present

## 2021-07-31 DIAGNOSIS — E785 Hyperlipidemia, unspecified: Secondary | ICD-10-CM | POA: Insufficient documentation

## 2021-07-31 DIAGNOSIS — R079 Chest pain, unspecified: Secondary | ICD-10-CM | POA: Insufficient documentation

## 2021-07-31 DIAGNOSIS — Z8249 Family history of ischemic heart disease and other diseases of the circulatory system: Secondary | ICD-10-CM | POA: Insufficient documentation

## 2021-07-31 DIAGNOSIS — I2089 Other forms of angina pectoris: Secondary | ICD-10-CM | POA: Diagnosis present

## 2021-07-31 DIAGNOSIS — I2510 Atherosclerotic heart disease of native coronary artery without angina pectoris: Secondary | ICD-10-CM | POA: Insufficient documentation

## 2021-07-31 DIAGNOSIS — R9430 Abnormal result of cardiovascular function study, unspecified: Secondary | ICD-10-CM

## 2021-07-31 HISTORY — PX: LEFT HEART CATH AND CORONARY ANGIOGRAPHY: CATH118249

## 2021-07-31 SURGERY — LEFT HEART CATH AND CORONARY ANGIOGRAPHY
Anesthesia: Moderate Sedation

## 2021-07-31 MED ORDER — SODIUM CHLORIDE 0.9% FLUSH: 3.0000 mL | Freq: Two times a day (BID) | INTRAVENOUS | Status: DC

## 2021-07-31 MED ORDER — FENTANYL CITRATE (PF) 100 MCG/2ML IJ SOLN
INTRAMUSCULAR | Status: DC | PRN
  Administered 2021-07-31: 50 ug via INTRAVENOUS

## 2021-07-31 MED ORDER — HEPARIN (PORCINE) IN NACL 1000-0.9 UT/500ML-% IV SOLN
INTRAVENOUS | Status: AC
  Filled 2021-07-31: qty 1000

## 2021-07-31 MED ORDER — IOHEXOL 350 MG/ML SOLN
INTRAVENOUS | Status: DC | PRN
  Administered 2021-07-31: 65 mL

## 2021-07-31 MED ORDER — HEPARIN SODIUM (PORCINE) 1000 UNIT/ML IJ SOLN
INTRAMUSCULAR | Status: AC
  Filled 2021-07-31: qty 10

## 2021-07-31 MED ORDER — MIDAZOLAM HCL 2 MG/2ML IJ SOLN
INTRAMUSCULAR | Status: DC | PRN
  Administered 2021-07-31: 1 mg via INTRAVENOUS

## 2021-07-31 MED ORDER — ASPIRIN 81 MG PO CHEW
81.0000 mg | CHEWABLE_TABLET | ORAL | Status: AC
  Administered 2021-07-31: 81 mg via ORAL

## 2021-07-31 MED ORDER — ONDANSETRON HCL 4 MG/2ML IJ SOLN: 4.0000 mg | Freq: Four times a day (QID) | INTRAMUSCULAR | Status: DC | PRN

## 2021-07-31 MED ORDER — LIDOCAINE HCL (PF) 1 % IJ SOLN
INTRAMUSCULAR | Status: DC | PRN
  Administered 2021-07-31: 2 mL

## 2021-07-31 MED ORDER — SODIUM CHLORIDE 0.9 % WEIGHT BASED INFUSION
3.0000 mL/kg/h | INTRAVENOUS | Status: AC
  Administered 2021-07-31: 3 mL/kg/h via INTRAVENOUS

## 2021-07-31 MED ORDER — HYDRALAZINE HCL 20 MG/ML IJ SOLN: 10.0000 mg | INTRAMUSCULAR | Status: DC | PRN

## 2021-07-31 MED ORDER — SODIUM CHLORIDE 0.9 % WEIGHT BASED INFUSION: 1.0000 mL/kg/h | INTRAVENOUS | Status: DC

## 2021-07-31 MED ORDER — ACETAMINOPHEN 325 MG PO TABS: 650.0000 mg | ORAL_TABLET | ORAL | Status: DC | PRN

## 2021-07-31 MED ORDER — MIDAZOLAM HCL 2 MG/2ML IJ SOLN
INTRAMUSCULAR | Status: AC
  Filled 2021-07-31: qty 2

## 2021-07-31 MED ORDER — FENTANYL CITRATE (PF) 100 MCG/2ML IJ SOLN
INTRAMUSCULAR | Status: AC
  Filled 2021-07-31: qty 2

## 2021-07-31 MED ORDER — VERAPAMIL HCL 2.5 MG/ML IV SOLN
INTRAVENOUS | Status: DC | PRN
  Administered 2021-07-31: 2.5 mg via INTRAVENOUS

## 2021-07-31 MED ORDER — LIDOCAINE HCL 1 % IJ SOLN
INTRAMUSCULAR | Status: AC
  Filled 2021-07-31: qty 20

## 2021-07-31 MED ORDER — VERAPAMIL HCL 2.5 MG/ML IV SOLN
INTRAVENOUS | Status: AC
  Filled 2021-07-31: qty 2

## 2021-07-31 MED ORDER — SODIUM CHLORIDE 0.9 % IV SOLN: 250.0000 mL | INTRAVENOUS | Status: DC | PRN

## 2021-07-31 MED ORDER — HEPARIN SODIUM (PORCINE) 1000 UNIT/ML IJ SOLN
INTRAMUSCULAR | Status: DC | PRN
  Administered 2021-07-31: 5000 [IU] via INTRAVENOUS

## 2021-07-31 MED ORDER — HEPARIN (PORCINE) IN NACL 1000-0.9 UT/500ML-% IV SOLN
INTRAVENOUS | Status: DC | PRN
  Administered 2021-07-31 (×2): 500 mL

## 2021-07-31 MED ORDER — SODIUM CHLORIDE 0.9% FLUSH: 3.0000 mL | INTRAVENOUS | Status: DC | PRN

## 2021-07-31 MED ORDER — ASPIRIN 81 MG PO CHEW
CHEWABLE_TABLET | ORAL | Status: AC
  Filled 2021-07-31: qty 1

## 2021-07-31 MED ORDER — LABETALOL HCL 5 MG/ML IV SOLN: 10.0000 mg | INTRAVENOUS | Status: DC | PRN

## 2021-07-31 SURGICAL SUPPLY — 10 items
CATH 5FR JL3.5 JR4 ANG PIG MP (CATHETERS) ×2 IMPLANT
DEVICE RAD COMP TR BAND LRG (VASCULAR PRODUCTS) ×2 IMPLANT
DRAPE BRACHIAL (DRAPES) ×2 IMPLANT
GLIDESHEATH SLEND SS 6F .021 (SHEATH) ×2 IMPLANT
GUIDEWIRE INQWIRE 1.5J.035X260 (WIRE) ×1 IMPLANT
INQWIRE 1.5J .035X260CM (WIRE) ×2
PACK CARDIAC CATH (CUSTOM PROCEDURE TRAY) ×2 IMPLANT
PROTECTION STATION PRESSURIZED (MISCELLANEOUS) ×2
SET ATX SIMPLICITY (MISCELLANEOUS) ×2 IMPLANT
STATION PROTECTION PRESSURIZED (MISCELLANEOUS) ×1 IMPLANT

## 2021-08-01 ENCOUNTER — Encounter: Payer: Self-pay | Admitting: Internal Medicine

## 2021-08-07 ENCOUNTER — Other Ambulatory Visit: Payer: Self-pay

## 2021-08-07 ENCOUNTER — Ambulatory Visit (INDEPENDENT_AMBULATORY_CARE_PROVIDER_SITE_OTHER): Payer: 59 | Admitting: Family Medicine

## 2021-08-07 ENCOUNTER — Encounter: Payer: Self-pay | Admitting: Family Medicine

## 2021-08-07 VITALS — BP 140/78 | HR 74 | Temp 97.9°F | Ht 68.5 in | Wt 261.2 lb

## 2021-08-07 DIAGNOSIS — Z125 Encounter for screening for malignant neoplasm of prostate: Secondary | ICD-10-CM

## 2021-08-07 DIAGNOSIS — R1031 Right lower quadrant pain: Secondary | ICD-10-CM

## 2021-08-07 DIAGNOSIS — I2510 Atherosclerotic heart disease of native coronary artery without angina pectoris: Secondary | ICD-10-CM

## 2021-08-07 DIAGNOSIS — K219 Gastro-esophageal reflux disease without esophagitis: Secondary | ICD-10-CM

## 2021-08-07 DIAGNOSIS — Z0001 Encounter for general adult medical examination with abnormal findings: Secondary | ICD-10-CM

## 2021-08-07 DIAGNOSIS — I10 Essential (primary) hypertension: Secondary | ICD-10-CM

## 2021-08-07 DIAGNOSIS — R7303 Prediabetes: Secondary | ICD-10-CM

## 2021-08-07 DIAGNOSIS — R42 Dizziness and giddiness: Secondary | ICD-10-CM | POA: Diagnosis not present

## 2021-08-07 DIAGNOSIS — Z789 Other specified health status: Secondary | ICD-10-CM

## 2021-08-07 DIAGNOSIS — L989 Disorder of the skin and subcutaneous tissue, unspecified: Secondary | ICD-10-CM | POA: Insufficient documentation

## 2021-08-07 DIAGNOSIS — E785 Hyperlipidemia, unspecified: Secondary | ICD-10-CM

## 2021-08-07 DIAGNOSIS — Z1211 Encounter for screening for malignant neoplasm of colon: Secondary | ICD-10-CM

## 2021-08-07 LAB — COMPREHENSIVE METABOLIC PANEL
ALT: 60 U/L — ABNORMAL HIGH (ref 0–53)
AST: 31 U/L (ref 0–37)
Albumin: 4.1 g/dL (ref 3.5–5.2)
Alkaline Phosphatase: 98 U/L (ref 39–117)
BUN: 13 mg/dL (ref 6–23)
CO2: 30 mEq/L (ref 19–32)
Calcium: 9.6 mg/dL (ref 8.4–10.5)
Chloride: 103 mEq/L (ref 96–112)
Creatinine, Ser: 0.69 mg/dL (ref 0.40–1.50)
GFR: 101.53 mL/min (ref >60.00)
Glucose, Bld: 86 mg/dL (ref 70–99)
Potassium: 4.6 mEq/L (ref 3.5–5.1)
Sodium: 139 mEq/L (ref 135–145)
Total Bilirubin: 0.6 mg/dL (ref 0.2–1.2)
Total Protein: 6.4 g/dL (ref 6.0–8.3)

## 2021-08-07 LAB — LIPID PANEL
Cholesterol: 182 mg/dL (ref 0–200)
HDL: 36.7 mg/dL — ABNORMAL LOW (ref >39.00)
LDL Cholesterol: 117 mg/dL — ABNORMAL HIGH (ref 0–99)
NonHDL: 145.79
Total CHOL/HDL Ratio: 5
Triglycerides: 145 mg/dL (ref 0.0–149.0)
VLDL: 29 mg/dL (ref 0.0–40.0)

## 2021-08-07 LAB — HEMOGLOBIN A1C: Hgb A1c MFr Bld: 6.2 % (ref 4.6–6.5)

## 2021-08-07 LAB — TSH: TSH: 2.46 u[IU]/mL (ref 0.35–5.50)

## 2021-08-07 LAB — PSA: PSA: 0.21 ng/mL (ref 0.10–4.00)

## 2021-08-07 MED ORDER — OMEPRAZOLE 20 MG PO CPDR: DELAYED_RELEASE_CAPSULE | ORAL | 3 refills | Status: DC

## 2021-08-07 MED ORDER — ATORVASTATIN CALCIUM 40 MG PO TABS: ORAL_TABLET | ORAL | 3 refills | Status: DC

## 2021-08-07 NOTE — Assessment & Plan Note (Signed)
Poorly healing wound present for 1+ month.  Will refer to derm for evaluation.

## 2021-08-07 NOTE — Progress Notes (Signed)
Patient ID: [name] [name], male    DOB: 1960-09-14, 60 y.o.   MRN: [id number]  This visit was conducted in person.  BP 140/78    Pulse 74    Temp 97.9 F (36.6 C) (Temporal)    Ht 5' 8.5" (1.74 m)    Wt 261 lb 4 oz (118.5 kg)    SpO2 99%    BMI 39.15 kg/m   Orthostatic VS for the past 24 hrs (Last 3 readings):  BP- Lying BP- Standing at 3 minutes  08/07/21 0950 -- 126/80  08/07/21 0946 136/84 --   CC: CPE Subjective:   HPI: [name] [name] is a 60 y.o. male presenting on 08/07/2021 for Annual Exam   R groin pain - saw gen surgery with reassuring [place], also had reassuring CT scan. Declines ortho eval at this time.   Chest pain, dizziness, dyspnea with lightheadedness s/p ER eval x2 (found to have low potassium, but D dimer was normal as was CBC, Cr and troponin levels) - saw cardiology Nehemiah Massed) with abnormal stress test (small anteriolateral ischemia) then s/p L heart catheterization with mild LAD 20% stenosis - rec medical management. Symptoms have improved over the past week, but still notes ongoing dizziness. Cardiology stopped chlorthalidone with some benefit. No vertigo or syncope. Symptoms are not orthostatic in nature.   Ongoing L temporal headache s/p normal temporal artery biopsy - now off gabapentin due to above lightheadedness.   GERD managed with omeprazole 20mg  daily.   Preventative: COLONOSCOPY Date: 2012 WNL with benign polyps per PCP report (from [place]). Agrees to repeat.  Prostate screening - discussed, declines screening. Nocturia x1.  Lung cancer screening - not eligible Flu shot - declines Td 01/2018 Covid vaccine - J&J 06/2020, J&J booster x1 Shingrix - declines at this time Seat belt use discussed Sunscreen use discussed. No changing moles on skin. Poorly healing scaly sore to right lateral forearm - will refer to derm Non smoker  Alcohol - 4-5 shots whiskey/wk  Dentist - not recently  Eye exam - last year   Lives with wife, 1 dog Edu: HS Occ:  maintenance Activity: active at work (walking) - limited by neck pain Diet: some water, some fruits/vegetables      Relevant past medical, surgical, family and social history reviewed and updated as indicated. Interim medical history since our last visit reviewed. Allergies and medications reviewed and updated. Outpatient Medications Prior to Visit  Medication Sig Dispense Refill   atorvastatin (LIPITOR) 40 MG tablet TAKE 1 TABLET(40 MG) BY MOUTH DAILY AT 6 PM 90 tablet 0   omeprazole (PRILOSEC) 20 MG capsule TAKE 1 CAPSULE(20 MG) BY MOUTH DAILY 90 capsule 0   chlorthalidone (HYGROTON) 25 MG tablet Take 1 tablet (25 mg total) by mouth daily. 90 tablet 0   No facility-administered medications prior to visit.     Per HPI unless specifically indicated in ROS section below Review of Systems  Constitutional:  Negative for activity change, appetite change, chills, fatigue, fever and unexpected weight change.  HENT:  Negative for hearing loss.   Eyes:  Negative for visual disturbance.  Respiratory:  Positive for chest tightness and shortness of breath (exertional). Negative for cough and wheezing.   Cardiovascular:  Negative for chest pain, palpitations and leg swelling.  Gastrointestinal:  Negative for abdominal distention, abdominal pain, blood in stool, constipation, diarrhea, nausea and vomiting.  Genitourinary:  Negative for difficulty urinating and hematuria.  Musculoskeletal:  Negative for arthralgias, myalgias and neck pain.  Skin:  Negative for rash.  Neurological:  Positive for dizziness and headaches (L temporal, chronic). Negative for seizures and syncope.  Hematological:  Negative for adenopathy. Does not bruise/bleed easily.  Psychiatric/Behavioral:  Negative for dysphoric mood. The patient is not nervous/anxious.    Objective:  BP 140/78    Pulse 74    Temp 97.9 F (36.6 C) (Temporal)    Ht 5' 8.5" (1.74 m)    Wt 261 lb 4 oz (118.5 kg)    SpO2 99%    BMI 39.15 kg/m   Wt  Readings from Last 3 Encounters:  08/07/21 261 lb 4 oz (118.5 kg)  07/31/21 250 lb (113.4 kg)  07/29/21 251 lb 5.2 oz (114 kg)      Physical Exam Vitals and nursing note reviewed.  Constitutional:      General: He is not in acute distress.    Appearance: Normal appearance. He is well-developed. He is not ill-appearing.  HENT:     Head: Normocephalic and atraumatic.     Right Ear: Hearing, tympanic membrane, ear canal and external ear normal.     Left Ear: Hearing, tympanic membrane, ear canal and external ear normal.  Eyes:     General: No scleral icterus.    Extraocular Movements: Extraocular movements intact.     Conjunctiva/sclera: Conjunctivae normal.     Pupils: Pupils are equal, round, and reactive to light.  Neck:     Thyroid: No thyroid mass or thyromegaly.     Vascular: No carotid bruit.  Cardiovascular:     Rate and Rhythm: Normal rate and regular rhythm.     Pulses: Normal pulses.          Radial pulses are 2+ on the right side and 2+ on the left side.     Heart sounds: Normal heart sounds. No murmur heard. Pulmonary:     Effort: Pulmonary effort is normal. No respiratory distress.     Breath sounds: Normal breath sounds. No wheezing, rhonchi or rales.  Abdominal:     General: Bowel sounds are normal. There is no distension.     Palpations: Abdomen is soft. There is no mass.     Tenderness: There is abdominal tenderness (mild) in the epigastric area. There is no guarding or rebound. Negative signs include Murphy's sign.     Hernia: No hernia is present.  Musculoskeletal:        General: Normal range of motion.     Cervical back: Normal range of motion and neck supple.     Right lower leg: No edema.     Left lower leg: No edema.  Lymphadenopathy:     Cervical: No cervical adenopathy.  Skin:    General: Skin is warm and dry.     Findings: Lesion present. No rash.     Comments: Poorly healing erosion with some scaling to R lateral distal forearm  Neurological:      General: No focal deficit present.     Mental Status: He is alert and oriented to person, place, and time.  Psychiatric:        Mood and Affect: Mood normal.        Behavior: Behavior normal.        Thought Content: Thought content normal.        Judgment: Judgment normal.      Results for orders placed or performed in visit on 08/07/21  Lipid panel  Result Value Ref Range   Cholesterol 182 0 - 200  mg/dL   Triglycerides 145.0 0.0 - 149.0 mg/dL   HDL 36.70 (L) >39.00 mg/dL   VLDL 29.0 0.0 - 40.0 mg/dL   LDL Cholesterol 117 (H) 0 - 99 mg/dL   Total CHOL/HDL Ratio 5    NonHDL 145.79   Comprehensive metabolic panel  Result Value Ref Range   Sodium 139 135 - 145 mEq/L   Potassium 4.6 3.5 - 5.1 mEq/L   Chloride 103 96 - 112 mEq/L   CO2 30 19 - 32 mEq/L   Glucose, Bld 86 70 - 99 mg/dL   BUN 13 6 - 23 mg/dL   Creatinine, Ser 0.69 0.40 - 1.50 mg/dL   Total Bilirubin 0.6 0.2 - 1.2 mg/dL   Alkaline Phosphatase 98 39 - 117 U/L   AST 31 0 - 37 U/L   ALT 60 (H) 0 - 53 U/L   Total Protein 6.4 6.0 - 8.3 g/dL   Albumin 4.1 3.5 - 5.2 g/dL   GFR 101.53 >60.00 mL/min   Calcium 9.6 8.4 - 10.5 mg/dL  PSA  Result Value Ref Range   PSA 0.21 0.10 - 4.00 ng/mL  TSH  Result Value Ref Range   TSH 2.46 0.35 - 5.50 uIU/mL  Hemoglobin A1c  Result Value Ref Range   Hgb A1c MFr Bld 6.2 4.6 - 6.5 %    Assessment & Plan:  This visit occurred during the SARS-CoV-2 public health emergency.  Safety protocols were in place, including screening questions prior to the visit, additional usage of staff PPE, and extensive cleaning of exam room while observing appropriate contact time as indicated for disinfecting solutions.   Problem List Items Addressed This Visit     Encounter for general adult medical examination with abnormal findings - Primary (Chronic)    Preventative protocols reviewed and updated unless pt declined. Discussed healthy diet and lifestyle.       GERD (gastroesophageal reflux  disease)    Chronic, stable on omeprazole 20mg  daily.       Relevant Medications   omeprazole (PRILOSEC) 20 MG capsule   CAD (coronary artery disease)    Recent ER eval with chest discomfort associated with lighteadedness and shortness of breath. Reviewed recent reassuring ER eval and heart catheterization. Appreciate cards care.       Relevant Medications   atorvastatin (LIPITOR) 40 MG tablet   Dyslipidemia    Chronic, continue atorvastatin. Update FLP. The 10-year ASCVD risk score (Arnett DK, et al., 2019) is: 10.6%   Values used to calculate the score:     Age: 79 years     Sex: Male     Is Non-Hispanic African American: No     Diabetic: No     Tobacco smoker: No     Systolic Blood Pressure: 761 mmHg     Is BP treated: No     HDL Cholesterol: 36.7 mg/dL     Total Cholesterol: 182 mg/dL       Relevant Medications   atorvastatin (LIPITOR) 40 MG tablet   Other Relevant Orders   Lipid panel (Completed)   Comprehensive metabolic panel (Completed)   Hypertension, essential    Chronic, adequate off chlorthalidone - bp med was recently stopped as it may have contributed to dizziness symptoms - will reassess off med.       Relevant Medications   atorvastatin (LIPITOR) 40 MG tablet   Severe obesity (BMI 35.0-39.9) with comorbidity ([place])    Continue to encourage healthy diet and lifestyle choices to affect sustainable weight  loss.       Alcohol use    Has cut down on alcohol intake.       Right groin pain    Ongoing intermittent symptoms s/p reassuring evaluation including CT, hip xray, and then groin ultrasound by surgery office. Ongoing, manageable. Anticipate MSK cause - he declines ortho eval at this time.       Prediabetes    Update A1c - last level was borderline prediabetes range.       Relevant Orders   Hemoglobin A1c (Completed)   Lightheadedness    Orthostatics negative in office today.  Discussed possible contributors including chlorthalidone and  gabspentin - he is now off both, recommended continue to monitor symptoms off antihypertensive.       Relevant Orders   TSH (Completed)   Skin lesion of right arm    Poorly healing wound present for 1+ month.  Will refer to derm for evaluation.       Relevant Orders   Ambulatory referral to Dermatology   Other Visit Diagnoses     Special screening for malignant neoplasms, colon       Relevant Orders   Ambulatory referral to Gastroenterology   PSA (Completed)   Special screening for malignant neoplasm of prostate            Meds ordered this encounter  Medications   atorvastatin (LIPITOR) 40 MG tablet    Sig: TAKE 1 TABLET(40 MG) BY MOUTH DAILY AT 6 PM    Dispense:  90 tablet    Refill:  3   omeprazole (PRILOSEC) 20 MG capsule    Sig: TAKE 1 CAPSULE(20 MG) BY MOUTH DAILY    Dispense:  90 capsule    Refill:  3   Orders Placed This Encounter  Procedures   Lipid panel   Comprehensive metabolic panel   PSA   TSH   Hemoglobin A1c   Ambulatory referral to Gastroenterology    Referral Priority:   Routine    Referral Type:   Consultation    Referral Reason:   Specialty Services Required    Number of Visits Requested:   1   Ambulatory referral to Dermatology    Referral Priority:   Routine    Referral Type:   Consultation    Referral Reason:   Specialty Services Required    Requested Specialty:   Dermatology    Number of Visits Requested:   1    Patient instructions: Orthostatic vital signs.  Consider shingrix vaccine series.  We will refer you to skin doctor.  For shortness of breath and lightheadedness, stay off blood pressure medicine, let me know if not improving over time off of this medicine for further evaluation.  Return as needed or in 1 year for next physical.   Follow up plan: Return in about 1 year (around 08/07/2022), or if symptoms worsen or fail to improve, for annual exam, prior fasting for blood work.  Ria Bush, MD

## 2021-08-07 NOTE — Assessment & Plan Note (Signed)
Orthostatics negative in office today.  Discussed possible contributors including chlorthalidone and gabspentin - he is now off both, recommended continue to monitor symptoms off antihypertensive.

## 2021-08-07 NOTE — Assessment & Plan Note (Signed)
Update A1c - last level was borderline prediabetes range.

## 2021-08-07 NOTE — Assessment & Plan Note (Signed)
Preventative protocols reviewed and updated unless pt declined. Discussed healthy diet and lifestyle.  

## 2021-08-07 NOTE — Assessment & Plan Note (Addendum)
Has cut down on alcohol intake.

## 2021-08-07 NOTE — Assessment & Plan Note (Signed)
Continue to encourage healthy diet and lifestyle choices to affect sustainable weight loss.  °

## 2021-08-07 NOTE — Assessment & Plan Note (Signed)
Chronic, adequate off chlorthalidone - bp med was recently stopped as it may have contributed to dizziness symptoms - will reassess off med.

## 2021-08-07 NOTE — Assessment & Plan Note (Signed)
Chronic, stable on omeprazole 20mg daily.  

## 2021-08-07 NOTE — Assessment & Plan Note (Signed)
Recent ER eval with chest discomfort associated with lighteadedness and shortness of breath. Reviewed recent reassuring ER eval and heart catheterization. Appreciate cards care.

## 2021-08-07 NOTE — Patient Instructions (Addendum)
Orthostatic vital signs.  Consider shingrix vaccine series.  We will refer you to skin doctor.  For shortness of breath and lightheadedness, stay off blood pressure medicine, let me know if not improving over time off of this medicine for further evaluation.  Return as needed or in 1 year for next physical.   Health Maintenance, Male Adopting a healthy lifestyle and getting preventive care are important in promoting health and wellness. Ask your health care provider about: The right schedule for you to have regular tests and exams. Things you can do on your own to prevent diseases and keep yourself healthy. What should I know about diet, weight, and exercise? Eat a healthy diet  Eat a diet that includes plenty of vegetables, fruits, low-fat dairy products, and lean protein. Do not eat a lot of foods that are high in solid fats, added sugars, or sodium. Maintain a healthy weight Body mass index (BMI) is a measurement that can be used to identify possible weight problems. It estimates body fat based on height and weight. Your health care provider can help determine your BMI and help you achieve or maintain a healthy weight. Get regular exercise Get regular exercise. This is one of the most important things you can do for your health. Most adults should: Exercise for at least 150 minutes each week. The exercise should increase your heart rate and make you sweat (moderate-intensity exercise). Do strengthening exercises at least twice a week. This is in addition to the moderate-intensity exercise. Spend less time sitting. Even light physical activity can be beneficial. Watch cholesterol and blood lipids Have your blood tested for lipids and cholesterol at 59 years of age, then have this test every 5 years. You may need to have your cholesterol levels checked more often if: Your lipid or cholesterol levels are high. You are older than 59 years of age. You are at high risk for heart  disease. What should I know about cancer screening? Many types of cancers can be detected early and may often be prevented. Depending on your health history and family history, you may need to have cancer screening at various ages. This may include screening for: Colorectal cancer. Prostate cancer. Skin cancer. Lung cancer. What should I know about heart disease, diabetes, and high blood pressure? Blood pressure and heart disease High blood pressure causes heart disease and increases the risk of stroke. This is more likely to develop in people who have high blood pressure readings or are overweight. Talk with your health care provider about your target blood pressure readings. Have your blood pressure checked: Every 3-5 years if you are 75-75 years of age. Every year if you are 31 years old or older. If you are between the ages of 58 and 58 and are a current or former smoker, ask your health care provider if you should have a one-time screening for abdominal aortic aneurysm (AAA). Diabetes Have regular diabetes screenings. This checks your fasting blood sugar level. Have the screening done: Once every three years after age 5 if you are at a normal weight and have a low risk for diabetes. More often and at a younger age if you are overweight or have a high risk for diabetes. What should I know about preventing infection? Hepatitis B If you have a higher risk for hepatitis B, you should be screened for this virus. Talk with your health care provider to find out if you are at risk for hepatitis B infection. Hepatitis C Blood testing  is recommended for: Everyone born from [birth year] through 1964. Anyone with known risk factors for hepatitis C. Sexually transmitted infections (STIs) You should be screened each year for STIs, including gonorrhea and chlamydia, if: You are sexually active and are younger than 59 years of age. You are older than 59 years of age and your health care provider tells you  that you are at risk for this type of infection. Your sexual activity has changed since you were last screened, and you are at increased risk for chlamydia or gonorrhea. Ask your health care provider if you are at risk. Ask your health care provider about whether you are at high risk for HIV. Your health care provider may recommend a prescription medicine to help prevent HIV infection. If you choose to take medicine to prevent HIV, you should first get tested for HIV. You should then be tested every 3 months for as long as you are taking the medicine. Follow these instructions at home: Alcohol use Do not drink alcohol if your health care provider tells you not to drink. If you drink alcohol: Limit how much you have to 0-2 drinks a day. Know how much alcohol is in your drink. In the [place] one drink equals one 12 oz bottle of beer (355 mL), one 5 oz glass of wine (148 mL), or one 1 oz glass of hard liquor (44 mL). Lifestyle Do not use any products that contain nicotine or tobacco. These products include cigarettes, chewing tobacco, and vaping devices, such as e-cigarettes. If you need help quitting, ask your health care provider. Do not use street drugs. Do not share needles. Ask your health care provider for help if you need support or information about quitting drugs. General instructions Schedule regular health, dental, and eye exams. Stay current with your vaccines. Tell your health care provider if: You often feel depressed. You have ever been abused or do not feel safe at home. Summary Adopting a healthy lifestyle and getting preventive care are important in promoting health and wellness. Follow your health care provider's instructions about healthy diet, exercising, and getting tested or screened for diseases. Follow your health care provider's instructions on monitoring your cholesterol and blood pressure. This information is not intended to replace advice given to you by your health  care provider. Make sure you discuss any questions you have with your health care provider. Document Revised: 01/01/2021 Document Reviewed: 01/01/2021 Elsevier Patient Education  [place].

## 2021-08-07 NOTE — Assessment & Plan Note (Signed)
Chronic, continue atorvastatin. Update FLP. The 10-year ASCVD risk score (Arnett DK, et al., 2019) is: 10.6%   Values used to calculate the score:     Age: 60 years     Sex: Male     Is Non-Hispanic African American: No     Diabetic: No     Tobacco smoker: No     Systolic Blood Pressure: 446 mmHg     Is BP treated: No     HDL Cholesterol: 36.7 mg/dL     Total Cholesterol: 182 mg/dL

## 2021-08-07 NOTE — Assessment & Plan Note (Signed)
Ongoing intermittent symptoms s/p reassuring evaluation including CT, hip xray, and then groin ultrasound by surgery office. Ongoing, manageable. Anticipate MSK cause - he declines ortho eval at this time.

## 2021-08-09 ENCOUNTER — Telehealth: Payer: Self-pay

## 2021-08-09 ENCOUNTER — Other Ambulatory Visit (INDEPENDENT_AMBULATORY_CARE_PROVIDER_SITE_OTHER): Payer: Self-pay

## 2021-08-09 DIAGNOSIS — Z86010 Personal history of colonic polyps: Secondary | ICD-10-CM

## 2021-08-09 MED ORDER — SUTAB 1479-225-188 MG PO TABS: 12.0000 | ORAL_TABLET | Freq: Once | ORAL | 0 refills | Status: AC

## 2021-08-09 NOTE — Telephone Encounter (Signed)
SCHEDULED FOR 09/21/2021

## 2021-08-09 NOTE — Progress Notes (Signed)
Gastroenterology Pre-Procedure Review  Request Date: 09/21/2021 Requesting Physician: Dr. [name]   PATIENT REVIEW QUESTIONS: The patient responded to the following health history questions as indicated:    1. Are you having any GI issues? no 2. Do you have a personal history of Polyps? yes (LAST COLONOSCOPY) 3. Do you have a family history of Colon Cancer or Polyps? no 4. Diabetes Mellitus? no 5. Joint replacements in the past 12 months?no 6. Major health problems in the past 3 months?no 7. Any artificial heart valves, MVP, or defibrillator?no    MEDICATIONS & ALLERGIES:    Patient reports the following regarding taking any anticoagulation/antiplatelet therapy:   Plavix, Coumadin, Eliquis, Xarelto, Lovenox, Pradaxa, Brilinta, or Effient? no Aspirin? no  Patient confirms/reports the following medications:  Current Outpatient Medications  Medication Sig Dispense Refill   atorvastatin (LIPITOR) 40 MG tablet TAKE 1 TABLET(40 MG) BY MOUTH DAILY AT 6 PM 90 tablet 3   omeprazole (PRILOSEC) 20 MG capsule TAKE 1 CAPSULE(20 MG) BY MOUTH DAILY 90 capsule 3   No current facility-administered medications for this visit.    Patient confirms/reports the following allergies:  No Known Allergies  No orders of the defined types were placed in this encounter.   AUTHORIZATION INFORMATION Primary Insurance: 1D#: Group #:  Secondary Insurance: 1D#: Group #:  SCHEDULE INFORMATION: Date: 09/21/2021 Time: Location: [place]

## 2021-08-22 ENCOUNTER — Encounter: Payer: Self-pay | Admitting: *Deleted

## 2021-09-13 ENCOUNTER — Telehealth: Payer: Self-pay

## 2021-09-13 NOTE — Telephone Encounter (Signed)
Patient wants to reschedule procedure for 09/21/21 and reschedule to 10/04/21. Called Endo and got patient moved

## 2021-10-04 ENCOUNTER — Telehealth: Payer: Self-pay | Admitting: Gastroenterology

## 2021-10-04 ENCOUNTER — Ambulatory Visit: Payer: 59 | Admitting: Anesthesiology

## 2021-10-04 ENCOUNTER — Encounter
Admission: RE | Disposition: A | Discharge status: Home / Self Care | Payer: Self-pay | Source: Home / Self Care | Attending: Gastroenterology

## 2021-10-04 ENCOUNTER — Ambulatory Visit
Admission: RE | Admit: 2021-10-04 | Discharge: 2021-10-04 | Disposition: A | Discharge status: Home / Self Care | Payer: 59 | Attending: Gastroenterology | Admitting: Gastroenterology

## 2021-10-04 DIAGNOSIS — I2510 Atherosclerotic heart disease of native coronary artery without angina pectoris: Secondary | ICD-10-CM | POA: Diagnosis not present

## 2021-10-04 DIAGNOSIS — Z1211 Encounter for screening for malignant neoplasm of colon: Secondary | ICD-10-CM | POA: Insufficient documentation

## 2021-10-04 DIAGNOSIS — E669 Obesity, unspecified: Secondary | ICD-10-CM | POA: Diagnosis not present

## 2021-10-04 DIAGNOSIS — K644 Residual hemorrhoidal skin tags: Secondary | ICD-10-CM | POA: Diagnosis not present

## 2021-10-04 DIAGNOSIS — K5730 Diverticulosis of large intestine without perforation or abscess without bleeding: Secondary | ICD-10-CM | POA: Insufficient documentation

## 2021-10-04 DIAGNOSIS — F17220 Nicotine dependence, chewing tobacco, uncomplicated: Secondary | ICD-10-CM | POA: Insufficient documentation

## 2021-10-04 DIAGNOSIS — K635 Polyp of colon: Secondary | ICD-10-CM | POA: Diagnosis not present

## 2021-10-04 DIAGNOSIS — D126 Benign neoplasm of colon, unspecified: Secondary | ICD-10-CM | POA: Diagnosis not present

## 2021-10-04 DIAGNOSIS — D127 Benign neoplasm of rectosigmoid junction: Secondary | ICD-10-CM | POA: Diagnosis not present

## 2021-10-04 DIAGNOSIS — Z860100 Personal history of colon polyps, unspecified: Secondary | ICD-10-CM

## 2021-10-04 DIAGNOSIS — Z6838 Body mass index (BMI) 38.0-38.9, adult: Secondary | ICD-10-CM | POA: Diagnosis not present

## 2021-10-04 DIAGNOSIS — D124 Benign neoplasm of descending colon: Secondary | ICD-10-CM | POA: Diagnosis not present

## 2021-10-04 DIAGNOSIS — I10 Essential (primary) hypertension: Secondary | ICD-10-CM | POA: Diagnosis not present

## 2021-10-04 HISTORY — PX: COLONOSCOPY WITH PROPOFOL: SHX5780

## 2021-10-04 SURGERY — COLONOSCOPY WITH PROPOFOL
Anesthesia: General

## 2021-10-04 MED ORDER — STERILE WATER FOR IRRIGATION IR SOLN
Status: DC | PRN
  Administered 2021-10-04: 120 mL
  Administered 2021-10-04: 60 mL

## 2021-10-04 MED ORDER — LIDOCAINE HCL (CARDIAC) PF 100 MG/5ML IV SOSY
PREFILLED_SYRINGE | INTRAVENOUS | Status: DC | PRN
Start: 2021-10-04 — End: 2021-10-04
  Administered 2021-10-04: 50 mg via INTRAVENOUS

## 2021-10-04 MED ORDER — PROPOFOL 10 MG/ML IV BOLUS
INTRAVENOUS | Status: DC | PRN
Start: 2021-10-04 — End: 2021-10-04
  Administered 2021-10-04: 50 mg via INTRAVENOUS
  Administered 2021-10-04: 20 mg via INTRAVENOUS

## 2021-10-04 MED ORDER — PROPOFOL 500 MG/50ML IV EMUL
INTRAVENOUS | Status: DC | PRN
  Administered 2021-10-04: 125 ug/kg/min via INTRAVENOUS

## 2021-10-04 MED ORDER — DEXMEDETOMIDINE (PRECEDEX) IN NS 20 MCG/5ML (4 MCG/ML) IV SYRINGE
PREFILLED_SYRINGE | INTRAVENOUS | Status: DC | PRN
  Administered 2021-10-04: 8 ug via INTRAVENOUS
  Administered 2021-10-04: 4 ug via INTRAVENOUS
  Administered 2021-10-04: 8 ug via INTRAVENOUS

## 2021-10-04 MED ORDER — SODIUM CHLORIDE 0.9 % IV SOLN
INTRAVENOUS | Status: DC
  Administered 2021-10-04: 20 mL/h via INTRAVENOUS

## 2021-10-04 NOTE — Op Note (Signed)
[facility] Gastroenterology Patient Name: [name] [name] Procedure Date: 10/04/2021 9:47 AM MRN: [id number] Account #: [id number] Date of Birth: [birth year] Admit Type: Outpatient Age: 61 Room: [facility] ENDO ROOM 4 Gender: Male Note Status: Finalized Instrument Name: Park Meo [id number] Procedure:             Colonoscopy Indications:           High risk colon cancer surveillance: Personal history                         of colonic polyps, Last colonoscopy 10 years ago Providers:             [name] MD, MD Referring MD:          Ria Bush (Referring MD) Medicines:             General Anesthesia Complications:         No immediate complications. Estimated blood loss: None. Procedure:             Pre-Anesthesia Assessment:                        - Prior to the procedure, a History and Physical was                         performed, and patient medications and allergies were                         reviewed. The patient is competent. The risks and                         benefits of the procedure and the sedation options and                         risks were discussed with the patient. All questions                         were answered and informed consent was obtained.                         Patient identification and proposed procedure were                         verified by the physician, the nurse, the                         anesthesiologist, the anesthetist and the technician                         in the pre-procedure area in the procedure room in the                         endoscopy suite. Mental Status Examination: alert and                         oriented. Airway Examination: normal oropharyngeal                         airway and neck mobility. Respiratory Examination:  clear to auscultation. CV Examination: normal.                         Prophylactic Antibiotics: The patient does not require                          prophylactic antibiotics. Prior Anticoagulants: The                         patient has taken no previous anticoagulant or                         antiplatelet agents. ASA Grade Assessment: III - A                         patient with severe systemic disease. After reviewing                         the risks and benefits, the patient was deemed in                         satisfactory condition to undergo the procedure. The                         anesthesia plan was to use monitored anesthesia care                         (MAC). Immediately prior to administration of                         medications, the patient was re-assessed for adequacy                         to receive sedatives. The heart rate, respiratory                         rate, oxygen saturations, blood pressure, adequacy of                         pulmonary ventilation, and response to care were                         monitored throughout the procedure. The physical                         status of the patient was re-assessed after the                         procedure.                        After obtaining informed consent, the colonoscope was                         passed under direct vision. Throughout the procedure,                         the patient's blood pressure, pulse, and oxygen  saturations were monitored continuously. The                         Colonoscope was introduced through the anus and                         advanced to the the terminal ileum, with                         identification of the appendiceal orifice and IC                         valve. The colonoscopy was performed without                         difficulty. The patient tolerated the procedure well.                         The quality of the bowel preparation was evaluated                         using the BBPS [facility] Bowel Preparation Scale) with                         scores of: Right Colon = 3,  Transverse Colon = 3 and                         Left Colon = 3 (entire mucosa seen well with no                         residual staining, small fragments of stool or opaque                         liquid). The total BBPS score equals 9. Findings:      The perianal and digital rectal examinations were normal. Pertinent       negatives include normal sphincter tone and no palpable rectal lesions.      Hemorrhoids were found on perianal exam.      The terminal ileum appeared normal.      A diminutive polyp was found in the ascending colon. The polyp was       sessile. The polyp was removed with a cold biopsy forceps. Resection and       retrieval were complete.      A 4 mm polyp was found in the descending colon. The polyp was sessile.       The polyp was removed with a cold snare. Resection and retrieval were       complete. Estimated blood loss: none.      A 10 mm polyp was found in the recto-sigmoid colon. The polyp was       pedunculated. The polyp was removed with a hot snare. Resection and       retrieval were complete. Estimated blood loss: none.      A 3 mm polyp was found in the recto-sigmoid colon. The polyp was       sessile. The polyp was removed with a cold snare. Resection and       retrieval were complete. Estimated blood loss: none.  Multiple diverticula were found in the recto-sigmoid colon, sigmoid       colon and descending colon.      Non-bleeding external hemorrhoids were found during retroflexion. The       hemorrhoids were medium-sized. Impression:            - Hemorrhoids found on perianal exam.                        - The examined portion of the ileum was normal.                        - One diminutive polyp in the ascending colon, removed                         with a cold biopsy forceps. Resected and retrieved.                        - One 4 mm polyp in the descending colon, removed with                         a cold snare. Resected and retrieved.                         - One 10 mm polyp at the recto-sigmoid colon, removed                         with a hot snare. Resected and retrieved.                        - One 3 mm polyp at the recto-sigmoid colon, removed                         with a cold snare. Resected and retrieved.                        - Diverticulosis in the recto-sigmoid colon, in the                         sigmoid colon and in the descending colon.                        - Non-bleeding external hemorrhoids. Recommendation:        - Discharge patient to home (with escort).                        - Resume previous diet today.                        - Continue present medications.                        - Await pathology results.                        - Repeat colonoscopy in 3 - 5 years for surveillance                         based on pathology results. Procedure Code(s):     ---  Professional ---                        [phone], Colonoscopy, flexible; with removal of                         tumor(s), polyp(s), or other lesion(s) by snare                         technique                        45380, 59, Colonoscopy, flexible; with biopsy, single                         or multiple Diagnosis Code(s):     --- Professional ---                        K64.4, Residual hemorrhoidal skin tags                        Z86.010, Personal history of colonic polyps                        K63.5, Polyp of colon                        K57.30, Diverticulosis of large intestine without                         perforation or abscess without bleeding CPT copyright 2019 American Medical Association. All rights reserved. The codes documented in this report are preliminary and upon coder review may  be revised to meet current compliance requirements. Dr. Ulyess Mort [name] MD, MD 10/04/2021 10:19:49 AM This report has been signed electronically. Number of Addenda: 0 Note Initiated On: 10/04/2021 9:47 AM Scope Withdrawal Time: 0  hours 15 minutes 20 seconds  Total Procedure Duration: 0 hours 17 minutes 7 seconds  Estimated Blood Loss:  Estimated blood loss: none.      [facility]

## 2021-10-04 NOTE — Telephone Encounter (Signed)
Patient had colonoscopy today has had rectal bleeding with bowel movements. He states this fills up the toilet. He states this has happen 3 times so far but the last time he said it was starting to clot some

## 2021-10-04 NOTE — H&P (Signed)
[name] Darby, MD [address]  [place]  [place], [place] [address]  Main: [phone]  Fax: [phone] Pager: [phone]  Primary Care Physician:  Ria Bush, MD Primary Gastroenterologist:  Dr. Cephas [name]  Pre-Procedure History & Physical: HPI:  [name] [name] is a 61 y.o. male is here for an colonoscopy.   Past Medical History:  Diagnosis Date   Alcohol use    regularly 3 beers/day   CAD (coronary artery disease)    mild   Dyslipidemia    GERD (gastroesophageal reflux disease)    with esophagitis   History of Helicobacter pylori infection    Hypertension, essential    Obesity, Class I, BMI 30-34.9 01/19/2016    Past Surgical History:  Procedure Laterality Date   ANTERIOR CERVICAL DECOMP/DISCECTOMY FUSION  2012   C4-5 ([place])   ANTERIOR CERVICAL DECOMP/DISCECTOMY FUSION N/A 03/12/2018   ACDF C4-6, EXPLORATION OF FUSION C6-7, REMOVAL OF HARDWARE;  [name], [name], MD)   ARTERY BIOPSY Left 10/20/2019   TEMPORAL ARTERY BIOPSY - Schnier, Dolores Lory, MD   CARDIAC CATHETERIZATION  2013   no significant CAD, nl LVEF 60% [name])   COLONOSCOPY  2012   WNL per PCP report   CT CTA CORONARY W/CA SCORE W/CM &/OR WO/CM  2015   minor luminal irregularities, Ca score = 90 [name])   KNEE ARTHROSCOPY Bilateral    LEFT HEART CATH AND CORONARY ANGIOGRAPHY N/A 07/31/2021   Procedure: LEFT HEART CATH AND CORONARY ANGIOGRAPHY;  Surgeon: [name], MD;  Location: [facility] [address] CV [facility];  Service: Cardiovascular;  Laterality: N/A;   ROTATOR CUFF REPAIR Right 2019   SCLEROTHERAPY     legs    Prior to Admission medications   Medication Sig Start Date End Date Taking? Authorizing Provider  atorvastatin (LIPITOR) 40 MG tablet TAKE 1 TABLET(40 MG) BY MOUTH DAILY AT 6 PM 08/07/21   Ria Bush, MD  omeprazole (PRILOSEC) 20 MG capsule TAKE 1 CAPSULE(20 MG) BY MOUTH DAILY 08/07/21   Ria Bush, MD    Allergies as of 08/09/2021   (No Known Allergies)     Family History  Problem Relation Age of Onset   CAD Father 84       MI   Hyperlipidemia Father    Diabetes Paternal Grandfather    Cancer Mother 75       deceased from bone cancer   Stroke Neg Hx     Social History   Socioeconomic History   Marital status: Married    Spouse name: Not on file   Number of children: Not on file   Years of education: Not on file   Highest education level: Not on file  Occupational History   Not on file  Tobacco Use   Smoking status: Never   Smokeless tobacco: Current    Types: Chew  Vaping Use   Vaping Use: Never used  Substance and Sexual Activity   Alcohol use: Yes    Alcohol/week: 0.0 standard drinks    Comment: 8 oz whiskey a night   Drug use: No   Sexual activity: Not on file  Other Topics Concern   Not on file  Social History Narrative   Lives with wife, 1 dog   Edu: HS   Occ: maintenance   Activity: active at work   Diet: some water, fruits daily, beer   Social Determinants of Health   Financial Resource Strain: Not on file  Food Insecurity: Not on file  Transportation Needs:  Not on file  Physical Activity: Not on file  Stress: Not on file  Social Connections: Not on file  Intimate Partner Violence: Not on file    Review of Systems: See HPI, otherwise negative ROS  Physical Exam: BP (!) 145/95    Pulse 68    Temp (!) 96.4 F (35.8 C) (Temporal)    Resp 20    Ht 5\' 8"  (1.727 m)    Wt 113.4 kg    SpO2 99%    BMI 38.01 kg/m  General:   Alert,  pleasant and cooperative in NAD Head:  Normocephalic and atraumatic. Neck:  Supple; no masses or thyromegaly. Lungs:  Clear throughout to auscultation.    Heart:  Regular rate and rhythm. Abdomen:  Soft, nontender and nondistended. Normal bowel sounds, without guarding, and without rebound.   Neurologic:  Alert and  oriented x4;  grossly normal neurologically.  Impression/Plan: [name] [name] is here for an colonoscopy to be performed for h/o colon polyps  Risks,  benefits, limitations, and alternatives regarding  colonoscopy have been reviewed with the patient.  Questions have been answered.  All parties agreeable.   Sherri Sear, MD  10/04/2021, 9:50 AM

## 2021-10-04 NOTE — Anesthesia Preprocedure Evaluation (Signed)
Anesthesia Evaluation  Patient identified by MRN, date of birth, ID band Patient awake    Reviewed: Allergy & Precautions, NPO status , [name] Chart, lab work & pertinent test results  History of Anesthesia Complications Negative for: history of anesthetic complications  Airway Mallampati: III  TM Distance: <3 FB Neck ROM: full    Dental  (+) Chipped   Pulmonary neg shortness of breath,    Pulmonary exam normal        Cardiovascular Exercise Tolerance: Good hypertension, (-) angina+ CAD  Normal cardiovascular exam+ dysrhythmias      Neuro/Psych  Headaches,  Neuromuscular disease negative psych ROS   GI/Hepatic Neg liver ROS, GERD  Controlled,  Endo/Other  negative endocrine ROS  Renal/GU negative Renal ROS  negative genitourinary   Musculoskeletal  (+) Arthritis ,   Abdominal   Peds  Hematology negative hematology ROS (+)   Anesthesia Other Findings Past Medical History: No date: Alcohol use     Comment:  regularly 3 beers/day No date: CAD (coronary artery disease)     Comment:  mild No date: Dyslipidemia No date: GERD (gastroesophageal reflux disease)     Comment:  with esophagitis No date: History of Helicobacter pylori infection No date: Hypertension, essential 01/19/2016: Obesity, Class I, BMI 30-34.9  Past Surgical History: 2012: ANTERIOR CERVICAL DECOMP/DISCECTOMY FUSION     Comment:  C4-5 ([place]) 03/12/2018: ANTERIOR CERVICAL DECOMP/DISCECTOMY FUSION; N/A     Comment:  ACDF C4-6, EXPLORATION OF FUSION C6-7, REMOVAL OF               HARDWARE;  [name], Duane Lope, MD) 10/20/2019: ARTERY BIOPSY; Left     Comment:  TEMPORAL ARTERY BIOPSY - Schnier, Dolores Lory, MD 2013: CARDIAC CATHETERIZATION     Comment:  no significant CAD, nl LVEF 60% [name]) 2012: COLONOSCOPY     Comment:  WNL per PCP report 2015: CT CTA CORONARY W/CA SCORE W/CM &/OR WO/CM     Comment:  minor luminal irregularities, Ca score = 90  [name]) No date: KNEE ARTHROSCOPY; Bilateral 07/31/2021: LEFT HEART CATH AND CORONARY ANGIOGRAPHY; N/A     Comment:  Procedure: LEFT HEART CATH AND CORONARY ANGIOGRAPHY;                Surgeon: [name], MD;  Location: [facility]               CV LAB;  Service: Cardiovascular;  Laterality: N/A; 2019: ROTATOR CUFF REPAIR; Right No date: SCLEROTHERAPY     Comment:  legs  BMI    Body Mass Index: 38.01 kg/m      Reproductive/Obstetrics negative OB ROS                             Anesthesia Physical Anesthesia Plan  ASA: 3  Anesthesia Plan: General   Post-op Pain Management:    Induction: Intravenous  PONV Risk Score and Plan: Propofol infusion and TIVA  Airway Management Planned: Natural Airway and Nasal Cannula  Additional Equipment:   Intra-op Plan:   Post-operative Plan:   Informed Consent: I have reviewed the patients History and Physical, chart, labs and discussed the procedure including the risks, benefits and alternatives for the proposed anesthesia with the patient or authorized representative who has indicated his/her understanding and acceptance.     Dental Advisory Given  Plan Discussed with: Anesthesiologist, CRNA and Surgeon  Anesthesia Plan Comments: (Patient consented for risks of anesthesia including but not limited  to:  - adverse reactions to medications - risk of airway placement if required - damage to eyes, teeth, lips or other oral mucosa - nerve damage due to positioning  - sore throat or hoarseness - Damage to heart, brain, nerves, lungs, other parts of body or loss of life  Patient voiced understanding.)        Anesthesia Quick Evaluation

## 2021-10-04 NOTE — Transfer of Care (Signed)
Immediate Anesthesia Transfer of Care Note  Patient: [name] [name]  Procedure(s) Performed: COLONOSCOPY WITH PROPOFOL  Patient Location: PACU and Endoscopy Unit  Anesthesia Type:General  Level of Consciousness: drowsy and patient cooperative  Airway & Oxygen Therapy: Patient Spontanous Breathing  Post-op Assessment: Report given to RN and Post -op Vital signs reviewed and stable  Post vital signs: Reviewed and stable  Last Vitals:  Vitals Value Taken Time  BP 117/76 10/04/21 1021  Temp    Pulse 72 10/04/21 1021  Resp 24 10/04/21 1021  SpO2 97 % 10/04/21 1021  Vitals shown include unvalidated device data.  Last Pain:  Vitals:   10/04/21 0929  TempSrc: Temporal  PainSc: 0-No pain         Complications: No notable events documented.

## 2021-10-04 NOTE — Anesthesia Procedure Notes (Signed)
Procedure Name: MAC Date/Time: 10/04/2021 10:00 AM Performed by: Jerrye Noble, CRNA Pre-anesthesia Checklist: Emergency Drugs available, Patient identified, Suction available and Patient being monitored Patient Re-evaluated:Patient Re-evaluated prior to induction Oxygen Delivery Method: Nasal cannula

## 2021-10-04 NOTE — Anesthesia Postprocedure Evaluation (Signed)
Anesthesia Post Note  Patient: [name]  Procedure(s) Performed: COLONOSCOPY WITH PROPOFOL  Patient location during evaluation: Endoscopy Anesthesia Type: General Level of consciousness: awake and alert Pain management: pain level controlled Vital Signs Assessment: post-procedure vital signs reviewed and stable Respiratory status: spontaneous breathing, nonlabored ventilation, respiratory function stable and patient connected to nasal cannula oxygen Cardiovascular status: blood pressure returned to baseline and stable Postop Assessment: no apparent nausea or vomiting Anesthetic complications: no   No notable events documented.   Last Vitals:  Vitals:   10/04/21 1031 10/04/21 1041  BP: 132/81 (!) 141/91  Pulse: 66 62  Resp: 16 16  Temp:    SpO2: 95% 97%    Last Pain:  Vitals:   10/04/21 1041  TempSrc:   PainSc: 0-No pain                 Precious Haws Bryanah Sidell

## 2021-10-04 NOTE — Telephone Encounter (Signed)
Returned patient's call regarding rectal bleeding.  He had a pedunculated polyp in the sigmoid colon that has been removed with hot snare today.  Patient reports that he had 4 episodes of rectal bleeding which was about a tablespoon size and he thinks it is clotting of.  Advised patient to stay on clear liquid rest of the day and n.p.o. effective 5 AM tomorrow.  He will call the Endo unit around 8 AM tomorrow to update me if he has ongoing rectal bleeding.  In that case, I will perform flexible sigmoidoscopy without sedation to ensure if he is bleeding from post polypectomy site in the sigmoid colon and treated accordingly.  Patient expressed understanding of the plan  Cephas Darby, MD [facility], [place]  [place]  [place], [place] [address]  Main: [phone]  Fax: [phone] Pager: [phone]

## 2021-10-04 NOTE — Telephone Encounter (Signed)
Pt is having rectal bleeding  since  colonoscopy procedure.

## 2021-10-05 ENCOUNTER — Encounter: Payer: Self-pay | Admitting: Gastroenterology

## 2021-10-05 ENCOUNTER — Observation Stay
Admission: EM | Admit: 2021-10-05 | Discharge: 2021-10-06 | Disposition: A | Discharge status: Home / Self Care | Payer: 59 | Attending: Internal Medicine | Admitting: Internal Medicine

## 2021-10-05 ENCOUNTER — Encounter
Admission: EM | Disposition: A | Discharge status: Home / Self Care | Payer: Self-pay | Source: Home / Self Care | Attending: Emergency Medicine

## 2021-10-05 ENCOUNTER — Encounter: Payer: Self-pay | Admitting: Registered Nurse

## 2021-10-05 ENCOUNTER — Other Ambulatory Visit: Payer: Self-pay

## 2021-10-05 ENCOUNTER — Inpatient Hospital Stay: Payer: 59

## 2021-10-05 DIAGNOSIS — K635 Polyp of colon: Secondary | ICD-10-CM | POA: Diagnosis not present

## 2021-10-05 DIAGNOSIS — E785 Hyperlipidemia, unspecified: Secondary | ICD-10-CM | POA: Diagnosis present

## 2021-10-05 DIAGNOSIS — K922 Gastrointestinal hemorrhage, unspecified: Secondary | ICD-10-CM

## 2021-10-05 DIAGNOSIS — K921 Melena: Secondary | ICD-10-CM | POA: Insufficient documentation

## 2021-10-05 DIAGNOSIS — F1010 Alcohol abuse, uncomplicated: Secondary | ICD-10-CM | POA: Diagnosis present

## 2021-10-05 DIAGNOSIS — K91840 Postprocedural hemorrhage and hematoma of a digestive system organ or structure following a digestive system procedure: Principal | ICD-10-CM | POA: Insufficient documentation

## 2021-10-05 DIAGNOSIS — K5730 Diverticulosis of large intestine without perforation or abscess without bleeding: Secondary | ICD-10-CM | POA: Diagnosis not present

## 2021-10-05 DIAGNOSIS — I10 Essential (primary) hypertension: Secondary | ICD-10-CM | POA: Diagnosis present

## 2021-10-05 DIAGNOSIS — Z79899 Other long term (current) drug therapy: Secondary | ICD-10-CM | POA: Insufficient documentation

## 2021-10-05 DIAGNOSIS — Z20822 Contact with and (suspected) exposure to covid-19: Secondary | ICD-10-CM | POA: Diagnosis not present

## 2021-10-05 DIAGNOSIS — K640 First degree hemorrhoids: Secondary | ICD-10-CM | POA: Insufficient documentation

## 2021-10-05 DIAGNOSIS — K219 Gastro-esophageal reflux disease without esophagitis: Secondary | ICD-10-CM | POA: Diagnosis not present

## 2021-10-05 DIAGNOSIS — I2510 Atherosclerotic heart disease of native coronary artery without angina pectoris: Secondary | ICD-10-CM | POA: Diagnosis not present

## 2021-10-05 DIAGNOSIS — K625 Hemorrhage of anus and rectum: Secondary | ICD-10-CM | POA: Diagnosis present

## 2021-10-05 HISTORY — PX: FLEXIBLE SIGMOIDOSCOPY: SHX5431

## 2021-10-05 LAB — CBC
HCT: 38.8 % — ABNORMAL LOW (ref 39.0–52.0)
HCT: 34.9 % — ABNORMAL LOW (ref 39.0–52.0)
HCT: 33.3 % — ABNORMAL LOW (ref 39.0–52.0)
Hemoglobin: 13.2 g/dL (ref 13.0–17.0)
Hemoglobin: 11.8 g/dL — ABNORMAL LOW (ref 13.0–17.0)
Hemoglobin: 11.4 g/dL — ABNORMAL LOW (ref 13.0–17.0)
MCH: 31.6 pg (ref 26.0–34.0)
MCH: 31.6 pg (ref 26.0–34.0)
MCH: 31.8 pg (ref 26.0–34.0)
MCHC: 34 g/dL (ref 30.0–36.0)
MCHC: 33.8 g/dL (ref 30.0–36.0)
MCHC: 34.2 g/dL (ref 30.0–36.0)
MCV: 92.8 fL (ref 80.0–100.0)
MCV: 93.3 fL (ref 80.0–100.0)
MCV: 93 fL (ref 80.0–100.0)
Platelets: 222 10*3/uL (ref 150–400)
Platelets: 203 10*3/uL (ref 150–400)
Platelets: 197 10*3/uL (ref 150–400)
RBC: 4.18 MIL/uL — ABNORMAL LOW (ref 4.22–5.81)
RBC: 3.74 MIL/uL — ABNORMAL LOW (ref 4.22–5.81)
RBC: 3.58 MIL/uL — ABNORMAL LOW (ref 4.22–5.81)
RDW: 12.8 % (ref 11.5–15.5)
RDW: 12.5 % (ref 11.5–15.5)
RDW: 12.5 % (ref 11.5–15.5)
WBC: 6.1 10*3/uL (ref 4.0–10.5)
WBC: 7 10*3/uL (ref 4.0–10.5)
WBC: 7.8 10*3/uL (ref 4.0–10.5)
nRBC: 0 % (ref 0.0–0.2)
nRBC: 0 % (ref 0.0–0.2)
nRBC: 0 % (ref 0.0–0.2)

## 2021-10-05 LAB — COMPREHENSIVE METABOLIC PANEL
ALT: 54 U/L — ABNORMAL HIGH (ref 0–44)
AST: 34 U/L (ref 15–41)
Albumin: 3.8 g/dL (ref 3.5–5.0)
Alkaline Phosphatase: 98 U/L (ref 38–126)
Anion gap: 7 (ref 5–15)
BUN: 14 mg/dL (ref 6–20)
CO2: 25 mmol/L (ref 22–32)
Calcium: 8.6 mg/dL — ABNORMAL LOW (ref 8.9–10.3)
Chloride: 103 mmol/L (ref 98–111)
Creatinine, Ser: 0.63 mg/dL (ref 0.61–1.24)
GFR, Estimated: >60 mL/min (ref >60)
Glucose, Bld: 121 mg/dL — ABNORMAL HIGH (ref 70–99)
Potassium: 4 mmol/L (ref 3.5–5.1)
Sodium: 135 mmol/L (ref 135–145)
Total Bilirubin: 0.7 mg/dL (ref 0.3–1.2)
Total Protein: 6.6 g/dL (ref 6.5–8.1)

## 2021-10-05 LAB — PROTIME-INR
INR: 1 (ref 0.8–1.2)
Prothrombin Time: 13.5 seconds (ref 11.4–15.2)

## 2021-10-05 LAB — IRON AND TIBC
Iron: 82 ug/dL (ref 45–182)
Saturation Ratios: 29 % (ref 17.9–39.5)
TIBC: 281 ug/dL (ref 250–450)
UIBC: 199 ug/dL

## 2021-10-05 LAB — TYPE AND SCREEN
ABO/RH(D): A POS
Antibody Screen: NEGATIVE

## 2021-10-05 LAB — HIV ANTIBODY (ROUTINE TESTING W REFLEX): HIV Screen 4th Generation wRfx: NONREACTIVE

## 2021-10-05 LAB — FOLATE: Folate: 16.6 ng/mL (ref >5.9)

## 2021-10-05 LAB — SURGICAL PATHOLOGY

## 2021-10-05 LAB — RESP PANEL BY RT-PCR (FLU A&B, COVID) ARPGX2
Influenza A by PCR: NEGATIVE
Influenza B by PCR: NEGATIVE
SARS Coronavirus 2 by RT PCR: NEGATIVE

## 2021-10-05 LAB — FERRITIN: Ferritin: 97 ng/mL (ref 24–336)

## 2021-10-05 LAB — APTT: aPTT: 29 seconds (ref 24–36)

## 2021-10-05 SURGERY — SIGMOIDOSCOPY, FLEXIBLE

## 2021-10-05 MED ORDER — ONDANSETRON HCL 4 MG/2ML IJ SOLN: 4.0000 mg | Freq: Three times a day (TID) | INTRAMUSCULAR | Status: DC | PRN

## 2021-10-05 MED ORDER — LORAZEPAM 2 MG/ML IJ SOLN: 0.0000 mg | Freq: Four times a day (QID) | INTRAMUSCULAR | Status: DC

## 2021-10-05 MED ORDER — TECHNETIUM TC 99M-LABELED RED BLOOD CELLS IV KIT
20.0000 | PACK | Freq: Once | INTRAVENOUS | Status: AC | PRN
  Administered 2021-10-05: 19.3 via INTRAVENOUS

## 2021-10-05 MED ORDER — LORAZEPAM 1 MG PO TABS: 1.0000 mg | ORAL_TABLET | ORAL | Status: DC | PRN

## 2021-10-05 MED ORDER — ACETAMINOPHEN 325 MG PO TABS: 650.0000 mg | ORAL_TABLET | Freq: Four times a day (QID) | ORAL | Status: DC | PRN

## 2021-10-05 MED ORDER — SODIUM CHLORIDE 0.9 % IV SOLN
INTRAVENOUS | Status: DC
  Administered 2021-10-05: 10 mL via INTRAVENOUS

## 2021-10-05 MED ORDER — FOLIC ACID 1 MG PO TABS
1.0000 mg | ORAL_TABLET | Freq: Every day | ORAL | Status: DC
  Administered 2021-10-05: 1 mg via ORAL
  Filled 2021-10-05: qty 1

## 2021-10-05 MED ORDER — LACTATED RINGERS IV BOLUS
1000.0000 mL | Freq: Once | INTRAVENOUS | Status: AC
  Administered 2021-10-05: 1000 mL via INTRAVENOUS

## 2021-10-05 MED ORDER — ATORVASTATIN CALCIUM 20 MG PO TABS
40.0000 mg | ORAL_TABLET | Freq: Every evening | ORAL | Status: DC
  Administered 2021-10-05: 40 mg via ORAL
  Filled 2021-10-05: qty 2

## 2021-10-05 MED ORDER — ADULT MULTIVITAMIN W/MINERALS CH
1.0000 | ORAL_TABLET | Freq: Every day | ORAL | Status: DC
  Administered 2021-10-05: 1 via ORAL
  Filled 2021-10-05: qty 1

## 2021-10-05 MED ORDER — LORAZEPAM 2 MG/ML IJ SOLN: 0.0000 mg | Freq: Two times a day (BID) | INTRAMUSCULAR | Status: DC

## 2021-10-05 MED ORDER — SODIUM CHLORIDE 0.9 % IV SOLN: INTRAVENOUS | Status: DC

## 2021-10-05 MED ORDER — HYDRALAZINE HCL 20 MG/ML IJ SOLN: 5.0000 mg | INTRAMUSCULAR | Status: DC | PRN

## 2021-10-05 MED ORDER — LORAZEPAM 2 MG/ML IJ SOLN: 1.0000 mg | INTRAMUSCULAR | Status: DC | PRN

## 2021-10-05 MED ORDER — THIAMINE HCL 100 MG/ML IJ SOLN: 100.0000 mg | Freq: Every day | INTRAMUSCULAR | Status: DC

## 2021-10-05 MED ORDER — PEG 3350-KCL-NA BICARB-NACL 420 G PO SOLR
4000.0000 mL | Freq: Once | ORAL | Status: AC
  Administered 2021-10-05: 4000 mL via ORAL
  Filled 2021-10-05: qty 4000

## 2021-10-05 MED ORDER — THIAMINE HCL 100 MG PO TABS
100.0000 mg | ORAL_TABLET | Freq: Every day | ORAL | Status: DC
  Administered 2021-10-05: 100 mg via ORAL
  Filled 2021-10-05: qty 1

## 2021-10-05 MED ORDER — PANTOPRAZOLE SODIUM 40 MG PO TBEC
40.0000 mg | DELAYED_RELEASE_TABLET | Freq: Every evening | ORAL | Status: DC
  Administered 2021-10-05: 40 mg via ORAL
  Filled 2021-10-05: qty 1

## 2021-10-05 NOTE — ED Provider Notes (Addendum)
[facility] Provider Note    Event Date/Time   First MD Initiated Contact with Patient 10/05/21 [phone]     (approximate)   History   Chief Complaint Rectal Bleeding   HPI  [name] [name] is a 61 y.o. male with past medical history of hypertension, hyperlipidemia, CAD, GERD, and alcohol abuse who presents to the ED complaining of bloody stools.  Patient reports that he underwent colonoscopy with removal of multiple polyps yesterday, after he returned home began having bloody bowel movements yesterday evening.  He states he has had about 10 bloody bowel movements over the course of last night into this morning.  He states he is only passing a small amount of stool, is primarily passing blood.  He has begun to feel lightheaded and dizzy, ended up falling off of the toilet after one of these bowel movements.  He denies hitting his head or losing consciousness, denies any injuries from the fall.  He has not had any chest pain or shortness of breath, denies requiring transfusion in the past.  He does not take any blood thinners.  He spoke with the GI office earlier today, who recommended he come to the ED for further evaluation.      Physical Exam   Triage Vital Signs: ED Triage Vitals [10/05/21 0825]  Enc Vitals Group     BP 107/73     Pulse Rate 97     Resp 18     Temp 97.6 F (36.4 C)     Temp Source Oral     SpO2 99 %     Weight 250 lb (113.4 kg)     Height 5\' 8"  (1.727 m)     Head Circumference      Peak Flow      Pain Score 4     Pain Loc      Pain Edu?      Excl. in [place]?     Most recent vital signs: Vitals:   10/05/21 0825  BP: 107/73  Pulse: 97  Resp: 18  Temp: 97.6 F (36.4 C)  SpO2: 99%    Constitutional: Alert and oriented. Eyes: Conjunctivae are normal. Head: Atraumatic. Nose: No congestion/rhinnorhea. Mouth/Throat: Mucous membranes are moist. Cardiovascular: Normal rate, regular rhythm. Grossly normal heart sounds.  2+ radial  pulses bilaterally. Respiratory: Normal respiratory effort.  No retractions. Lungs CTAB. Gastrointestinal: Soft and nontender. No distention. Musculoskeletal: No lower extremity tenderness nor edema.  Neurologic:  Normal speech and language. No gross focal neurologic deficits are appreciated.    ED Results / Procedures / Treatments   Labs (all labs ordered are listed, but only abnormal results are displayed) Labs Reviewed  COMPREHENSIVE METABOLIC PANEL - Abnormal; Notable for the following components:      Result Value   Glucose, Bld 121 (*)    Calcium 8.6 (*)    ALT 54 (*)    All other components within normal limits  CBC - Abnormal; Notable for the following components:   RBC 4.18 (*)    HCT 38.8 (*)    All other components within normal limits  RESP PANEL BY RT-PCR (FLU A&B, COVID) ARPGX2  TYPE AND SCREEN    PROCEDURES:  Critical Care performed: No  .1-3 Lead EKG Interpretation Performed by: Blake Divine, MD Authorized by: Blake Divine, MD     Interpretation: normal     ECG rate:  75-90   ECG rate assessment: normal     Rhythm: sinus rhythm  Ectopy: none     Conduction: normal     MEDICATIONS ORDERED IN ED: Medications  0.9 %  sodium chloride infusion (has no administration in time range)  lactated ringers bolus 1,000 mL (1,000 mLs Intravenous New Bag/Given 10/05/21 0924)     IMPRESSION / MDM / ASSESSMENT AND PLAN / ED COURSE  I reviewed the triage vital signs and the nursing notes.                              61 y.o. male with past medical history of hypertension, hyperlipidemia, CAD, GERD, and alcohol abuse who presents to the ED complaining of about 10 bloody bowel movements over the course of the night after he had colonoscopy and polypectomy performed yesterday.  Differential diagnosis includes, but is not limited to, lower GI bleed, upper GI bleed, anemia, arrhythmia, electrolyte abnormality.  Patient is nontoxic-appearing and in no acute  distress, vital signs are reassuring with stable heart rate and blood pressure.  Labs remarkable for slight drop in hemoglobin from 15 a couple of months ago to 13 today but given he is hemodynamically stable, we will hold off on blood transfusion.  Case discussed with Dr. [name] of GI, who request we perform tapwater enema and will plan for colonoscopy later this afternoon.  The patient is on the cardiac monitor to evaluate for evidence of arrhythmia and/or significant heart rate changes.  BMP is unremarkable with no AKI or electrolyte abnormality, patient remains hemodynamically stable here in the ED.  GI will plan for flexible sigmoidoscopy later today for further assessment, patient will not require admission to the hospital unless there are any concerning findings.  Sigmoidoscopy revealed that bleeding was more proximal than area of prior polypectomy.  Dr. [name] is requesting admission to the hospital for further management and case was discussed with hospitalist for admission.      FINAL CLINICAL IMPRESSION(S) / ED DIAGNOSES   Final diagnoses:  Lower GI bleed     Rx / DC Orders   ED Discharge Orders     None        Note:  This document was prepared using Dragon voice recognition software and may include unintentional dictation errors.   Blake Divine, MD 10/05/21 4315    Blake Divine, MD 10/05/21 [phone]

## 2021-10-05 NOTE — ED Triage Notes (Signed)
Pt here with rectal bleeding. Pt had a colonoscopy on yesterday and began having bleeding after his first BM yesterday. Pt was instructed to come to the ED for blood work. Pt in NAD in triage.

## 2021-10-05 NOTE — H&P (Signed)
[name] Darby, MD [address]  [place]  [place], [place] [address]  Main: [phone]  Fax: [phone] Pager: [phone]  Primary Care Physician:  Ria Bush, MD Primary Gastroenterologist:  Dr. Cephas [name]  Pre-Procedure History & Physical: HPI:  [name] [name] is a 61 y.o. male is here for an flexible sigmoidoscopy.   Past Medical History:  Diagnosis Date   Alcohol use    regularly 3 beers/day   CAD (coronary artery disease)    mild   Dyslipidemia    GERD (gastroesophageal reflux disease)    with esophagitis   History of Helicobacter pylori infection    Hypertension, essential    Obesity, Class I, BMI 30-34.9 01/19/2016    Past Surgical History:  Procedure Laterality Date   ANTERIOR CERVICAL DECOMP/DISCECTOMY FUSION  2012   C4-5 ([place])   ANTERIOR CERVICAL DECOMP/DISCECTOMY FUSION N/A 03/12/2018   ACDF C4-6, EXPLORATION OF FUSION C6-7, REMOVAL OF HARDWARE;  [name], [name], MD)   ARTERY BIOPSY Left 10/20/2019   TEMPORAL ARTERY BIOPSY - [name] Dolores Lory, MD   CARDIAC CATHETERIZATION  2013   no significant CAD, nl LVEF 60% [name])   COLONOSCOPY  2012   WNL per PCP report   COLONOSCOPY WITH PROPOFOL N/A 10/04/2021   Procedure: COLONOSCOPY WITH PROPOFOL;  Surgeon: [name], MD;  Location: [facility] ENDOSCOPY;  Service: Gastroenterology;  Laterality: N/A;   CT CTA CORONARY W/CA SCORE W/CM &/OR WO/CM  2015   minor luminal irregularities, Ca score = 90 [name])   KNEE ARTHROSCOPY Bilateral    LEFT HEART CATH AND CORONARY ANGIOGRAPHY N/A 07/31/2021   Procedure: LEFT HEART CATH AND CORONARY ANGIOGRAPHY;  Surgeon: [name], MD;  Location: [place];  Service: Cardiovascular;  Laterality: N/A;   ROTATOR CUFF REPAIR Right 2019   SCLEROTHERAPY     legs    Prior to Admission medications   Medication Sig Start Date End Date Taking? Authorizing Provider  atorvastatin (LIPITOR) 40 MG tablet TAKE 1 TABLET(40 MG) BY MOUTH DAILY AT 6 PM  08/07/21  Yes Ria Bush, MD  omeprazole (PRILOSEC) 20 MG capsule TAKE 1 CAPSULE(20 MG) BY MOUTH DAILY 08/07/21  Yes Ria Bush, MD  indomethacin (INDOCIN) 50 MG capsule Take 50 mg by mouth 3 (three) times daily as needed. 08/20/21   [provider]    Allergies as of 10/05/2021 - Review Complete 10/05/2021  Allergen Reaction Noted   Isosorbide nitrate Other (See Comments) and Shortness Of Breath 09/03/2021   Metoprolol tartrate Other (See Comments) and Shortness Of Breath 09/03/2021    Family History  Problem Relation Age of Onset   CAD Father 77       MI   Hyperlipidemia Father    Diabetes Paternal Grandfather    Cancer Mother 25       deceased from bone cancer   Stroke Neg Hx     Social History   Socioeconomic History   Marital status: Married    Spouse name: Not on file   Number of children: Not on file   Years of education: Not on file   Highest education level: Not on file  Occupational History   Not on file  Tobacco Use   Smoking status: Never   Smokeless tobacco: Current    Types: Chew  Vaping Use   Vaping Use: Never used  Substance and Sexual Activity   Alcohol use: Yes    Alcohol/week: 0.0 standard drinks    Comment: 8 oz  whiskey a night   Drug use: No   Sexual activity: Not on file  Other Topics Concern   Not on file  Social History Narrative   Lives with wife, 1 dog   Edu: HS   Occ: maintenance   Activity: active at work   Diet: some water, fruits daily, beer   Social Determinants of Health   Financial Resource Strain: Not on file  Food Insecurity: Not on file  Transportation Needs: Not on file  Physical Activity: Not on file  Stress: Not on file  Social Connections: Not on file  Intimate Partner Violence: Not on file    Review of Systems: See HPI, otherwise negative ROS  Physical Exam: BP 133/75    Pulse 63    Temp (!) 97 F (36.1 C) (Temporal)    Resp 18    Ht 5\' 8"  (1.727 m)    Wt 113.4 kg    SpO2 100%    BMI  38.01 kg/m  General:   Alert,  pleasant and cooperative in NAD Head:  Normocephalic and atraumatic. Neck:  Supple; no masses or thyromegaly. Lungs:  Clear throughout to auscultation.    Heart:  Regular rate and rhythm. Abdomen:  Soft, nontender and nondistended. Normal bowel sounds, without guarding, and without rebound.   Neurologic:  Alert and  oriented x4;  grossly normal neurologically.  Impression/Plan: [name] [name] is here for an flexible sigmoidoscopy to be performed for post polypectomy bleed  Risks, benefits, limitations, and alternatives regarding  flexible sigmoidoscopy have been reviewed with the patient.  Questions have been answered.  All parties agreeable.   Sherri Sear, MD  10/05/2021, 11:58 AM

## 2021-10-05 NOTE — Consult Note (Signed)
[name] Darby, MD [address]  [name]  [place], [place] [address]  Main: [phone]  Fax: [phone] Pager: [phone]   Consultation  Referring Provider:     No ref. provider found Primary Care Physician:  Ria Bush, MD Primary Gastroenterologist:  Dr. Sherri Sear         Reason for Consultation:     Rectal bleeding  Date of Admission:  10/05/2021 Date of Consultation:  10/05/2021         HPI:   [name] [name] is a 61 y.o. male with history of nonobstructive coronary disease, GERD who underwent surveillance colonoscopy yesterday for prior history of colon polyps about 10 years ago.  Patient had 3 small, less than 5 mm polyps in the colon that were removed, he also had 1 cm pegylated polyp in the rectosigmoid colon that was removed with hot snare.  He had left-sided diverticulosis as well as nonbleeding hemorrhoids.  Patient now presented with rectal bleeding that started yesterday afternoon.  He had 5-6 large bloody bowel movements with blood clots.  He felt lightheaded last night.  I advised him to come to the emergency room.  His heart rate and blood pressure were marginally abnormal compared to his baseline.  His hemoglobin dropped from 15.4 to 13.2.  He received 1 L of IV fluids, tapwater enema with the plan to perform flexible sigmoidoscopy.  Flexible sigmoidoscopy revealed old blood in the entire examined colon, extending up to transverse colon.  Post polypectomy site was unable to be identified given significant amount of old blood. Given concern for proximal GI bleed, after discussing with patient, I recommended admission to the hospital for further evaluation and patient agreed Patient denies any abdominal pain, nausea or vomiting  NSAIDs: Indomethacin  Antiplts/Anticoagulants/Anti thrombotics: None  GI Procedures:  Colonoscopy 10/04/2021 - Hemorrhoids found on perianal exam. - The examined portion of the ileum was normal. - One diminutive polyp in  the ascending colon, removed with a cold biopsy forceps. Resected and retrieved. - One 4 mm polyp in the descending colon, removed with a cold snare. Resected and retrieved. - One 10 mm polyp at the recto-sigmoid colon, removed with a hot snare. Resected and retrieved. - One 3 mm polyp at the recto-sigmoid colon, removed with a cold snare. Resected and retrieved. - Diverticulosis in the recto-sigmoid colon, in the sigmoid colon and in the descending colon. - Non-bleeding external hemorrhoids.  DIAGNOSIS:  A. COLON POLYP, ASCENDING; COLD BIOPSY:  - POLYPOID FRAGMENT OF BENIGN COLONIC MUCOSA WITH SUPERFICIAL REACTIVE  CHANGES AND PROMINENT SUBMUCOSAL LYMPHOID AGGREGATE.  - NEGATIVE FOR DYSPLASIA AND MALIGNANCY.   B. COLON POLYP, DESCENDING; COLD SNARE:  - TUBULAR ADENOMA.  - NEGATIVE FOR HIGH-GRADE DYSPLASIA AND MALIGNANCY.   C. COLON POLYP, RECTOSIGMOID; HOT SNARE:  - TUBULAR ADENOMA.  - LOW-GRADE DYSPLASIA FOCALLY PRESENT AT CAUTERY ARTIFACT.  - NEGATIVE FOR HIGH-GRADE DYSPLASIA AND MALIGNANCY.   Past Medical History:  Diagnosis Date   Alcohol use    regularly 3 beers/day   CAD (coronary artery disease)    mild   Dyslipidemia    GERD (gastroesophageal reflux disease)    with esophagitis   History of Helicobacter pylori infection    Hypertension, essential    Obesity, Class I, BMI 30-34.9 01/19/2016    Past Surgical History:  Procedure Laterality Date   ANTERIOR CERVICAL DECOMP/DISCECTOMY FUSION  2012   C4-5 ([place])   ANTERIOR CERVICAL DECOMP/DISCECTOMY FUSION N/A 03/12/2018   ACDF C4-6,  EXPLORATION OF FUSION C6-7, REMOVAL OF HARDWARE;  [name], [name], MD)   ARTERY BIOPSY Left 10/20/2019   TEMPORAL ARTERY BIOPSY - Schnier, Dolores Lory, MD   CARDIAC CATHETERIZATION  2013   no significant CAD, nl LVEF 60% [name])   COLONOSCOPY  2012   WNL per PCP report   COLONOSCOPY WITH PROPOFOL N/A 10/04/2021   Procedure: COLONOSCOPY WITH PROPOFOL;  Surgeon: [name], MD;   Location: [facility] ENDOSCOPY;  Service: Gastroenterology;  Laterality: N/A;   CT CTA CORONARY W/CA SCORE W/CM &/OR WO/CM  2015   minor luminal irregularities, Ca score = 90 [name])   KNEE ARTHROSCOPY Bilateral    LEFT HEART CATH AND CORONARY ANGIOGRAPHY N/A 07/31/2021   Procedure: LEFT HEART CATH AND CORONARY ANGIOGRAPHY;  Surgeon: [name], MD;  Location: [place] [facility];  Service: Cardiovascular;  Laterality: N/A;   ROTATOR CUFF REPAIR Right 2019   SCLEROTHERAPY     legs    Prior to Admission medications   Medication Sig Start Date End Date Taking? Authorizing Provider  atorvastatin (LIPITOR) 40 MG tablet TAKE 1 TABLET(40 MG) BY MOUTH DAILY AT 6 PM 08/07/21  Yes Ria Bush, MD  omeprazole (PRILOSEC) 20 MG capsule TAKE 1 CAPSULE(20 MG) BY MOUTH DAILY 08/07/21  Yes Ria Bush, MD  indomethacin (INDOCIN) 50 MG capsule Take 50 mg by mouth 3 (three) times daily as needed. 08/20/21   [provider]    Current Facility-Administered Medications:    0.9 %  sodium chloride infusion, , Intravenous, Continuous, Eila Runyan, [name], MD, Last Rate: 10 mL/hr at 10/05/21 1003, 10 mL at 10/05/21 1003   0.9 %  sodium chloride infusion, , Intravenous, Continuous, Ivor Costa, MD  Family History  Problem Relation Age of Onset   CAD Father 74       MI   Hyperlipidemia Father    Diabetes Paternal Grandfather    Cancer Mother 15       deceased from bone cancer   Stroke Neg Hx      Social History   Tobacco Use   Smoking status: Never   Smokeless tobacco: Current    Types: Chew  Vaping Use   Vaping Use: Never used  Substance Use Topics   Alcohol use: Yes    Alcohol/week: 0.0 standard drinks    Comment: 8 oz whiskey a night   Drug use: No    Allergies as of 10/05/2021 - Review Complete 10/05/2021  Allergen Reaction Noted   Isosorbide nitrate Other (See Comments) and Shortness Of Breath 09/03/2021   Metoprolol tartrate Other (See Comments) and Shortness Of  Breath 09/03/2021    Review of Systems:    All systems reviewed and negative except where noted in HPI.   Physical Exam:  Vital signs in last 24 hours: Temp:  [97 F (36.1 C)-98.2 F (36.8 C)] 98.2 F (36.8 C) (02/10 1315) Pulse Rate:  [63-97] 64 (02/10 1315) Resp:  [18-20] 20 (02/10 1315) BP: (107-145)/(73-89) 140/82 (02/10 1315) SpO2:  [99 %-100 %] 100 % (02/10 1315) Weight:  [113.4 kg] 113.4 kg (02/10 0825)   General:   Pleasant, cooperative in NAD Head:  Normocephalic and atraumatic. Eyes:   No icterus.   Conjunctiva pink. PERRLA. Ears:  Normal auditory acuity. Neck:  Supple; no masses or thyroidomegaly Lungs: Respirations even and unlabored. Lungs clear to auscultation bilaterally.   No wheezes, crackles, or rhonchi.  Heart:  Regular rate and rhythm;  Without murmur, clicks, rubs or gallops Abdomen:  Soft,  nondistended, nontender. Normal bowel sounds. No appreciable masses or hepatomegaly.  No rebound or guarding.  Rectal:  Not performed. Msk:  Symmetrical without gross deformities.  Strength normal Extremities:  Without edema, cyanosis or clubbing. Neurologic:  Alert and oriented x3;  grossly normal neurologically. Skin:  Intact without significant lesions or rashes. Cervical Nodes:  No significant cervical adenopathy. Psych:  Alert and cooperative. Normal affect.  LAB RESULTS: CBC Latest Ref Rng & Units 10/05/2021 07/29/2021 07/09/2021  WBC 4.0 - 10.5 K/uL 6.1 6.5 7.8  Hemoglobin 13.0 - 17.0 g/dL 13.2 15.4 16.4  Hematocrit 39.0 - 52.0 % 38.8(L) 43.8 47.5  Platelets 150 - 400 K/uL 222 258 241    BMET BMP Latest Ref Rng & Units 10/05/2021 08/07/2021 07/29/2021  Glucose 70 - 99 mg/dL 121(H) 86 137(H)  BUN 6 - 20 mg/dL _0 Creatinine 0.61 - 1.24 mg/dL 0.63 0.69 0.69  BUN/Creat Ratio 6 - 22 (calc) - - -  Sodium 135 - 145 mmol/L 135 139 135  Potassium 3.5 - 5.1 mmol/L 4.0 4.6 3.2(L)  Chloride 98 - 111 mmol/L 103 103 100  CO2 22 - 32 mmol/L _1 Calcium 8.9  - 10.3 mg/dL 8.6(L) 9.6 9.0    LFT Hepatic Function Latest Ref Rng & Units 10/05/2021 08/07/2021 07/12/2020  Total Protein 6.5 - 8.1 g/dL 6.6 6.4 7.4  Albumin 3.5 - 5.0 g/dL 3.8 4.1 4.3  AST 15 - 41 U/L 34 31 28  ALT 0 - 44 U/L 54(H) 60(H) 39  Alk Phosphatase 38 - 126 U/L 98 98 98  Total Bilirubin 0.3 - 1.2 mg/dL 0.7 0.6 0.8     STUDIES: No results found.    Impression / Plan:   ABDULAI BLAYLOCK is a 61 y.o. Caucasian male with obesity, nonobstructive coronary artery disease presented with lower GI bleed after colonoscopy on 10/04/2021 with 1cm pedunculated polyp in the rectosigmoid colon resected with hot snare  Admit to general medicine service Flexible sigmoidoscopy revealed blood in the proximal colon N.p.o. based on the timing of Tagged RBC scan Okay with clear liquid diet If tagged RBC scan is negative, proceed with colonoscopy tomorrow after bowel prep Monitor CBC closely, maintain hemoglobin above 8 Discussed my recommendations with both patient and his wife, expressed understanding and agreed with admission  Thank you for involving me in the care of this patient.  Dr. Haig Prophet will cover for the weekend    LOS: 0 days   Sherri Sear, MD  10/05/2021, 2:27 PM    Note: This dictation was prepared with Dragon dictation along with smaller phrase technology. Any transcriptional errors that result from this process are unintentional.

## 2021-10-05 NOTE — Progress Notes (Signed)
Called patient this morning to check on his bleeding.  He had 3 more episodes of rectal bleeding and he felt lightheaded last night, fell to the ground, did not hit his head or passed out.  Advised his wife to bring him to the ER and she agreed.  Vitals revealed slightly low blood pressure compared to baseline and with increase in his heart rate compared to baseline.  Received 1 L of IV fluids.  He received tapwater enema and states that it slowly clearing up.  His hemoglobin dropped from 15.2 to 13.  Discussed with him to proceed with flexible sigmoidoscopy to evaluate the post polypectomy site in the sigmoid colon.  The pathology results came back as tubular adenoma with low-grade dysplasia.  We will proceed with flexible sigmoidoscopy without any sedation  I have discussed alternative options, risks & benefits,  which include, but are not limited to, bleeding, infection, perforation,respiratory complication & drug reaction.  The patient agrees with this plan & written consent will be obtained.     Cephas Darby, MD [facility], [place] [address]  [place]  [place], [place] [address]  Main: [phone]  Fax: [phone] Pager: [phone]

## 2021-10-05 NOTE — Op Note (Signed)
[facility]astroenterology Patient Name: [name] [name] Procedure Date: 10/05/2021 12:41 PM MRN: [id number] Account #: [id number] Date of Birth: [birth year] Admit Type: Outpatient Age: 61 Room: [facility] ENDO ROOM 3 Gender: Male Note Status: Finalized Instrument Name: Upper Endoscope [id number] Procedure:             Flexible Sigmoidoscopy Indications:           Hematochezia Providers:             [name] MD, MD Medicines:             None Complications:         No immediate complications. Estimated blood loss: None. Procedure:             Pre-Anesthesia Assessment:                        - Prior to the procedure, a History and Physical was                         performed, and patient medications and allergies were                         reviewed. The patient is competent. The risks and                         benefits of the procedure and the sedation options and                         risks were discussed with the patient. All questions                         were answered and informed consent was obtained.                         Patient identification and proposed procedure were                         verified by the physician, the nurse and the                         technician in the pre-procedure area in the procedure                         room in the endoscopy suite. Mental Status                         Examination: alert and oriented. Airway Examination:                         normal oropharyngeal airway and neck mobility.                         Respiratory Examination: clear to auscultation. CV                         Examination: normal. Prophylactic Antibiotics: The                         patient does not require prophylactic antibiotics.  Prior Anticoagulants: The patient has taken no                         previous anticoagulant or antiplatelet agents. ASA                         Grade Assessment: III - A patient  with severe systemic                         disease. After reviewing the risks and benefits, the                         patient was deemed in satisfactory condition to                         undergo the procedure. The anesthesia plan was to use                         no sedation or anesthesia. Immediately prior to                         administration of medications, the patient was                         re-assessed for adequacy to receive sedatives. The                         heart rate, respiratory rate, oxygen saturations,                         blood pressure, adequacy of pulmonary ventilation, and                         response to care were monitored throughout the                         procedure. The physical status of the patient was                         re-assessed after the procedure.                        After obtaining informed consent, the scope was passed                         under direct vision. The Endoscope was introduced                         through the anus and advanced to the the splenic                         flexure. The flexible sigmoidoscopy was accomplished                         without difficulty. The patient tolerated the                         procedure well. The quality of the bowel preparation  was poor. Findings:      The perianal and digital rectal examinations were normal. Pertinent       negatives include normal sphincter tone and no palpable rectal lesions.      Old Red blood mixed with stool was found in the entire examined colon to       splenic flexure. The large polyp was in the sigmoid colon but the blood       was found farther proximal to the polypectomy site making it unlikley a       post polypectomy bleed. Patinet has divertuclosis which is a possibility       given the colonoscopy findings      I was unable to identify the polypectomy site in the sigmoid colon due       to old  blood Impression:            - Preparation of the colon was poor.                        - Blood in the entire examined colon.                        - No specimens collected. Recommendation:        - Admit the patient to hospital ward until patient is                         stable.                        - Clear liquid diet today.                        - Perform a colonoscopy tomorrow if nuclear medicine                         bleeding scan is negative.                        - Do a GI bleeding (tagged RBC) scan today. Procedure Code(s):     --- Professional ---                        [phone], Sigmoidoscopy, flexible; diagnostic, including                         collection of specimen(s) by brushing or washing, when                         performed (separate procedure) Diagnosis Code(s):     --- Professional ---                        K92.2, Gastrointestinal hemorrhage, unspecified                        K92.1, Melena (includes Hematochezia) CPT copyright 2019 American Medical Association. All rights reserved. The codes documented in this report are preliminary and upon coder review may  be revised to meet current compliance requirements. Dr. Ulyess Mort [name] MD, MD 10/05/2021 1:19:31 PM This report has been signed electronically. Number of Addenda: 0 Note Initiated On: 10/05/2021 12:41 PM Total Procedure Duration:  0 hours 9 minutes 23 seconds  Estimated Blood Loss:  Estimated blood loss: none.      [facility]

## 2021-10-05 NOTE — H&P (Signed)
History and Physical    [name] [name] DTO:[id number] DOB: November 05, 1960 DOA: 10/05/2021  Referring MD/NP/PA:   PCP: Ria Bush, MD   Patient coming from:  The patient is coming from home.  At baseline, pt is independent for most of ADL.        Chief Complaint: rectal bleeding  HPI: [name] [name] is a 61 y.o. male with medical history significant of hypertension, hyperlipidemia, GERD, alcohol abuse, CAD, who presents with rectal bleeding.  Patient had colonoscopy by Dr. [name] of GI yesterday, and had multiple polyps removed. After he returned home, he began having bloody bowel movements yesterday evening. He has had about 7 times of rectal bleeding, with bright red blood, also with dark blood clot.  Patient denies nausea vomiting, abdominal pain.  No fever or chills.  Denies chest pain, cough, shortness of breath.  He dizziness and lightheadedness. He spoke with GI office earlier today, who recommended he come to the ED for further evaluation. Pt was seen by Dr. [name] of GI who did sigmoidoscopy today. Pt was found to have bleeding above the sigmoid colon.  Flexible sigmoid colonoscopy by Dr. [name] - Preparation of the colon was poor. - Blood in the entire examined colon. - No specimens collected. Impression: - Admit the patient to hospital ward until patient is stable. - Clear liquid diet today. - Perform a colonoscopy tomorrow if nuclear medicine bleeding scan is negative. - Do a GI bleeding (tagged RBC) scan today.  Data Reviewed and ED Course: pt was found to have hemoglobin 13.2, WBC 6.1, negative COVID PCR, electrolytes renal function okay, temperature normal, blood pressure 140/82, heart rate 97, RR 20, oxygen saturation 95-100% on 2 L oxygen.  Patient is admitted to [place] bed as inpatient.      EKG: Not done in ED, will get one.     Review of Systems:   General: no fevers, chills, no body weight gain HEENT: no blurry vision, hearing changes or sore throat Respiratory:  no dyspnea, coughing, wheezing CV: no chest pain, no palpitations GI: no nausea, vomiting, abdominal pain, diarrhea, constipation. Has rectal bleeding. GU: no dysuria, burning on urination, increased urinary frequency, hematuria  Ext: no leg edema Neuro: no unilateral weakness, numbness, or tingling, no vision change or hearing loss. Has dizziness Skin: no rash, no skin tear. MSK: No muscle spasm, no deformity, no limitation of range of movement in spin Heme: No easy bruising.  Travel history: No recent long distant travel.   Allergy:  Allergies  Allergen Reactions   Isosorbide Nitrate Other (See Comments) and Shortness Of Breath   Metoprolol Tartrate Other (See Comments) and Shortness Of Breath    Past Medical History:  Diagnosis Date   Alcohol use    regularly 3 beers/day   CAD (coronary artery disease)    mild   Dyslipidemia    GERD (gastroesophageal reflux disease)    with esophagitis   History of Helicobacter pylori infection    Hypertension, essential    Obesity, Class I, BMI 30-34.9 01/19/2016    Past Surgical History:  Procedure Laterality Date   ANTERIOR CERVICAL DECOMP/DISCECTOMY FUSION  2012   C4-5 ([place])   ANTERIOR CERVICAL DECOMP/DISCECTOMY FUSION N/A 03/12/2018   ACDF C4-6, EXPLORATION OF FUSION C6-7, REMOVAL OF HARDWARE;  [name], Duane Lope, MD)   ARTERY BIOPSY Left 10/20/2019   TEMPORAL ARTERY BIOPSY - [name], MD   CARDIAC CATHETERIZATION  2013   no significant CAD, nl LVEF 60% [name])   COLONOSCOPY  2012   WNL per PCP report   COLONOSCOPY WITH PROPOFOL N/A 10/04/2021   Procedure: COLONOSCOPY WITH PROPOFOL;  Surgeon: [name], MD;  Location: [facility] ENDOSCOPY;  Service: Gastroenterology;  Laterality: N/A;   CT CTA CORONARY W/CA SCORE W/CM &/OR WO/CM  2015   minor luminal irregularities, Ca score = 90 [name])   KNEE ARTHROSCOPY Bilateral    LEFT HEART CATH AND CORONARY ANGIOGRAPHY N/A 07/31/2021   Procedure: LEFT HEART CATH AND CORONARY  ANGIOGRAPHY;  Surgeon: [name], MD;  Location: [facility];  Service: Cardiovascular;  Laterality: N/A;   ROTATOR CUFF REPAIR Right 2019   SCLEROTHERAPY     legs    Social History:  reports that he has never smoked. His smokeless tobacco use includes chew. He reports current alcohol use. He reports that he does not use drugs.  Family History:  Family History  Problem Relation Age of Onset   CAD Father 82       MI   Hyperlipidemia Father    Diabetes Paternal Grandfather    Cancer Mother 27       deceased from bone cancer   Stroke Neg Hx      Prior to Admission medications   Medication Sig Start Date End Date Taking? Authorizing Provider  atorvastatin (LIPITOR) 40 MG tablet TAKE 1 TABLET(40 MG) BY MOUTH DAILY AT 6 PM 08/07/21  Yes Ria Bush, MD  omeprazole (PRILOSEC) 20 MG capsule TAKE 1 CAPSULE(20 MG) BY MOUTH DAILY 08/07/21  Yes Ria Bush, MD  indomethacin (INDOCIN) 50 MG capsule Take 50 mg by mouth 3 (three) times daily as needed. 08/20/21   [provider]    Physical Exam: Vitals:   10/05/21 1131 10/05/21 1310 10/05/21 1315 10/05/21 1445  BP: 133/75 (!) 145/89 140/82 132/80  Pulse: 63 64 64 68  Resp: 18  20 16   Temp: (!) 97 F (36.1 C)  98.2 F (36.8 C)   TempSrc: Temporal  Temporal   SpO2: 100% 100% 100% 100%  Weight:      Height:       General: Not in acute distress HEENT:       Eyes: PERRL, EOMI, no scleral icterus.       ENT: No discharge from the ears and nose, no pharynx injection, no tonsillar enlargement.        Neck: No JVD, no bruit, no mass felt. Heme: No neck lymph node enlargement. Cardiac: S1/S2, RRR, No murmurs, No gallops or rubs. Respiratory: No rales, wheezing, rhonchi or rubs. GI: Soft, nondistended, nontender, no rebound pain, no organomegaly, BS present. GU: No hematuria Ext: No pitting leg edema bilaterally. 1+DP/PT pulse bilaterally. Musculoskeletal: No joint deformities, No joint redness or  warmth, no limitation of ROM in spin. Skin: No rashes.  Neuro: Alert, oriented X3, cranial nerves II-XII grossly intact, moves all extremities normally.  Psych: Patient is not psychotic, no suicidal or hemocidal ideation.  Labs on Admission: I have personally reviewed following labs and imaging studies  CBC: Recent Labs  Lab 10/05/21 0830  WBC 6.1  HGB 13.2  HCT 38.8*  MCV 92.8  PLT 970   Basic Metabolic Panel: Recent Labs  Lab 10/05/21 0830  NA 135  K 4.0  CL 103  CO2 25  GLUCOSE 121*  BUN 14  CREATININE 0.63  CALCIUM 8.6*   GFR: Estimated Creatinine Clearance: 121.5 mL/min (by C-G formula based on SCr of 0.63 mg/dL). Liver Function Tests: Recent Labs  Lab 10/05/21 0830  AST 34  ALT 54*  ALKPHOS 98  BILITOT 0.7  PROT 6.6  ALBUMIN 3.8   No results for input(s): LIPASE, AMYLASE in the last 168 hours. No results for input(s): AMMONIA in the last 168 hours. Coagulation Profile: No results for input(s): INR, PROTIME in the last 168 hours. Cardiac Enzymes: No results for input(s): CKTOTAL, CKMB, CKMBINDEX, TROPONINI in the last 168 hours. BNP (last 3 results) No results for input(s): PROBNP in the last 8760 hours. HbA1C: No results for input(s): HGBA1C in the last 72 hours. CBG: No results for input(s): GLUCAP in the last 168 hours. Lipid Profile: No results for input(s): CHOL, HDL, LDLCALC, TRIG, CHOLHDL, LDLDIRECT in the last 72 hours. Thyroid Function Tests: No results for input(s): TSH, T4TOTAL, FREET4, T3FREE, THYROIDAB in the last 72 hours. Anemia Panel: No results for input(s): VITAMINB12, FOLATE, FERRITIN, TIBC, IRON, RETICCTPCT in the last 72 hours. Urine analysis:    Component Value Date/Time   COLORURINE YELLOW (A) 07/12/2020 1019   APPEARANCEUR CLEAR (A) 07/12/2020 1019   LABSPEC 1.011 07/12/2020 1019   PHURINE 6.0 07/12/2020 1019   GLUCOSEU NEGATIVE 07/12/2020 1019   HGBUR NEGATIVE 07/12/2020 1019   BILIRUBINUR NEGATIVE 07/12/2020 1019    KETONESUR NEGATIVE 07/12/2020 1019   PROTEINUR NEGATIVE 07/12/2020 1019   NITRITE NEGATIVE 07/12/2020 1019   LEUKOCYTESUR NEGATIVE 07/12/2020 1019   Sepsis Labs: @LABRCNTIP (procalcitonin:4,lacticidven:4) ) Recent Results (from the past 240 hour(s))  Resp Panel by RT-PCR (Flu A&B, Covid) Nasopharyngeal Swab     Status: None   Collection Time: 10/05/21  8:52 AM   Specimen: Nasopharyngeal Swab; Nasopharyngeal(NP) swabs in vial transport medium  Result Value Ref Range Status   SARS Coronavirus 2 by RT PCR NEGATIVE NEGATIVE Final    Comment: (NOTE) SARS-CoV-2 target nucleic acids are NOT DETECTED.  The SARS-CoV-2 RNA is generally detectable in upper respiratory specimens during the acute phase of infection. The lowest concentration of SARS-CoV-2 viral copies this assay can detect is 138 copies/mL. A negative result does not preclude SARS-Cov-2 infection and should not be used as the sole basis for treatment or other patient management decisions. A negative result may occur with  improper specimen collection/handling, submission of specimen other than nasopharyngeal swab, presence of viral mutation(s) within the areas targeted by this assay, and inadequate number of viral copies(<138 copies/mL). A negative result must be combined with clinical observations, patient history, and epidemiological information. The expected result is Negative.  Fact Sheet for Patients:  EntrepreneurPulse.com.au  Fact Sheet for Healthcare Providers:  IncredibleEmployment.be  This test is no t yet approved or cleared by the [place] FDA and  has been authorized for detection and/or diagnosis of SARS-CoV-2 by FDA under an Emergency Use Authorization (EUA). This EUA will remain  in effect (meaning this test can be used) for the duration of the COVID-19 declaration under Section 564(b)(1) of the Act, 21 U.S.C.section 360bbb-3(b)(1), unless the authorization is  terminated  or revoked sooner.       Influenza A by PCR NEGATIVE NEGATIVE Final   Influenza B by PCR NEGATIVE NEGATIVE Final    Comment: (NOTE) The Xpert Xpress SARS-CoV-2/FLU/RSV plus assay is intended as an aid in the diagnosis of influenza from Nasopharyngeal swab specimens and should not be used as a sole basis for treatment. Nasal washings and aspirates are unacceptable for Xpert Xpress SARS-CoV-2/FLU/RSV testing.  Fact Sheet for Patients: EntrepreneurPulse.com.au  Fact Sheet for Healthcare Providers: IncredibleEmployment.be  This test is not yet approved or cleared by the [place]  States FDA and has been authorized for detection and/or diagnosis of SARS-CoV-2 by FDA under an Emergency Use Authorization (EUA). This EUA will remain in effect (meaning this test can be used) for the duration of the COVID-19 declaration under Section 564(b)(1) of the Act, 21 U.S.C. section 360bbb-3(b)(1), unless the authorization is terminated or revoked.  Performed at [facility], [address], [place], [place] [address]      Radiological Exams on Admission: No results found.    Assessment/Plan Principal Problem:   Rectal bleeding Active Problems:   GERD (gastroesophageal reflux disease)   CAD (coronary artery disease)   HLD (hyperlipidemia)   Alcohol abuse   HTN (hypertension)  Rectal bleeding: Hgb stable, 13.2. Dr. [name] recommended to get tagged RBC bleeding scan  - will admitted to med-surg bed as inpatient - IVF: 1L LR bolus, then at 75 mL/hr - Zofran IV for nausea - Avoid NSAIDs and SQ heparin - Maintain IV access (2 large bore IVs if possible). - Monitor closely and follow q6h cbc, transfuse as necessary, if Hgb<7.0 - LaB: INR, PTT and type screen -f/u tagged RBC NM scan  GERD (gastroesophageal reflux disease) -protonic  CAD (coronary artery disease): no chest pain -lipitor  HLD  (hyperlipidemia) -lipitor  Alcohol abuse -CiWA protocol  HTN (hypertension): no taking meds now. Bp 140/82 -IV prn hydralazine     DVT ppx: SCD  Code Status: Full code  Family Communication: not done, no family member is at bed side.      Disposition Plan:  Anticipate discharge back to previous environment  Consults called:  Dr. [name]  Admission status and Level of care: Med-Surg:    as inpt      Severity of Illness:  The appropriate patient status for this patient is INPATIENT. Inpatient status is judged to be reasonable and necessary in order to provide the required intensity of service to ensure the patient's safety. The patient's presenting symptoms, physical exam findings, and initial radiographic and laboratory data in the context of their chronic comorbidities is felt to place them at high risk for further clinical deterioration. Furthermore, it is not anticipated that the patient will be medically stable for discharge from the hospital within 2 midnights of admission.   * I certify that at the point of admission it is my clinical judgment that the patient will require inpatient hospital care spanning beyond 2 midnights from the point of admission due to high intensity of service, high risk for further deterioration and high frequency of surveillance required.*       Date of Service 10/05/2021    Ivor Costa [facility]   If 7PM-7AM, please contact night-coverage [email or web address] 10/05/2021, 6:04 PM

## 2021-10-06 ENCOUNTER — Encounter
Admission: EM | Disposition: A | Discharge status: Home / Self Care | Payer: Self-pay | Source: Home / Self Care | Attending: Emergency Medicine

## 2021-10-06 ENCOUNTER — Observation Stay: Payer: 59 | Admitting: Anesthesiology

## 2021-10-06 ENCOUNTER — Encounter: Payer: Self-pay | Admitting: Internal Medicine

## 2021-10-06 DIAGNOSIS — K625 Hemorrhage of anus and rectum: Secondary | ICD-10-CM | POA: Diagnosis not present

## 2021-10-06 HISTORY — PX: COLONOSCOPY WITH PROPOFOL: SHX5780

## 2021-10-06 HISTORY — PX: COLONOSCOPY: SHX174

## 2021-10-06 LAB — CBC
HCT: 31.3 % — ABNORMAL LOW (ref 39.0–52.0)
HCT: 33.4 % — ABNORMAL LOW (ref 39.0–52.0)
Hemoglobin: 10.7 g/dL — ABNORMAL LOW (ref 13.0–17.0)
Hemoglobin: 11.3 g/dL — ABNORMAL LOW (ref 13.0–17.0)
MCH: 31.6 pg (ref 26.0–34.0)
MCH: 31.7 pg (ref 26.0–34.0)
MCHC: 34.2 g/dL (ref 30.0–36.0)
MCHC: 33.8 g/dL (ref 30.0–36.0)
MCV: 92.3 fL (ref 80.0–100.0)
MCV: 93.8 fL (ref 80.0–100.0)
Platelets: 185 10*3/uL (ref 150–400)
Platelets: 182 10*3/uL (ref 150–400)
RBC: 3.39 MIL/uL — ABNORMAL LOW (ref 4.22–5.81)
RBC: 3.56 MIL/uL — ABNORMAL LOW (ref 4.22–5.81)
RDW: 12.6 % (ref 11.5–15.5)
RDW: 12.3 % (ref 11.5–15.5)
WBC: 7.3 10*3/uL (ref 4.0–10.5)
WBC: 5.4 10*3/uL (ref 4.0–10.5)
nRBC: 0 % (ref 0.0–0.2)
nRBC: 0 % (ref 0.0–0.2)

## 2021-10-06 LAB — VITAMIN B12: Vitamin B-12: 322 pg/mL (ref 180–914)

## 2021-10-06 SURGERY — COLONOSCOPY WITH PROPOFOL
Anesthesia: General

## 2021-10-06 MED ORDER — PROPOFOL 500 MG/50ML IV EMUL
INTRAVENOUS | Status: AC
  Filled 2021-10-06: qty 50

## 2021-10-06 MED ORDER — PROPOFOL 500 MG/50ML IV EMUL
INTRAVENOUS | Status: DC | PRN
  Administered 2021-10-06: 150 ug/kg/min via INTRAVENOUS

## 2021-10-06 MED ORDER — GLYCOPYRROLATE 0.2 MG/ML IJ SOLN
INTRAMUSCULAR | Status: DC | PRN
Start: 2021-10-06 — End: 2021-10-06
  Administered 2021-10-06: .1 mg via INTRAVENOUS

## 2021-10-06 MED ORDER — SODIUM CHLORIDE 0.9 % IV SOLN: INTRAVENOUS | Status: DC

## 2021-10-06 MED ORDER — LIDOCAINE HCL (CARDIAC) PF 100 MG/5ML IV SOSY
PREFILLED_SYRINGE | INTRAVENOUS | Status: DC | PRN
  Administered 2021-10-06: 80 mg via INTRAVENOUS

## 2021-10-06 NOTE — Discharge Summary (Signed)
Physician Discharge Summary   Patient: [name] [name] MRN: [id number] DOB: November 20, 1960  Admit date:     10/05/2021  Discharge date: 10/06/21  Discharge Physician: Ezekiel Slocumb   PCP: Ria Bush, MD   Recommendations at discharge:    Follow up with PCP in 1-2 weeks Check CBC in 1 week Follow up Gastroenterology  Discharge Diagnoses: Principal Problem:   Rectal bleeding Active Problems:   HTN (hypertension)   HLD (hyperlipidemia)   GERD (gastroesophageal reflux disease)   CAD (coronary artery disease)   Alcohol abuse  Resolved Problems:  Rectal bleeding  Hospital Course: [name] [name] is a 61 y.o. male with medical history significant of hypertension, hyperlipidemia, GERD, alcohol abuse, CAD, who presented on 10/05/2021 with rectal bleeding after having had a colonoscopy by Dr. [name] of GI the day before with multiple polyps removed. Pt reported about 7 episodes of rectal bleeding after returning home.  When he called the GI clinic, it was recommended he come to the ED for further evaluation. Pt was seen by Dr. [name] and sigmoidoscopy was performed which showed that bleeding above the sigmoid colon.  Colonoscopy planned for 2/11 after bowel prep.  2/11: Hbg fairly stable 10.7     Assessment and Plan: No notes have been filed under this hospital service. Service: Hospitalist          Consultants: gastroenterology Procedures performed: EGD, colonoscopy Disposition: Home Diet recommendation:  Discharge Diet Orders (From admission, onward)     Start     Ordered   10/06/21 0000  Diet - low sodium heart healthy        10/06/21 1336           Cardiac diet  DISCHARGE MEDICATION: Allergies as of 10/06/2021       Reactions   Isosorbide Nitrate Other (See Comments), Shortness Of Breath   Metoprolol Tartrate Other (See Comments), Shortness Of Breath        Medication List     STOP taking these medications    indomethacin 50 MG capsule Commonly  known as: INDOCIN       TAKE these medications    atorvastatin 40 MG tablet Commonly known as: LIPITOR TAKE 1 TABLET(40 MG) BY MOUTH DAILY AT 6 PM   omeprazole 20 MG capsule Commonly known as: PRILOSEC TAKE 1 CAPSULE(20 MG) BY MOUTH DAILY         Discharge Exam: Filed Weights   10/05/21 0825 10/05/21 2007  Weight: 113.4 kg 114.7 kg   General exam: awake, alert, no acute distress HEENT: moist mucus membranes, hearing grossly normal  Respiratory system: normal respiratory effort. Cardiovascular system: normal S1/S2, RRR Gastrointestinal system: soft, non-tender, +bowel sounds. Central nervous system: A&O x3. no gross focal neurologic deficits, normal speech Extremities: moves all , no edema, normal tone Skin: dry, intact, normal temperature Psychiatry: normal mood, congruent affect   Condition at discharge: stable  The results of significant diagnostics from this hospitalization (including imaging, microbiology, ancillary and laboratory) are listed below for reference.   Imaging Studies: NM GI Blood Loss  Result Date: 10/05/2021 CLINICAL DATA:  Hematochezia.  Recent colonoscopy with polypectomy EXAM: NUCLEAR MEDICINE GASTROINTESTINAL BLEEDING SCAN TECHNIQUE: Sequential abdominal images were obtained following intravenous administration of Tc-60m labeled red blood cells. RADIOPHARMACEUTICALS:  19.3 mCi Tc-27m pertechnetate in-vitro labeled red cells. COMPARISON:  CT 07/12/2020 FINDINGS: Normal accumulation of radiotracer within the vascular system. No evidence of active gastrointestinal bleed is seen. Physiologic activity is present within the bladder. IMPRESSION: No scintigraphic  evidence of active gastrointestinal bleeding. Electronically Signed   By: Davina Poke D.O.   On: 10/05/2021 18:24    Microbiology: Results for orders placed or performed during the hospital encounter of 10/05/21  Resp Panel by RT-PCR (Flu A&B, Covid) Nasopharyngeal Swab     Status: None    Collection Time: 10/05/21  8:52 AM   Specimen: Nasopharyngeal Swab; Nasopharyngeal(NP) swabs in vial transport medium  Result Value Ref Range Status   SARS Coronavirus 2 by RT PCR NEGATIVE NEGATIVE Final    Comment: (NOTE) SARS-CoV-2 target nucleic acids are NOT DETECTED.  The SARS-CoV-2 RNA is generally detectable in upper respiratory specimens during the acute phase of infection. The lowest concentration of SARS-CoV-2 viral copies this assay can detect is 138 copies/mL. A negative result does not preclude SARS-Cov-2 infection and should not be used as the sole basis for treatment or other patient management decisions. A negative result may occur with  improper specimen collection/handling, submission of specimen other than nasopharyngeal swab, presence of viral mutation(s) within the areas targeted by this assay, and inadequate number of viral copies(<138 copies/mL). A negative result must be combined with clinical observations, patient history, and epidemiological information. The expected result is Negative.  Fact Sheet for Patients:  EntrepreneurPulse.com.au  Fact Sheet for Healthcare Providers:  IncredibleEmployment.be  This test is no t yet approved or cleared by the [place] FDA and  has been authorized for detection and/or diagnosis of SARS-CoV-2 by FDA under an Emergency Use Authorization (EUA). This EUA will remain  in effect (meaning this test can be used) for the duration of the COVID-19 declaration under Section 564(b)(1) of the Act, 21 U.S.C.section 360bbb-3(b)(1), unless the authorization is terminated  or revoked sooner.       Influenza A by PCR NEGATIVE NEGATIVE Final   Influenza B by PCR NEGATIVE NEGATIVE Final    Comment: (NOTE) The Xpert Xpress SARS-CoV-2/FLU/RSV plus assay is intended as an aid in the diagnosis of influenza from Nasopharyngeal swab specimens and should not be used as a sole basis for treatment.  Nasal washings and aspirates are unacceptable for Xpert Xpress SARS-CoV-2/FLU/RSV testing.  Fact Sheet for Patients: EntrepreneurPulse.com.au  Fact Sheet for Healthcare Providers: IncredibleEmployment.be  This test is not yet approved or cleared by the [place] FDA and has been authorized for detection and/or diagnosis of SARS-CoV-2 by FDA under an Emergency Use Authorization (EUA). This EUA will remain in effect (meaning this test can be used) for the duration of the COVID-19 declaration under Section 564(b)(1) of the Act, 21 U.S.C. section 360bbb-3(b)(1), unless the authorization is terminated or revoked.  Performed at [facility], [address] [place], [place] [address]     Labs: CBC: Recent Labs  Lab 10/05/21 0830 10/05/21 1850 10/05/21 2040 10/06/21 0117 10/06/21 0923  WBC 6.1 7.0 7.8 7.3 5.4  HGB 13.2 11.8* 11.4* 10.7* 11.3*  HCT 38.8* 34.9* 33.3* 31.3* 33.4*  MCV 92.8 93.3 93.0 92.3 93.8  PLT 222 203 197 185 604   Basic Metabolic Panel: Recent Labs  Lab 10/05/21 0830  NA 135  K 4.0  CL 103  CO2 25  GLUCOSE 121*  BUN 14  CREATININE 0.63  CALCIUM 8.6*   Liver Function Tests: Recent Labs  Lab 10/05/21 0830  AST 34  ALT 54*  ALKPHOS 98  BILITOT 0.7  PROT 6.6  ALBUMIN 3.8   CBG: No results for input(s): GLUCAP in the last 168 hours.  Discharge time spent: greater than 30 minutes.  Signed: Ezekiel Slocumb, DO [facility] 10/06/2021

## 2021-10-06 NOTE — Progress Notes (Signed)
Patient had 3 small clear brown bowel movement this morning with red streaks of blood. Nultytely solution given until 6:30 am, ok with provider since patient had not had a bowel movement all night.

## 2021-10-06 NOTE — Transfer of Care (Signed)
Immediate Anesthesia Transfer of Care Note  Patient: [name] [name]  Procedure(s) Performed: COLONOSCOPY WITH PROPOFOL  Patient Location: PACU and Endoscopy Unit  Anesthesia Type:General  Level of Consciousness: awake  Airway & Oxygen Therapy: Patient Spontanous Breathing and Patient connected to nasal cannula oxygen  Post-op Assessment: Report given to RN and Post -op Vital signs reviewed and stable  Post vital signs: Reviewed and stable  Last Vitals:  Vitals Value Taken Time  BP    Temp    Pulse    Resp    SpO2      Last Pain:  Vitals:   10/06/21 1151  TempSrc: Oral  PainSc: 0-No pain         Complications: No notable events documented.

## 2021-10-06 NOTE — Op Note (Signed)
[facility] Gastroenterology Patient Name: [name] [name] Procedure Date: 10/06/2021 10:56 AM MRN: [id number] Account #: [id number] Date of Birth: 09/27/1960 Admit Type: Inpatient Age: 61 Room: [facility] ENDO ROOM 4 Gender: Male Note Status: Finalized Instrument Name: Jasper Riling [id number] Procedure:             Colonoscopy Indications:           Hematochezia Providers:             Andrey Farmer MD, MD Medicines:             Monitored Anesthesia Care Complications:         No immediate complications. Procedure:             Pre-Anesthesia Assessment:                        - Prior to the procedure, a History and Physical was                         performed, and patient medications and allergies were                         reviewed. The patient is competent. The risks and                         benefits of the procedure and the sedation options and                         risks were discussed with the patient. All questions                         were answered and informed consent was obtained.                         Patient identification and proposed procedure were                         verified by the physician, the nurse, the                         anesthesiologist, the anesthetist and the technician                         in the endoscopy suite. Mental Status Examination:                         alert and oriented. Airway Examination: normal                         oropharyngeal airway and neck mobility. Respiratory                         Examination: clear to auscultation. CV Examination:                         normal. Prophylactic Antibiotics: The patient does not                         require prophylactic antibiotics. Prior  Anticoagulants: The patient has taken no previous                         anticoagulant or antiplatelet agents. ASA Grade                         Assessment: III - A patient with severe systemic                          disease. After reviewing the risks and benefits, the                         patient was deemed in satisfactory condition to                         undergo the procedure. The anesthesia plan was to use                         monitored anesthesia care (MAC). Immediately prior to                         administration of medications, the patient was                         re-assessed for adequacy to receive sedatives. The                         heart rate, respiratory rate, oxygen saturations,                         blood pressure, adequacy of pulmonary ventilation, and                         response to care were monitored throughout the                         procedure. The physical status of the patient was                         re-assessed after the procedure.                        After obtaining informed consent, the colonoscope was                         passed under direct vision. Throughout the procedure,                         the patient's blood pressure, pulse, and oxygen                         saturations were monitored continuously. The                         Colonoscope was introduced through the anus and                         advanced to the the terminal ileum. The colonoscopy  was performed without difficulty. The patient                         tolerated the procedure well. The quality of the bowel                         preparation was adequate to identify polyps. Findings:      The perianal and digital rectal examinations were normal.      The terminal ileum appeared normal.      Stigmata of recent bleeding was seen in the distal transverse colon,       secondary to previous polypectomy procedure. To close a defect after       polypectomy, two hemostatic clips were successfully placed. There was no       bleeding during, or at the end, of the procedure.      Area of previous polypectomy identified in recto-sigmoid colon.  No       active bleeding seen. To close a defect after polypectomy, two       hemostatic clips were successfully placed. There was no bleeding during,       or at the end, of the procedure.      A 3 mm polyp was found in the ascending colon. The polyp was sessile.       Polypectomy was not attempted due to due to concern for bleeding at       another site of colon.      A few small-mouthed diverticula were found in the sigmoid colon and       descending colon.      Internal hemorrhoids were found during retroflexion. The hemorrhoids       were Grade I (internal hemorrhoids that do not prolapse).      The exam was otherwise without abnormality on direct and retroflexion       views. Impression:            - The examined portion of the ileum was normal.                        - Bleeding in the distal transverse colon secondary to                         previous polypectomy. Clips were placed.                        - One 3 mm polyp in the ascending colon. Resection not                         attempted.                        - Diverticulosis in the sigmoid colon and in the                         descending colon.                        - Internal hemorrhoids.                        - The examination was otherwise normal on direct and  retroflexion views.                        - No specimens collected. Recommendation:        - Return patient to hospital ward for ongoing care.                        - Clear liquid diet.                        - Continue present medications.                        - Repeat colonoscopy in 6 months for removal of small                         polyp in ascending colon.                        - Return to referring physician as previously                         scheduled. Procedure Code(s):     --- Professional ---                        [phone], Colonoscopy, flexible; diagnostic, including                         collection of  specimen(s) by brushing or washing, when                         performed (separate procedure) Diagnosis Code(s):     --- Professional ---                        K64.0, First degree hemorrhoids                        K91.840, Postprocedural hemorrhage of a digestive                         system organ or structure following a digestive system                         procedure                        K63.5, Polyp of colon                        K92.1, Melena (includes Hematochezia)                        K57.30, Diverticulosis of large intestine without                         perforation or abscess without bleeding CPT copyright 2019 American Medical Association. All rights reserved. The codes documented in this report are preliminary and upon coder review may  be revised to meet current compliance requirements. Andrey Farmer MD, MD 10/06/2021 1:13:58 PM Number of Addenda: 0 Note Initiated On: 10/06/2021 10:56 AM Scope Withdrawal Time: 0 hours 11  minutes 33 seconds  Total Procedure Duration: 0 hours 14 minutes 51 seconds  Estimated Blood Loss:  Estimated blood loss: none.      [facility]

## 2021-10-06 NOTE — TOC Progression Note (Addendum)
Transition of Care [facility]) - Progression Note    Patient Details  Name: [name] [name] MRN: [id number] Date of Birth: [birth year]  Transition of Care [facility]) CM/SW Contact  Izola Price, RN Phone Number: 10/06/2021, 9:40 AM  Clinical Narrative: 2/11:  Substance abuse information given to patient per [facility] consult request. Per provider, likely to dc after repeat colonoscopy today for bloody stools reported this am Simmie Davies RN CM   2/11: 2:11 pm. DC orders in post colonoscopy. No TOC needs identified in discharge plan to home/self care. Simmie Davies RN CM        Expected Discharge Plan and Services                                                 Social Determinants of Health (SDOH) Interventions    Readmission Risk Interventions No flowsheet data found.

## 2021-10-06 NOTE — Hospital Course (Signed)
[name] [name] is a 61 y.o. male with medical history significant of hypertension, hyperlipidemia, GERD, alcohol abuse, CAD, who presented on 10/05/2021 with rectal bleeding after having had a colonoscopy by Dr. [name] of GI the day before with multiple polyps removed. Pt reported about 7 episodes of rectal bleeding after returning home.  When he called the GI clinic, it was recommended he come to the ED for further evaluation. Pt was seen by Dr. [name] and sigmoidoscopy was performed which showed that bleeding above the sigmoid colon.  Colonoscopy planned for 2/11 after bowel prep.  2/11: Hbg fairly stable 10.7

## 2021-10-06 NOTE — Anesthesia Preprocedure Evaluation (Signed)
Anesthesia Evaluation  Patient identified by MRN, date of birth, ID band Patient awake    Reviewed: Allergy & Precautions, NPO status , [name] Chart, lab work & pertinent test results  History of Anesthesia Complications Negative for: history of anesthetic complications  Airway Mallampati: III  TM Distance: <3 FB Neck ROM: full    Dental  (+) Chipped, Dental Advidsory Given   Pulmonary neg shortness of breath, Patient abstained from smoking.,    Pulmonary exam normal        Cardiovascular Exercise Tolerance: Good hypertension, (-) angina+ CAD  Normal cardiovascular exam+ dysrhythmias      Neuro/Psych  Headaches,  Neuromuscular disease negative psych ROS   GI/Hepatic Neg liver ROS, GERD  Controlled,  Endo/Other  negative endocrine ROS  Renal/GU negative Renal ROS  negative genitourinary   Musculoskeletal  (+) Arthritis ,   Abdominal   Peds  Hematology negative hematology ROS (+)   Anesthesia Other Findings Past Medical History: No date: Alcohol use     Comment:  regularly 3 beers/day No date: CAD (coronary artery disease)     Comment:  mild No date: Dyslipidemia No date: GERD (gastroesophageal reflux disease)     Comment:  with esophagitis No date: History of Helicobacter pylori infection No date: Hypertension, essential 01/19/2016: Obesity, Class I, BMI 30-34.9  Past Surgical History: 2012: ANTERIOR CERVICAL DECOMP/DISCECTOMY FUSION     Comment:  C4-5 ([place]) 03/12/2018: ANTERIOR CERVICAL DECOMP/DISCECTOMY FUSION; N/A     Comment:  ACDF C4-6, EXPLORATION OF FUSION C6-7, REMOVAL OF               HARDWARE;  [name], Duane Lope, MD) 10/20/2019: ARTERY BIOPSY; Left     Comment:  TEMPORAL ARTERY BIOPSY - Schnier, Dolores Lory, MD 2013: CARDIAC CATHETERIZATION     Comment:  no significant CAD, nl LVEF 60% [name]) 2012: COLONOSCOPY     Comment:  WNL per PCP report 2015: CT CTA CORONARY W/CA SCORE W/CM &/OR WO/CM      Comment:  minor luminal irregularities, Ca score = 90 [name]) No date: KNEE ARTHROSCOPY; Bilateral 07/31/2021: LEFT HEART CATH AND CORONARY ANGIOGRAPHY; N/A     Comment:  Procedure: LEFT HEART CATH AND CORONARY ANGIOGRAPHY;                Surgeon: [name], MD;  Location: [place]               CV LAB;  Service: Cardiovascular;  Laterality: N/A; 2019: ROTATOR CUFF REPAIR; Right No date: SCLEROTHERAPY     Comment:  legs  BMI    Body Mass Index: 38.01 kg/m      Reproductive/Obstetrics negative OB ROS                             Anesthesia Physical  Anesthesia Plan  ASA: 3  Anesthesia Plan: General   Post-op Pain Management:    Induction: Intravenous  PONV Risk Score and Plan: Propofol infusion and TIVA  Airway Management Planned: Natural Airway and Nasal Cannula  Additional Equipment:   Intra-op Plan:   Post-operative Plan:   Informed Consent: I have reviewed the patients History and Physical, chart, labs and discussed the procedure including the risks, benefits and alternatives for the proposed anesthesia with the patient or authorized representative who has indicated his/her understanding and acceptance.     Dental Advisory Given  Plan Discussed with: Anesthesiologist, CRNA and Surgeon  Anesthesia Plan Comments: (Patient consented  for risks of anesthesia including but not limited to:  - adverse reactions to medications - risk of airway placement if required - damage to eyes, teeth, lips or other oral mucosa - nerve damage due to positioning  - sore throat or hoarseness - Damage to heart, brain, nerves, lungs, other parts of body or loss of life  Patient voiced understanding.)        Anesthesia Quick Evaluation

## 2021-10-07 NOTE — Anesthesia Postprocedure Evaluation (Signed)
Anesthesia Post Note  Patient: [name] [name]  Procedure(s) Performed: COLONOSCOPY WITH PROPOFOL  Patient location during evaluation: Endoscopy Anesthesia Type: General Level of consciousness: awake and alert Pain management: pain level controlled Vital Signs Assessment: post-procedure vital signs reviewed and stable Respiratory status: spontaneous breathing, nonlabored ventilation, respiratory function stable and patient connected to nasal cannula oxygen Cardiovascular status: blood pressure returned to baseline and stable Postop Assessment: no apparent nausea or vomiting Anesthetic complications: no   No notable events documented.   Last Vitals:  Vitals:   10/06/21 1327 10/06/21 1359  BP: (!) 161/79 (!) 155/76  Pulse: 75 70  Resp: (!) 22 18  Temp:  36.6 C  SpO2: 99% 99%    Last Pain:  Vitals:   10/06/21 1359  TempSrc: Oral  PainSc: 0-No pain                 Martha Clan

## 2021-10-08 ENCOUNTER — Encounter: Payer: Self-pay | Admitting: Gastroenterology

## 2021-10-11 ENCOUNTER — Other Ambulatory Visit: Payer: Self-pay

## 2021-10-11 MED ORDER — OMEPRAZOLE 20 MG PO CPDR: DELAYED_RELEASE_CAPSULE | ORAL | 3 refills | Status: DC

## 2022-02-23 HISTORY — PX: REPLACEMENT TOTAL KNEE: SUR1224

## 2022-03-01 ENCOUNTER — Telehealth: Payer: Self-pay

## 2022-03-01 NOTE — Telephone Encounter (Signed)
Patient called in stating that [facility] is going to fax over a clearance for surgery, the patient is scheduled for 7/31. Due to the next available appointment being the 17th, patient wants to know if Dr.G will fill out paperwork without an appointment.

## 2022-03-01 NOTE — Telephone Encounter (Signed)
Received faxed pre-op form from [facility].  Pt has pre-op eval scheduled on 03/25/22 at 3:30.  Spoke with pt notifying him he needs to be seen due to labs and other tests that need to be done.  Pt verbalizes understanding.  [Form in basket on Lisa's desk.]

## 2022-03-11 ENCOUNTER — Encounter: Payer: Self-pay | Admitting: Family Medicine

## 2022-03-11 ENCOUNTER — Ambulatory Visit (INDEPENDENT_AMBULATORY_CARE_PROVIDER_SITE_OTHER)
Admission: RE | Admit: 2022-03-11 | Discharge: 2022-03-11 | Disposition: A | Discharge status: Home / Self Care | Payer: 59 | Source: Ambulatory Visit | Attending: Family Medicine | Admitting: Family Medicine

## 2022-03-11 ENCOUNTER — Ambulatory Visit (INDEPENDENT_AMBULATORY_CARE_PROVIDER_SITE_OTHER): Payer: 59 | Admitting: Family Medicine

## 2022-03-11 VITALS — BP 160/96 | HR 83 | Temp 97.9°F | Ht 68.0 in | Wt 255.0 lb

## 2022-03-11 DIAGNOSIS — K219 Gastro-esophageal reflux disease without esophagitis: Secondary | ICD-10-CM

## 2022-03-11 DIAGNOSIS — I2510 Atherosclerotic heart disease of native coronary artery without angina pectoris: Secondary | ICD-10-CM | POA: Diagnosis not present

## 2022-03-11 DIAGNOSIS — R7303 Prediabetes: Secondary | ICD-10-CM | POA: Diagnosis not present

## 2022-03-11 DIAGNOSIS — I10 Essential (primary) hypertension: Secondary | ICD-10-CM

## 2022-03-11 DIAGNOSIS — Z01818 Encounter for other preprocedural examination: Secondary | ICD-10-CM

## 2022-03-11 MED ORDER — LOSARTAN POTASSIUM 25 MG PO TABS: 25.0000 mg | ORAL_TABLET | Freq: Every day | ORAL | 3 refills | Status: DC

## 2022-03-11 NOTE — Progress Notes (Unsigned)
Patient ID: [name] [name], male    DOB: Jan 21, 1961, 61 y.o.   MRN: [id number]  This visit was conducted in person.  BP (!) 160/96 (BP Location: Right Arm, Cuff Size: Large)   Pulse 83   Temp 97.9 F (36.6 C) (Temporal)   Ht '5\' 8"'$  (1.727 m)   Wt 255 lb (115.7 kg)   SpO2 96%   BMI 38.77 kg/m   BP Readings from Last 3 Encounters:  03/11/22 (!) 160/96  10/06/21 (!) 155/76  10/04/21 (!) 141/91   CC: preop eval  Subjective:   HPI: [name] [name] is a 61 y.o. male presenting on 03/11/2022 for Pre-op Exam (Pt scheduled for R TKA on 03/25/22. [facility] faxed form. )   [name] [name]  has a past medical history of Alcohol use, CAD (coronary artery disease), Dyslipidemia, GERD (gastroesophageal reflux disease), History of Helicobacter pylori infection, Hypertension, essential, and Obesity, Class I, BMI 30-34.9 (01/19/2016).  Planned upcoming R total knee replacement through [facility] under spinal anesthesia.   Also had R hip MRI yesterday for ongoing hip pain.   Colonoscopy 12/4648 complicated by rectal bleeding after polypectomy.   Patient has tolerated anesthesia well in the past.  Latest surgical intervention was RTC repair 2019.  Denies trouble with post-op nausea/vomiting, or trouble awakening after surgery.   Sees cardiology Dr Nehemiah Massed last seen 10/2021. Will receive cardiac clearance through them. Had cardiac catheterization 07/2021 - overall reassuring.  LHC 07/2021 - 20% LAD stenosis, otherwise reassuring evaluation.   Prior on chlorthalidone, stopped due to dizziness. Has not been checking since.   Denies chest pain, dyspnea, palpitations, leg swelling, HA, dizziness.  No fevers/chills, coughing, abd pain, diarrhea.  No UTI symptoms of dysuria, urgency or frequency  Non smoker  No h/o asthma.      Relevant past medical, surgical, family and social history reviewed and updated as indicated. Interim medical history since our last visit reviewed. Allergies and medications  reviewed and updated. Outpatient Medications Prior to Visit  Medication Sig Dispense Refill   omeprazole (PRILOSEC) 20 MG capsule TAKE 1 CAPSULE(20 MG) BY MOUTH DAILY 90 capsule 3   atorvastatin (LIPITOR) 40 MG tablet TAKE 1 TABLET(40 MG) BY MOUTH DAILY AT 6 PM 90 tablet 3   atorvastatin (LIPITOR) 80 MG tablet Take 1 tablet (80 mg total) by mouth daily.     No facility-administered medications prior to visit.     Per HPI unless specifically indicated in ROS section below Review of Systems  Objective:  BP (!) 160/96 (BP Location: Right Arm, Cuff Size: Large)   Pulse 83   Temp 97.9 F (36.6 C) (Temporal)   Ht '5\' 8"'$  (1.727 m)   Wt 255 lb (115.7 kg)   SpO2 96%   BMI 38.77 kg/m   Wt Readings from Last 3 Encounters:  03/11/22 255 lb (115.7 kg)  10/05/21 252 lb 13.9 oz (114.7 kg)  10/04/21 250 lb (113.4 kg)      Physical Exam Vitals and nursing note reviewed.  Constitutional:      Appearance: Normal appearance. He is not ill-appearing.  HENT:     Head: Normocephalic and atraumatic.     Mouth/Throat:     Mouth: Mucous membranes are moist.     Pharynx: Oropharynx is clear. No oropharyngeal exudate or posterior oropharyngeal erythema.  Eyes:     Extraocular Movements: Extraocular movements intact.     Conjunctiva/sclera: Conjunctivae normal.     Pupils: Pupils are equal, round, and reactive to  light.  Cardiovascular:     Rate and Rhythm: Normal rate and regular rhythm.     Pulses: Normal pulses.     Heart sounds: Normal heart sounds. No murmur heard. Pulmonary:     Effort: Pulmonary effort is normal. No respiratory distress.     Breath sounds: Normal breath sounds. No wheezing, rhonchi or rales.  Musculoskeletal:     Right lower leg: No edema.     Left lower leg: No edema.  Skin:    General: Skin is warm and dry.     Findings: No rash.  Neurological:     Mental Status: He is alert.  Psychiatric:        Mood and Affect: Mood normal.        Behavior: Behavior normal.        Results for orders placed or performed in visit on 03/11/22  Comprehensive metabolic panel  Result Value Ref Range   Sodium 139 135 - 145 mEq/L   Potassium 4.4 3.5 - 5.1 mEq/L   Chloride 103 96 - 112 mEq/L   CO2 28 19 - 32 mEq/L   Glucose, Bld 104 (H) 70 - 99 mg/dL   BUN 12 6 - 23 mg/dL   Creatinine, Ser 0.78 0.40 - 1.50 mg/dL   Total Bilirubin 0.5 0.2 - 1.2 mg/dL   Alkaline Phosphatase 127 (H) 39 - 117 U/L   AST 22 0 - 37 U/L   ALT 35 0 - 53 U/L   Total Protein 7.2 6.0 - 8.3 g/dL   Albumin 4.5 3.5 - 5.2 g/dL   GFR 97.43 >60.00 mL/min   Calcium 9.6 8.4 - 10.5 mg/dL  Hemoglobin A1c  Result Value Ref Range   Hgb A1c MFr Bld 6.5 4.6 - 6.5 %  CBC with Differential/Platelet  Result Value Ref Range   WBC 7.3 4.0 - 10.5 K/uL   RBC 4.77 4.22 - 5.81 Mil/uL   Hemoglobin 14.6 13.0 - 17.0 g/dL   HCT 43.5 39.0 - 52.0 %   MCV 91.1 78.0 - 100.0 fl   MCHC 33.6 30.0 - 36.0 g/dL   RDW 14.7 11.5 - 15.5 %   Platelets 261.0 150.0 - 400.0 K/uL   Neutrophils Relative % 56.1 43.0 - 77.0 %   Lymphocytes Relative 32.7 12.0 - 46.0 %   Monocytes Relative 8.1 3.0 - 12.0 %   Eosinophils Relative 2.3 0.0 - 5.0 %   Basophils Relative 0.8 0.0 - 3.0 %   Neutro Abs 4.1 1.4 - 7.7 K/uL   Lymphs Abs 2.4 0.7 - 4.0 K/uL   Monocytes Absolute 0.6 0.1 - 1.0 K/uL   Eosinophils Absolute 0.2 0.0 - 0.7 K/uL   Basophils Absolute 0.1 0.0 - 0.1 K/uL   Lab Results  Component Value Date   CHOL 182 08/07/2021   HDL 36.70 (L) 08/07/2021   LDLCALC 117 (H) 08/07/2021   TRIG 145.0 08/07/2021   CHOLHDL 5 08/07/2021    DG Chest 2 View CLINICAL DATA:  Preop evaluation. Knee surgery scheduled 03/25/2022.  EXAM: CHEST - 2 VIEW  COMPARISON:  07/29/2021  FINDINGS: The heart size and mediastinal contours are within normal limits. Both lungs are clear. The visualized skeletal structures are unremarkable.  IMPRESSION: No active cardiopulmonary disease.  Electronically Signed   By: Nolon Nations M.D.    On: 03/12/2022 11:03  Assessment & Plan:   Problem List Items Addressed This Visit     GERD (gastroesophageal reflux disease)    Continues omeprazole '20mg'$  daily.  CAD (coronary artery disease)    Known CAD by CT scan. Sees cardiology Dr Nehemiah Massed, on high intensity statin. Cardiac clearance through him.       Relevant Medications   losartan (COZAAR) 25 MG tablet   atorvastatin (LIPITOR) 80 MG tablet   Hypertension, essential    Chronic, above goal. Previous intolerance to antihypertensives tried including chlorthalidone, isosorbide, metoprolol.  Will start low dose losartan '25mg'$  daily, I asked him to return in 2 wks for rpt BP check prior to surgery.       Relevant Medications   losartan (COZAAR) 25 MG tablet   atorvastatin (LIPITOR) 80 MG tablet   Severe obesity (BMI 35.0-39.9) with comorbidity ([place])   Pre-op evaluation - Primary    RCRI = 0 Cardiac clearance should come through [facility] who he last saw 10/2021.  Check CXR, labs.  BP uncontrolled - start losartan '25mg'$  as per above. RTC 03/20/2022 for BP recheck.  If hypertensive control improves, anticipate adequately low risk to proceed with planned intervention.      Relevant Orders   DG Chest 2 View (Completed)   Comprehensive metabolic panel (Completed)   Hemoglobin A1c (Completed)   CBC with Differential/Platelet (Completed)   Prediabetes    Update A1c.         Meds ordered this encounter  Medications   losartan (COZAAR) 25 MG tablet    Sig: Take 1 tablet (25 mg total) by mouth daily.    Dispense:  30 tablet    Refill:  3   Orders Placed This Encounter  Procedures   DG Chest 2 View    Standing Status:   Future    Number of Occurrences:   1    Standing Expiration Date:   03/12/2023    Order Specific Question:   Reason for Exam (SYMPTOM  OR DIAGNOSIS REQUIRED)    Answer:   preop eval    Order Specific Question:   Preferred imaging location?    Answer:   [facility]   Comprehensive metabolic panel   Hemoglobin A1c   CBC with Differential/Platelet    Patient instructions: Labs today Chest xray today  Blood pressure staying too high - start losartan '25mg'$  daily. Work on low salt/sodium diet - goal <2gm (2,o'00mg'$ ) per day. Eat a diet high in fruits/vegetables and whole grains.  Look into mediterranean and DASH diet. Look at [employer] for more resources  We will ask Dr Nehemiah Massed for cardiac clearance.   Follow up plan: Return in about 9 days (around 03/20/2022).  Ria Bush, MD

## 2022-03-11 NOTE — Patient Instructions (Addendum)
Labs today Chest xray today  Blood pressure staying too high - start losartan '25mg'$  daily. Work on low salt/sodium diet - goal <2gm (2,o'00mg'$ ) per day. Eat a diet high in fruits/vegetables and whole grains.  Look into mediterranean and DASH diet. Look at [place].org for more resources  We will ask Dr Nehemiah Massed for cardiac clearance.   DASH Eating Plan DASH stands for Dietary Approaches to Stop Hypertension. The DASH eating plan is a healthy eating plan that has been shown to: Reduce high blood pressure (hypertension). Reduce your risk for type 2 diabetes, heart disease, and stroke. Help with weight loss. What are tips for following this plan? Reading food labels Check food labels for the amount of salt (sodium) per serving. Choose foods with less than 5 percent of the Daily Value of sodium. Generally, foods with less than 300 milligrams (mg) of sodium per serving fit into this eating plan. To find whole grains, look for the word "whole" as the first word in the ingredient list. Shopping Buy products labeled as "low-sodium" or "no salt added." Buy fresh foods. Avoid canned foods and pre-made or frozen meals. Cooking Avoid adding salt when cooking. Use salt-free seasonings or herbs instead of table salt or sea salt. Check with your health care provider or pharmacist before using salt substitutes. Do not fry foods. Cook foods using healthy methods such as baking, boiling, grilling, roasting, and broiling instead. Cook with heart-healthy oils, such as olive, canola, avocado, soybean, or sunflower oil. Meal planning  Eat a balanced diet that includes: 4 or more servings of fruits and 4 or more servings of vegetables each day. Try to fill one-half of your plate with fruits and vegetables. 6-8 servings of whole grains each day. Less than 6 oz (170 g) of lean meat, poultry, or fish each day. A 3-oz (85-g) serving of meat is about the same size as a deck of cards. One egg equals 1 oz (28 g). 2-3  servings of low-fat dairy each day. One serving is 1 cup (237 mL). 1 serving of nuts, seeds, or beans 5 times each week. 2-3 servings of heart-healthy fats. Healthy fats called omega-3 fatty acids are found in foods such as walnuts, flaxseeds, fortified milks, and eggs. These fats are also found in cold-water fish, such as sardines, salmon, and mackerel. Limit how much you eat of: Canned or prepackaged foods. Food that is high in trans fat, such as some fried foods. Food that is high in saturated fat, such as fatty meat. Desserts and other sweets, sugary drinks, and other foods with added sugar. Full-fat dairy products. Do not salt foods before eating. Do not eat more than 4 egg yolks a week. Try to eat at least 2 vegetarian meals a week. Eat more home-cooked food and less restaurant, buffet, and fast food. Lifestyle When eating at a restaurant, ask that your food be prepared with less salt or no salt, if possible. If you drink alcohol: Limit how much you use to: 0-1 drink a day for women who are not pregnant. 0-2 drinks a day for men. Be aware of how much alcohol is in your drink. In the [place] one drink equals one 12 oz bottle of beer (355 mL), one 5 oz glass of wine (148 mL), or one 1 oz glass of hard liquor (44 mL). General information Avoid eating more than 2,300 mg of salt a day. If you have hypertension, you may need to reduce your sodium intake to 1,500 mg a day. Work  with your health care provider to maintain a healthy body weight or to lose weight. Ask what an ideal weight is for you. Get at least 30 minutes of exercise that causes your heart to beat faster (aerobic exercise) most days of the week. Activities may include walking, swimming, or biking. Work with your health care provider or dietitian to adjust your eating plan to your individual calorie needs. What foods should I eat? Fruits All fresh, dried, or frozen fruit. Canned fruit in natural juice (without added  sugar). Vegetables Fresh or frozen vegetables (raw, steamed, roasted, or grilled). Low-sodium or reduced-sodium tomato and vegetable juice. Low-sodium or reduced-sodium tomato sauce and tomato paste. Low-sodium or reduced-sodium canned vegetables. Grains Whole-grain or whole-wheat bread. Whole-grain or whole-wheat pasta. Brown rice. [place]. Bulgur. Whole-grain and low-sodium cereals. Pita bread. Low-fat, low-sodium crackers. Whole-wheat flour tortillas. Meats and other proteins Skinless chicken or [place]. Ground chicken or [place]. Pork with fat trimmed off. Fish and seafood. Egg whites. Dried beans, peas, or lentils. Unsalted nuts, nut butters, and seeds. Unsalted canned beans. Lean cuts of beef with fat trimmed off. Low-sodium, lean precooked or cured meat, such as sausages or meat loaves. Dairy Low-fat (1%) or fat-free (skim) milk. Reduced-fat, low-fat, or fat-free cheeses. Nonfat, low-sodium ricotta or cottage cheese. Low-fat or nonfat yogurt. Low-fat, low-sodium cheese. Fats and oils Soft margarine without trans fats. Vegetable oil. Reduced-fat, low-fat, or light mayonnaise and salad dressings (reduced-sodium). Canola, safflower, olive, avocado, soybean, and sunflower oils. Avocado. Seasonings and condiments Herbs. Spices. Seasoning mixes without salt. Other foods Unsalted popcorn and pretzels. Fat-free sweets. The items listed above may not be a complete list of foods and beverages you can eat. Contact a dietitian for more information. What foods should I avoid? Fruits Canned fruit in a light or heavy syrup. Fried fruit. Fruit in cream or butter sauce. Vegetables Creamed or fried vegetables. Vegetables in a cheese sauce. Regular canned vegetables (not low-sodium or reduced-sodium). Regular canned tomato sauce and paste (not low-sodium or reduced-sodium). Regular tomato and vegetable juice (not low-sodium or reduced-sodium). Angie Fava. Olives. Grains Baked goods made with fat, such  as croissants, muffins, or some breads. Dry pasta or rice meal packs. Meats and other proteins Fatty cuts of meat. Ribs. Fried meat. Berniece Salines. Bologna, salami, and other precooked or cured meats, such as sausages or meat loaves. Fat from the back of a pig (fatback). Bratwurst. Salted nuts and seeds. Canned beans with added salt. Canned or smoked fish. Whole eggs or egg yolks. Chicken or [place] with skin. Dairy Whole or 2% milk, cream, and half-and-half. Whole or full-fat cream cheese. Whole-fat or sweetened yogurt. Full-fat cheese. Nondairy creamers. Whipped toppings. Processed cheese and cheese spreads. Fats and oils Butter. Stick margarine. Lard. Shortening. Ghee. Bacon fat. Tropical oils, such as coconut, palm kernel, or palm oil. Seasonings and condiments Onion salt, garlic salt, seasoned salt, table salt, and sea salt. Worcestershire sauce. Tartar sauce. Barbecue sauce. Teriyaki sauce. Soy sauce, including reduced-sodium. Steak sauce. Canned and packaged gravies. Fish sauce. Oyster sauce. Cocktail sauce. Store-bought horseradish. Ketchup. Mustard. Meat flavorings and tenderizers. Bouillon cubes. Hot sauces. Pre-made or packaged marinades. Pre-made or packaged taco seasonings. Relishes. Regular salad dressings. Other foods Salted popcorn and pretzels. The items listed above may not be a complete list of foods and beverages you should avoid. Contact a dietitian for more information. Where to find more information National Heart, Lung, and Blood Institute: https://wilson-eaton.com/ American Heart Association: www.heart.org Academy of Nutrition and Dietetics: www.eatright.[place]: www.kidney.org  Summary The DASH eating plan is a healthy eating plan that has been shown to reduce high blood pressure (hypertension). It may also reduce your risk for type 2 diabetes, heart disease, and stroke. When on the DASH eating plan, aim to eat more fresh fruits and vegetables, whole grains, lean  proteins, low-fat dairy, and heart-healthy fats. With the DASH eating plan, you should limit salt (sodium) intake to 2,300 mg a day. If you have hypertension, you may need to reduce your sodium intake to 1,500 mg a day. Work with your health care provider or dietitian to adjust your eating plan to your individual calorie needs. This information is not intended to replace advice given to you by your health care provider. Make sure you discuss any questions you have with your health care provider. Document Revised: 07/16/2019 Document Reviewed: 07/16/2019 Elsevier Patient Education  [place].

## 2022-03-12 LAB — CBC WITH DIFFERENTIAL/PLATELET
Basophils Absolute: 0.1 10*3/uL (ref 0.0–0.1)
Basophils Relative: 0.8 % (ref 0.0–3.0)
Eosinophils Absolute: 0.2 10*3/uL (ref 0.0–0.7)
Eosinophils Relative: 2.3 % (ref 0.0–5.0)
HCT: 43.5 % (ref 39.0–52.0)
Hemoglobin: 14.6 g/dL (ref 13.0–17.0)
Lymphocytes Relative: 32.7 % (ref 12.0–46.0)
Lymphs Abs: 2.4 10*3/uL (ref 0.7–4.0)
MCHC: 33.6 g/dL (ref 30.0–36.0)
MCV: 91.1 fl (ref 78.0–100.0)
Monocytes Absolute: 0.6 10*3/uL (ref 0.1–1.0)
Monocytes Relative: 8.1 % (ref 3.0–12.0)
Neutro Abs: 4.1 10*3/uL (ref 1.4–7.7)
Neutrophils Relative %: 56.1 % (ref 43.0–77.0)
Platelets: 261 10*3/uL (ref 150.0–400.0)
RBC: 4.77 Mil/uL (ref 4.22–5.81)
RDW: 14.7 % (ref 11.5–15.5)
WBC: 7.3 10*3/uL (ref 4.0–10.5)

## 2022-03-12 LAB — COMPREHENSIVE METABOLIC PANEL
ALT: 35 U/L (ref 0–53)
AST: 22 U/L (ref 0–37)
Albumin: 4.5 g/dL (ref 3.5–5.2)
Alkaline Phosphatase: 127 U/L — ABNORMAL HIGH (ref 39–117)
BUN: 12 mg/dL (ref 6–23)
CO2: 28 mEq/L (ref 19–32)
Calcium: 9.6 mg/dL (ref 8.4–10.5)
Chloride: 103 mEq/L (ref 96–112)
Creatinine, Ser: 0.78 mg/dL (ref 0.40–1.50)
GFR: 97.43 mL/min (ref >60.00)
Glucose, Bld: 104 mg/dL — ABNORMAL HIGH (ref 70–99)
Potassium: 4.4 mEq/L (ref 3.5–5.1)
Sodium: 139 mEq/L (ref 135–145)
Total Bilirubin: 0.5 mg/dL (ref 0.2–1.2)
Total Protein: 7.2 g/dL (ref 6.0–8.3)

## 2022-03-12 LAB — HEMOGLOBIN A1C: Hgb A1c MFr Bld: 6.5 % (ref 4.6–6.5)

## 2022-03-12 NOTE — Assessment & Plan Note (Signed)
RCRI = 0 Cardiac clearance should come through Dr [facility] cardiology who he last saw 10/2021.  Check CXR, labs.  BP uncontrolled - start losartan '25mg'$  as per above. RTC 03/20/2022 for BP recheck.  If hypertensive control improves, anticipate adequately low risk to proceed with planned intervention.

## 2022-03-12 NOTE — Assessment & Plan Note (Signed)
Chronic, above goal. Previous intolerance to antihypertensives tried including chlorthalidone, isosorbide, metoprolol.  Will start low dose losartan '25mg'$  daily, I asked him to return in 2 wks for rpt BP check prior to surgery.

## 2022-03-12 NOTE — Assessment & Plan Note (Signed)
Update A1c ?

## 2022-03-12 NOTE — Assessment & Plan Note (Signed)
Known CAD by CT scan. Sees cardiology Dr Nehemiah Massed, on high intensity statin. Cardiac clearance through him.

## 2022-03-12 NOTE — Assessment & Plan Note (Signed)
Continues omeprazole 53m daily.

## 2022-03-13 NOTE — Telephone Encounter (Signed)
Faxed form and OV notes to [facility] at [phone].  Also, faxed to [facility] at [phone], per form request.

## 2022-03-20 ENCOUNTER — Encounter: Payer: Self-pay | Admitting: Family Medicine

## 2022-03-20 ENCOUNTER — Ambulatory Visit (INDEPENDENT_AMBULATORY_CARE_PROVIDER_SITE_OTHER): Payer: 59 | Admitting: Family Medicine

## 2022-03-20 DIAGNOSIS — I10 Essential (primary) hypertension: Secondary | ICD-10-CM | POA: Diagnosis not present

## 2022-03-20 MED ORDER — LOSARTAN POTASSIUM 25 MG PO TABS: 25.0000 mg | ORAL_TABLET | Freq: Every day | ORAL | 1 refills | Status: DC

## 2022-03-20 NOTE — Patient Instructions (Signed)
Blood pressures are looking great today! Continue losartan '25mg'$  daily. Continue limiting salt in the diet. Work on increased water. We will forward today's note to surgeon's office.

## 2022-03-20 NOTE — Assessment & Plan Note (Signed)
Marked improvement on low dose losartan '25mg'$  daily - continue this. Will forward today's note to [facility] office.

## 2022-03-20 NOTE — Progress Notes (Signed)
Patient ID: [name] [name], male    DOB: 04/01/1961, 61 y.o.   MRN: [id number]  This visit was conducted in person.  BP 122/66   Pulse (!) 104   Temp 98.1 F (36.7 C) (Temporal)   Ht '5\' 8"'$  (1.727 m)   Wt 257 lb 2 oz (116.6 kg)   SpO2 98%   BMI 39.10 kg/m    CC: HTN f/u visit  Subjective:   HPI: [name] [name] is a 61 y.o. male presenting on 03/20/2022 for Hypertension (Here for 9 day f/u.)   See prior note for details.  Here for HTN f/u prior to planned R knee replacement by [facility] on Monday.   HTN - Compliant with current antihypertensive regimen of losartan '25mg'$  daily. Does not check blood pressures at home: no cuff.  No low blood pressure readings or symptoms of dizziness/syncope.  Denies HA, vision changes, CP/tightness, SOB, leg swelling.    Surgery scheduled for Monday.      Relevant past medical, surgical, family and social history reviewed and updated as indicated. Interim medical history since our last visit reviewed. Allergies and medications reviewed and updated. Outpatient Medications Prior to Visit  Medication Sig Dispense Refill   atorvastatin (LIPITOR) 80 MG tablet Take 1 tablet (80 mg total) by mouth daily.     omeprazole (PRILOSEC) 20 MG capsule TAKE 1 CAPSULE(20 MG) BY MOUTH DAILY 90 capsule 3   losartan (COZAAR) 25 MG tablet Take 1 tablet (25 mg total) by mouth daily. 30 tablet 3   No facility-administered medications prior to visit.     Per HPI unless specifically indicated in ROS section below Review of Systems  Objective:  BP 122/66   Pulse (!) 104   Temp 98.1 F (36.7 C) (Temporal)   Ht '5\' 8"'$  (1.727 m)   Wt 257 lb 2 oz (116.6 kg)   SpO2 98%   BMI 39.10 kg/m   Wt Readings from Last 3 Encounters:  03/20/22 257 lb 2 oz (116.6 kg)  03/11/22 255 lb (115.7 kg)  10/05/21 252 lb 13.9 oz (114.7 kg)      Physical Exam Vitals and nursing note reviewed.  Constitutional:      Appearance: Normal appearance. He is not ill-appearing.   Cardiovascular:     Rate and Rhythm: Regular rhythm. Tachycardia present.     Pulses: Normal pulses.     Heart sounds: Normal heart sounds. No murmur heard.    Comments: Mild Pulmonary:     Effort: Pulmonary effort is normal. No respiratory distress.     Breath sounds: Normal breath sounds. No wheezing or rales.  Musculoskeletal:        General: Normal range of motion.     Right lower leg: No edema.     Left lower leg: No edema.  Skin:    Findings: No rash.  Neurological:     Mental Status: He is alert.  Psychiatric:        Mood and Affect: Mood normal.        Behavior: Behavior normal.        Assessment & Plan:   Problem List Items Addressed This Visit     Hypertension, essential    Marked improvement on low dose losartan '25mg'$  daily - continue this. Will forward today's note to [facility] office.       Relevant Medications   losartan (COZAAR) 25 MG tablet     Meds ordered this encounter  Medications   losartan (COZAAR)  25 MG tablet    Sig: Take 1 tablet (25 mg total) by mouth daily.    Dispense:  90 tablet    Refill:  1   No orders of the defined types were placed in this encounter.    Patient Instructions  Blood pressures are looking great today! Continue losartan '25mg'$  daily. Continue limiting salt in the diet. Work on increased water. We will forward today's note to surgeon's office.   Follow up plan: Return if symptoms worsen or fail to improve.  Ria Bush, MD

## 2022-04-10 ENCOUNTER — Telehealth: Payer: Self-pay

## 2022-04-10 NOTE — Telephone Encounter (Signed)
Patients wife has asked if Dr. Allen Norris would be able to perform her husbands colonoscopy.  The last colonoscopy was performed by Dr. Haig Prophet 10/06/21 who noted for patient to have repeat colonoscopy in 6 months "Repeat colonoscopy in 6 months for removal of small polyp in ascending colon".  Prior to this she stated her husband had a bad experience with Dr. [name]. She stated that Dr. [name] scoped her husband 3 times and had a really bad experience with her.  Please advise as to if we would be able to do his repeat colonoscopy as the most recent was with Dr. Haig Prophet 02/23.  Thanks, [employer]

## 2022-04-11 ENCOUNTER — Telehealth: Payer: Self-pay

## 2022-04-11 ENCOUNTER — Telehealth: Payer: Self-pay | Admitting: Family Medicine

## 2022-04-11 DIAGNOSIS — Z86010 Personal history of colonic polyps: Secondary | ICD-10-CM

## 2022-04-11 NOTE — Telephone Encounter (Signed)
Patient wife called and stated that they need another referral done, and they would like to be at [facility] at [facility], phone number [phone].  Call back number  [phone].

## 2022-04-11 NOTE — Telephone Encounter (Signed)
Due for repeat colonoscopy and wants to get this done with Dr Allen Norris at [place]

## 2022-04-11 NOTE — Telephone Encounter (Signed)
Patients wife [name] [name] has been informed that Dr. Allen Norris said yes he will be able to perform patients colonoscopy.  Thanks,  [place], [place]

## 2022-04-12 ENCOUNTER — Telehealth: Payer: Self-pay

## 2022-04-12 ENCOUNTER — Other Ambulatory Visit: Payer: Self-pay

## 2022-04-12 DIAGNOSIS — Z86010 Personal history of colonic polyps: Secondary | ICD-10-CM

## 2022-04-12 MED ORDER — SUTAB 1479-225-188 MG PO TABS: 12.0000 | ORAL_TABLET | Freq: Once | ORAL | 0 refills | Status: AC

## 2022-04-12 NOTE — Telephone Encounter (Signed)
New referral placed.

## 2022-04-12 NOTE — Telephone Encounter (Signed)
Gastroenterology Pre-Procedure Review  Request Date: 05/14/2022 Requesting Physician: Dr. [name]   PATIENT REVIEW QUESTIONS: The patient responded to the following health history questions as indicated:    1. Are you having any GI issues? no 2. Do you have a personal history of Polyps? Yes Had polyps removed 6 months ago and needed to repeat in 6 months  3. Do you have a family history of Colon Cancer or Polyps? no 4. Diabetes Mellitus?  No  5. Joint replacements in the past 12 months?Yes had knee surgery on 04/01/2022 6. Major health problems in the past 3 months?no 7. Any artificial heart valves, MVP, or defibrillator?no    MEDICATIONS & ALLERGIES:    Patient reports the following regarding taking any anticoagulation/antiplatelet therapy:   Plavix, Coumadin, Eliquis, Xarelto, Lovenox, Pradaxa, Brilinta, or Effient? no Aspirin? Yes Asprin '81mg'$    Patient confirms/reports the following medications:  Current Outpatient Medications  Medication Sig Dispense Refill   atorvastatin (LIPITOR) 80 MG tablet Take 1 tablet (80 mg total) by mouth daily.     losartan (COZAAR) 25 MG tablet Take 1 tablet (25 mg total) by mouth daily. 90 tablet 1   omeprazole (PRILOSEC) 20 MG capsule TAKE 1 CAPSULE(20 MG) BY MOUTH DAILY 90 capsule 3   No current facility-administered medications for this visit.    Patient confirms/reports the following allergies:  Allergies  Allergen Reactions   Isosorbide Nitrate Other (See Comments) and Shortness Of Breath   Metoprolol Tartrate Other (See Comments) and Shortness Of Breath   Chlorthalidone Other (See Comments)    Dizziness, palpitations    No orders of the defined types were placed in this encounter.   AUTHORIZATION INFORMATION Primary Insurance: 1D#: Group #:  Secondary Insurance: 1D#: Group #:  SCHEDULE INFORMATION: Date:  Time: Location:

## 2022-04-26 ENCOUNTER — Other Ambulatory Visit: Payer: Self-pay

## 2022-04-26 ENCOUNTER — Telehealth: Payer: Self-pay | Admitting: Family Medicine

## 2022-04-26 ENCOUNTER — Emergency Department: Payer: 59

## 2022-04-26 ENCOUNTER — Emergency Department
Admission: EM | Admit: 2022-04-26 | Discharge: 2022-04-27 | Discharge status: Against Medical Advice | Payer: 59 | Attending: Emergency Medicine | Admitting: Emergency Medicine

## 2022-04-26 DIAGNOSIS — R1031 Right lower quadrant pain: Secondary | ICD-10-CM | POA: Diagnosis not present

## 2022-04-26 DIAGNOSIS — R112 Nausea with vomiting, unspecified: Secondary | ICD-10-CM | POA: Insufficient documentation

## 2022-04-26 DIAGNOSIS — Z5321 Procedure and treatment not carried out due to patient leaving prior to being seen by health care provider: Secondary | ICD-10-CM | POA: Insufficient documentation

## 2022-04-26 DIAGNOSIS — R197 Diarrhea, unspecified: Secondary | ICD-10-CM | POA: Diagnosis not present

## 2022-04-26 LAB — CBC WITH DIFFERENTIAL/PLATELET
Abs Immature Granulocytes: 0.05 10*3/uL (ref 0.00–0.07)
Basophils Absolute: 0 10*3/uL (ref 0.0–0.1)
Basophils Relative: 0 %
Eosinophils Absolute: 0 10*3/uL (ref 0.0–0.5)
Eosinophils Relative: 0 %
HCT: 42.7 % (ref 39.0–52.0)
Hemoglobin: 14.2 g/dL (ref 13.0–17.0)
Immature Granulocytes: 0 %
Lymphocytes Relative: 6 %
Lymphs Abs: 0.8 10*3/uL (ref 0.7–4.0)
MCH: 30.7 pg (ref 26.0–34.0)
MCHC: 33.3 g/dL (ref 30.0–36.0)
MCV: 92.4 fL (ref 80.0–100.0)
Monocytes Absolute: 0.9 10*3/uL (ref 0.1–1.0)
Monocytes Relative: 7 %
Neutro Abs: 11.4 10*3/uL — ABNORMAL HIGH (ref 1.7–7.7)
Neutrophils Relative %: 87 %
Platelets: 257 10*3/uL (ref 150–400)
RBC: 4.62 MIL/uL (ref 4.22–5.81)
RDW: 13.7 % (ref 11.5–15.5)
WBC: 13.2 10*3/uL — ABNORMAL HIGH (ref 4.0–10.5)
nRBC: 0 % (ref 0.0–0.2)

## 2022-04-26 LAB — COMPREHENSIVE METABOLIC PANEL
ALT: 31 U/L (ref 0–44)
AST: 22 U/L (ref 15–41)
Albumin: 4.5 g/dL (ref 3.5–5.0)
Alkaline Phosphatase: 124 U/L (ref 38–126)
Anion gap: 11 (ref 5–15)
BUN: 25 mg/dL — ABNORMAL HIGH (ref 6–20)
CO2: 26 mmol/L (ref 22–32)
Calcium: 9.7 mg/dL (ref 8.9–10.3)
Chloride: 106 mmol/L (ref 98–111)
Creatinine, Ser: 1 mg/dL (ref 0.61–1.24)
GFR, Estimated: >60 mL/min (ref >60)
Glucose, Bld: 108 mg/dL — ABNORMAL HIGH (ref 70–99)
Potassium: 3.9 mmol/L (ref 3.5–5.1)
Sodium: 143 mmol/L (ref 135–145)
Total Bilirubin: 0.9 mg/dL (ref 0.3–1.2)
Total Protein: 7.8 g/dL (ref 6.5–8.1)

## 2022-04-26 LAB — URINALYSIS, ROUTINE W REFLEX MICROSCOPIC
Bacteria, UA: NONE SEEN
Bilirubin Urine: NEGATIVE
Glucose, UA: NEGATIVE mg/dL
Ketones, ur: NEGATIVE mg/dL
Leukocytes,Ua: NEGATIVE
Nitrite: NEGATIVE
Protein, ur: 30 mg/dL — AB
Specific Gravity, Urine: 1.028 (ref 1.005–1.030)
Squamous Epithelial / HPF: NONE SEEN (ref 0–5)
pH: 5 (ref 5.0–8.0)

## 2022-04-26 LAB — LIPASE, BLOOD: Lipase: 23 U/L (ref 11–51)

## 2022-04-26 MED ORDER — IOHEXOL 300 MG/ML  SOLN
100.0000 mL | Freq: Once | INTRAMUSCULAR | Status: AC | PRN
  Administered 2022-04-26: 100 mL via INTRAVENOUS

## 2022-04-26 NOTE — ED Provider Triage Note (Signed)
  Emergency Medicine Provider Triage Evaluation Note  JASTIN FORE , a 61 y.o.male,  was evaluated in triage.  Pt complains of right lower quadrant abdominal pain with nausea/vomiting/diarrhea.  This been occurring since 300 this morning.  Endorses some chills as well.  Reports a history of C. difficile, and states that this feels similar.  He has been on antibiotics for the past month following a recent surgery.   Review of Systems  Positive: Nausea, vomiting, diarrhea, right lower quadrant pain Negative: Denies fever, chest pain, headache  Physical Exam   Vitals:   04/26/22 1729  BP: (!) 189/91  Pulse: 60  Resp: 17  Temp: 98.4 F (36.9 C)  SpO2: 96%   Gen:   Awake, no distress   Resp:  Normal effort  MSK:   Moves extremities without difficulty  Other:    Medical Decision Making  Given the patient's initial medical screening exam, the following diagnostic evaluation has been ordered. The patient will be placed in the appropriate treatment space, once one is available, to complete the evaluation and treatment. I have discussed the plan of care with the patient and I have advised the patient that an ED physician or mid-level practitioner will reevaluate their condition after the test results have been received, as the results may give them additional insight into the type of treatment they may need.    Diagnostics: Labs, EKG, UA, abdominal CT  Treatments: none immediately   Teodoro Spray, [place] 04/26/22 1732

## 2022-04-26 NOTE — ED Triage Notes (Addendum)
Pt presents to ED with c/o of N/V/D since 0800 this morning. Pt states some chills. Pt states pain is more located RLQ, pt still has appendix.   Pt states HX of C-diff. Pt states he is currently taking ABX.

## 2022-04-26 NOTE — ED Notes (Signed)
Pt called multiple times without answer. Pt not in subwait as listed by previous shift. No found in dept or lobby. Multiple attempts to contact pt at numbers listed without success. No option to leave voicemail.

## 2022-04-26 NOTE — Telephone Encounter (Signed)
Called and spoke to pt's wife. She said he had C.Diff about 2-3 years ago. Recently had knee surgery and started with uncontrollable diarrhea this morning. She has been in contact with the surgeons office in hopes they can call something in. I advised her if he needed to go to the ER, to look at going to [place] due to a shorter wait time.

## 2022-04-26 NOTE — Telephone Encounter (Signed)
Patient wife [name] called in stating that [name] [name] has possible recurring C diff. She would like for someone to give her a call back as soon as they can. Thank you!

## 2022-04-27 ENCOUNTER — Telehealth: Payer: Self-pay | Admitting: Gastroenterology

## 2022-04-27 ENCOUNTER — Telehealth: Payer: 59

## 2022-04-27 ENCOUNTER — Ambulatory Visit: Admit: 2022-04-27 | Disposition: A | Discharge status: Other Acute Inpatient Hospital | Payer: 59

## 2022-04-27 MED ORDER — VANCOMYCIN HCL 125 MG PO CAPS: 125.0000 mg | ORAL_CAPSULE | Freq: Four times a day (QID) | ORAL | 0 refills | Status: AC

## 2022-04-27 NOTE — Telephone Encounter (Signed)
Pt used to see Dr [name] but apparently wants to switch to Dr Allen Norris whom he wishes for to do his colonoscopy in 2 weeks, went into ER yesterday with abdominal ;pain diarrhea- no stool tests were done and now calls me with worsening diarrhea , feels like C diff he had a few years back, he wasn't able to find any lab that runs stool test today , and due to long weekend I suggested we have only2 options , 1) go back to ER and get tested - he doesn't wish to go due to 8 hour wait times, urgentc are said he cannot get tested , 2) not the best option but I can send in vancomycin to treat empirically and if he doesn't feel better or gets worse he goes back to ER, he wished to get treated empirically.   Script sent in   Office please check on him on Tuesday   Dr Vicente Males

## 2022-04-27 NOTE — ED Notes (Signed)
Multiple attempts made again to contact pt at listed numbers without success.

## 2022-04-27 NOTE — ED Notes (Signed)
Spoke with pt via phone, explained need to verify that IV was removed upon his arrival. Pt verbalizes understanding and agreeable to come back to ED. Pt present in ED at this time. Verified by this nurse that pt has no IV placed at this time.

## 2022-04-28 ENCOUNTER — Ambulatory Visit: Payer: Self-pay

## 2022-04-28 ENCOUNTER — Telehealth: Payer: Self-pay | Admitting: Emergency Medicine

## 2022-04-28 ENCOUNTER — Other Ambulatory Visit: Payer: Self-pay

## 2022-04-28 ENCOUNTER — Emergency Department
Admission: EM | Admit: 2022-04-28 | Discharge: 2022-04-28 | Disposition: A | Discharge status: Home / Self Care | Payer: 59 | Attending: Emergency Medicine | Admitting: Emergency Medicine

## 2022-04-28 DIAGNOSIS — Z20822 Contact with and (suspected) exposure to covid-19: Secondary | ICD-10-CM | POA: Insufficient documentation

## 2022-04-28 DIAGNOSIS — N202 Calculus of kidney with calculus of ureter: Secondary | ICD-10-CM | POA: Insufficient documentation

## 2022-04-28 DIAGNOSIS — I2510 Atherosclerotic heart disease of native coronary artery without angina pectoris: Secondary | ICD-10-CM | POA: Diagnosis not present

## 2022-04-28 DIAGNOSIS — I10 Essential (primary) hypertension: Secondary | ICD-10-CM | POA: Insufficient documentation

## 2022-04-28 DIAGNOSIS — R197 Diarrhea, unspecified: Secondary | ICD-10-CM | POA: Insufficient documentation

## 2022-04-28 DIAGNOSIS — N200 Calculus of kidney: Secondary | ICD-10-CM

## 2022-04-28 LAB — C DIFFICILE QUICK SCREEN W PCR REFLEX
C Diff antigen: NEGATIVE
C Diff interpretation: NOT DETECTED
C Diff toxin: NEGATIVE

## 2022-04-28 LAB — COMPREHENSIVE METABOLIC PANEL
ALT: 32 U/L (ref 0–44)
AST: 28 U/L (ref 15–41)
Albumin: 4.2 g/dL (ref 3.5–5.0)
Alkaline Phosphatase: 115 U/L (ref 38–126)
Anion gap: 12 (ref 5–15)
BUN: 21 mg/dL — ABNORMAL HIGH (ref 6–20)
CO2: 26 mmol/L (ref 22–32)
Calcium: 9.2 mg/dL (ref 8.9–10.3)
Chloride: 102 mmol/L (ref 98–111)
Creatinine, Ser: 0.85 mg/dL (ref 0.61–1.24)
GFR, Estimated: >60 mL/min (ref >60)
Glucose, Bld: 121 mg/dL — ABNORMAL HIGH (ref 70–99)
Potassium: 3.6 mmol/L (ref 3.5–5.1)
Sodium: 140 mmol/L (ref 135–145)
Total Bilirubin: 1.3 mg/dL — ABNORMAL HIGH (ref 0.3–1.2)
Total Protein: 7.8 g/dL (ref 6.5–8.1)

## 2022-04-28 LAB — GASTROINTESTINAL PANEL BY PCR, STOOL (REPLACES STOOL CULTURE)

## 2022-04-28 LAB — CBC
HCT: 43.8 % (ref 39.0–52.0)
Hemoglobin: 14.3 g/dL (ref 13.0–17.0)
MCH: 30.4 pg (ref 26.0–34.0)
MCHC: 32.6 g/dL (ref 30.0–36.0)
MCV: 93.2 fL (ref 80.0–100.0)
Platelets: 248 10*3/uL (ref 150–400)
RBC: 4.7 MIL/uL (ref 4.22–5.81)
RDW: 13.4 % (ref 11.5–15.5)
WBC: 7.9 10*3/uL (ref 4.0–10.5)
nRBC: 0 % (ref 0.0–0.2)

## 2022-04-28 LAB — LIPASE, BLOOD: Lipase: 83 U/L — ABNORMAL HIGH (ref 11–51)

## 2022-04-28 LAB — RESP PANEL BY RT-PCR (FLU A&B, COVID) ARPGX2
Influenza A by PCR: NEGATIVE
Influenza B by PCR: NEGATIVE
SARS Coronavirus 2 by RT PCR: NEGATIVE

## 2022-04-28 MED ORDER — SODIUM CHLORIDE 0.9 % IV BOLUS
1000.0000 mL | Freq: Once | INTRAVENOUS | Status: AC
  Administered 2022-04-28: 1000 mL via INTRAVENOUS

## 2022-04-28 MED ORDER — DICYCLOMINE HCL 10 MG PO CAPS
10.0000 mg | ORAL_CAPSULE | Freq: Once | ORAL | Status: AC
  Administered 2022-04-28: 10 mg via ORAL
  Filled 2022-04-28: qty 1

## 2022-04-28 MED ORDER — KETOROLAC TROMETHAMINE 30 MG/ML IJ SOLN
30.0000 mg | Freq: Once | INTRAMUSCULAR | Status: AC
  Administered 2022-04-28: 30 mg via INTRAVENOUS
  Filled 2022-04-28: qty 1

## 2022-04-28 MED ORDER — ONDANSETRON 4 MG PO TBDP: 4.0000 mg | ORAL_TABLET | Freq: Three times a day (TID) | ORAL | 0 refills | Status: DC | PRN

## 2022-04-28 MED ORDER — ONDANSETRON HCL 4 MG/2ML IJ SOLN
4.0000 mg | Freq: Once | INTRAMUSCULAR | Status: AC
  Administered 2022-04-28: 4 mg via INTRAVENOUS
  Filled 2022-04-28: qty 2

## 2022-04-28 MED ORDER — LOPERAMIDE HCL 2 MG PO CAPS
2.0000 mg | ORAL_CAPSULE | Freq: Once | ORAL | Status: AC
  Administered 2022-04-28: 2 mg via ORAL
  Filled 2022-04-28: qty 1

## 2022-04-28 MED ORDER — DICYCLOMINE HCL 20 MG PO TABS: 20.0000 mg | ORAL_TABLET | Freq: Three times a day (TID) | ORAL | 0 refills | Status: DC

## 2022-04-28 MED ORDER — TAMSULOSIN HCL 0.4 MG PO CAPS
0.4000 mg | ORAL_CAPSULE | Freq: Every day | ORAL | 0 refills | Status: AC
Start: 2022-04-28 — End: 2022-05-12

## 2022-04-28 MED ORDER — OXYCODONE-ACETAMINOPHEN 5-325 MG PO TABS: 1.0000 | ORAL_TABLET | ORAL | 0 refills | Status: DC | PRN

## 2022-04-28 MED ORDER — OXYCODONE-ACETAMINOPHEN 5-325 MG PO TABS
1.0000 | ORAL_TABLET | Freq: Once | ORAL | Status: AC
  Administered 2022-04-28: 1 via ORAL
  Filled 2022-04-28: qty 1

## 2022-04-28 NOTE — ED Triage Notes (Signed)
Pt states that he was here Friday night, got labs and CT and then left before seeing the doctor- pt states he called the GI on call and they gave him vancomycin for cdiff which he has had before- pt states he is back today because the pain is still really bad and he got a call from the hospital yesterday afternoon asking if he wanted to come back in and get seen for the kidney stones they found on his CT

## 2022-04-28 NOTE — ED Notes (Signed)
Charge RN Note:  This RN called pt at this time. Was told to come back to the emergency department for treatment of a kidney stone. Pt LWBS before treatment. Pt states he was in pain and needed to come back in anyway due to the pain. Pt also states that his GI specialist would also like a c diff panel done, pt was placed on Vancomycin by his PCP via phone conversation, per Dr. Vicente Males previous note.

## 2022-04-28 NOTE — ED Notes (Signed)
Enteric precaution sign is on the door. Patient has urine specimen container and was given a hat to place in toilet for stool collection and given instructions for use. Wife is at bedside.

## 2022-04-28 NOTE — Discharge Instructions (Addendum)
Your stool sample is negative for C.difficile. You may discontinue your vancomycin. Take the prescription meds for nausea, abdominal cramping, and pain as needed. Start on an OTC probiotic for overall gut health. Increase fluid intake to replenish and promote passage of your right-sided kidney stone. Follow-up with Urology and GI medicine as discussed. Return to the ED as needed.

## 2022-04-28 NOTE — ED Provider Notes (Signed)
[facility] Emergency Department Provider Note     Event Date/Time   First MD Initiated Contact with Patient 04/28/22 1235     (approximate)   History   Abdominal Pain   HPI  Mahkai DUFFY [name] is a 61 y.o. male with a history of GERD, CAD, hypertension, and obesity as well as a history of H. pylori and C. difficile colitis, presents to the ED for evaluation of ongoing abdominal pain.  Patient was evaluated in the ED yesterday for complaints of RLQ pain and diarrhea, but left prior to being evaluated.  CT imaging confirmed a 2 mm UVJ stone on the right.  Patient with history of C. difficile did not have stool sample testing performed yesterday.  He subsequently followed up with his GI specialist Dr. [name], via telephone, who empirically called in a prescription for vancomycin.  He returns today for stool testing, and treatment of acute dental pain secondary to the nephrolithiasis.  TKR R 7/31 Cellulitis R lower leg  Previous Cefadroxil on 8/16 Diarrhea 9/1  Steroid injection R hip 8/29   Physical Exam   Triage Vital Signs: ED Triage Vitals  Enc Vitals Group     BP 04/28/22 1201 (!) 186/103     Pulse Rate 04/28/22 1201 76     Resp 04/28/22 1201 20     Temp 04/28/22 1201 97.9 F (36.6 C)     Temp Source 04/28/22 1201 Oral     SpO2 04/28/22 1201 98 %     Weight 04/28/22 1209 250 lb (113.4 kg)     Height 04/28/22 1209 '5\' 8"'$  (1.727 m)     Head Circumference --      Peak Flow --      Pain Score 04/28/22 1208 10     Pain Loc --      Pain Edu? --      Excl. in [place]? --     Most recent vital signs: Vitals:   04/28/22 1201  BP: (!) 186/103  Pulse: 76  Resp: 20  Temp: 97.9 F (36.6 C)  SpO2: 98%    General Awake, no distress. *** {**HEENT NCAT. PERRL. EOMI. No rhinorrhea. Mucous membranes are moist. **} CV:  Good peripheral perfusion. *** RESP:  Normal effort. *** ABD:  No distention. *** {**Other: **}   ED Results / Procedures / Treatments    Labs (all labs ordered are listed, but only abnormal results are displayed) Labs Reviewed  LIPASE, BLOOD - Abnormal; Notable for the following components:      Result Value   Lipase 83 (*)    All other components within normal limits  COMPREHENSIVE METABOLIC PANEL - Abnormal; Notable for the following components:   Glucose, Bld 121 (*)    BUN 21 (*)    Total Bilirubin 1.3 (*)    All other components within normal limits  GASTROINTESTINAL PANEL BY PCR, STOOL (REPLACES STOOL CULTURE)  C DIFFICILE QUICK SCREEN W PCR REFLEX    CBC  URINALYSIS, ROUTINE W REFLEX MICROSCOPIC     EKG  ***  RADIOLOGY  {**I personally viewed and evaluated these images as part of my medical decision making, as well as reviewing the written report by the radiologist.  ED Provider Interpretation: ***}  No results found.   PROCEDURES:  Critical Care performed: No  Procedures   MEDICATIONS ORDERED IN ED: Medications  sodium chloride 0.9 % bolus 1,000 mL (has no administration in time range)  ondansetron (ZOFRAN) injection 4  mg (has no administration in time range)  ketorolac (TORADOL) 30 MG/ML injection 30 mg (has no administration in time range)     IMPRESSION / MDM / ASSESSMENT AND PLAN / ED COURSE  I reviewed the triage vital signs and the nursing notes.                              Differential diagnosis includes, but is not limited to, ***  [name] presentation is most consistent with acute complicated illness / injury requiring diagnostic workup.  [name] diagnosis is consistent with ***. Patient will be discharged home with prescriptions for ***. Patient is to follow up with *** as needed or otherwise directed. Patient is given ED precautions to return to the ED for any worsening or new symptoms.     FINAL CLINICAL IMPRESSION(S) / ED DIAGNOSES   Final diagnoses:  Diarrhea of presumed infectious origin  Kidney stone     Rx / DC Orders   ED Discharge Orders      None        Note:  This document was prepared using Dragon voice recognition software and may include unintentional dictation errors.

## 2022-04-28 NOTE — Telephone Encounter (Signed)
Spoke with patient in regards to plan of care. States that he would return to the ED for treatment in the next 30 minutes

## 2022-05-01 ENCOUNTER — Ambulatory Visit (INDEPENDENT_AMBULATORY_CARE_PROVIDER_SITE_OTHER): Payer: 59 | Admitting: Urology

## 2022-05-01 ENCOUNTER — Ambulatory Visit
Admission: RE | Admit: 2022-05-01 | Discharge: 2022-05-01 | Disposition: A | Discharge status: Home / Self Care | Payer: 59 | Source: Ambulatory Visit | Attending: Urology | Admitting: Urology

## 2022-05-01 ENCOUNTER — Encounter: Payer: Self-pay | Admitting: Urology

## 2022-05-01 VITALS — BP 133/85 | HR 116 | Ht 68.0 in | Wt 250.0 lb

## 2022-05-01 DIAGNOSIS — N201 Calculus of ureter: Secondary | ICD-10-CM | POA: Diagnosis not present

## 2022-05-01 DIAGNOSIS — N132 Hydronephrosis with renal and ureteral calculous obstruction: Secondary | ICD-10-CM

## 2022-05-01 DIAGNOSIS — N23 Unspecified renal colic: Secondary | ICD-10-CM | POA: Diagnosis not present

## 2022-05-01 NOTE — Progress Notes (Signed)
05/01/2022 2:03 PM   [name] [name] [birth year] [id number]  Referring provider: No referring provider defined for this encounter.  Chief Complaint  Patient presents with   Nephrolithiasis    HPI: [name] [name] is a 61 y.o. male presents for follow-up of a recent ED visit for renal colic.  ED visit 04/26/2022 complaining of right lower quad abdominal pain with nausea, vomiting and diarrhea CT abdomen/pelvis ordered by triage which showed a 2 mm right distal ureteral calculus with mild hydronephrosis He left [name] to the long wait.  Was treated empirically by GI for C. Difficile Return to ED 04/28/2022 with persistent abdominal pain. Stool testing was negative for C. Difficile Complains of intermittent right lower quadrant abdominal pain which presently is mild + Nausea without vomiting No previous history of stone disease Presently taking tamsulosin   PMH: Past Medical History:  Diagnosis Date   Alcohol use    regularly 3 beers/day   CAD (coronary artery disease)    mild   Dyslipidemia    GERD (gastroesophageal reflux disease)    with esophagitis   History of Helicobacter pylori infection    Hypertension, essential    Obesity, Class I, BMI 30-34.9 01/19/2016    Surgical History: Past Surgical History:  Procedure Laterality Date   ANTERIOR CERVICAL DECOMP/DISCECTOMY FUSION  2012   C4-5 ([place])   ANTERIOR CERVICAL DECOMP/DISCECTOMY FUSION N/A 03/12/2018   ACDF C4-6, EXPLORATION OF FUSION C6-7, REMOVAL OF HARDWARE;  [name] [name], [name], MD)   ARTERY BIOPSY Left 10/20/2019   TEMPORAL ARTERY BIOPSY - [name], [name] Lory, MD   CARDIAC CATHETERIZATION  2013   no significant CAD, nl LVEF 60% [name])   COLONOSCOPY  2012   WNL per PCP report   COLONOSCOPY  10/06/2021   done for rectal bleed after polypectomy s/p clip, rec rpt colonoscopy 6 months to remove polyp seen ascending colon ([place])   COLONOSCOPY WITH PROPOFOL N/A 10/04/2021   TAx2, low grade dysplasia present,  hemorrhoids, diverticulosis, rpt 3 yrs [name] [name], [name] Due, MD)   COLONOSCOPY WITH PROPOFOL N/A 10/06/2021   Procedure: COLONOSCOPY WITH PROPOFOL;  Surgeon: [name] Rubenstein, MD;  Location: [facility];  Service: Endoscopy;  Laterality: N/A;   CT CTA CORONARY W/CA SCORE W/CM &/OR WO/CM  2015   minor luminal irregularities, Ca score = 90 [name])   FLEXIBLE SIGMOIDOSCOPY N/A 10/05/2021   Procedure: FLEXIBLE SIGMOIDOSCOPY;  Surgeon: [name] Landsman, MD;  Location: [facility];  Service: Gastroenterology;  Laterality: N/A;   KNEE ARTHROSCOPY Bilateral    LEFT HEART CATH AND CORONARY ANGIOGRAPHY N/A 07/31/2021   Procedure: LEFT HEART CATH AND CORONARY ANGIOGRAPHY;  Surgeon: [name] Skains, MD;  Location: [facility];  Service: Cardiovascular;  Laterality: N/A;   ROTATOR CUFF REPAIR Right 2019   SCLEROTHERAPY     legs    Home Medications:  Allergies as of 05/01/2022       Reactions   Isosorbide Nitrate Other (See Comments), Shortness Of Breath   Metoprolol Tartrate Other (See Comments), Shortness Of Breath   Chlorthalidone Other (See Comments)   Dizziness, palpitations        Medication List        Accurate as of May 01, 2022  2:03 PM. If you have any questions, ask your nurse or doctor.          STOP taking these medications    oxyCODONE-acetaminophen 5-325 MG tablet Commonly known as: Percocet Stopped by: Abbie Sons, MD  TAKE these medications    atorvastatin 80 MG tablet Commonly known as: LIPITOR Take 1 tablet (80 mg total) by mouth daily.   dicyclomine 20 MG tablet Commonly known as: BENTYL Take 1 tablet (20 mg total) by mouth 3 (three) times daily before meals for 10 days.   HYDROcodone-acetaminophen 5-325 MG tablet Commonly known as: NORCO/VICODIN Take 1 tablet by mouth every 6 (six) hours as needed.   losartan 25 MG tablet Commonly known as: COZAAR Take 1 tablet (25 mg total) by mouth daily.   omeprazole 20 MG  capsule Commonly known as: PRILOSEC TAKE 1 CAPSULE(20 MG) BY MOUTH DAILY   ondansetron 4 MG disintegrating tablet Commonly known as: ZOFRAN-ODT Take 1 tablet (4 mg total) by mouth every 8 (eight) hours as needed for nausea or vomiting.   Sutab [phone] MG Tabs Generic drug: Sodium Sulfate-Mag Sulfate-KCl TAKE 12 TABLETS BY MOUTH FOR 1 DOSE AS DIRECTED   tamsulosin 0.4 MG Caps capsule Commonly known as: FLOMAX Take 1 capsule (0.4 mg total) by mouth daily after supper for 14 days.   vancomycin 125 MG capsule Commonly known as: VANCOCIN Take 1 capsule (125 mg total) by mouth 4 (four) times daily for 14 days.        Allergies:  Allergies  Allergen Reactions   Isosorbide Nitrate Other (See Comments) and Shortness Of Breath   Metoprolol Tartrate Other (See Comments) and Shortness Of Breath   Chlorthalidone Other (See Comments)    Dizziness, palpitations    Family History: Family History  Problem Relation Age of Onset   CAD Father 30       MI   Hyperlipidemia Father    Diabetes Paternal Grandfather    Cancer Mother 92       deceased from bone cancer   Stroke Neg Hx     Social History:  reports that he has never smoked. His smokeless tobacco use includes chew. He reports current alcohol use. He reports that he does not use drugs.   Physical Exam: BP 133/85   Pulse (!) 116   Ht '5\' 8"'$  (1.727 m)   Wt 250 lb (113.4 kg)   BMI 38.01 kg/m   Constitutional:  Alert and oriented, No acute distress. HEENT: [place] AT Respiratory: Normal respiratory effort, no increased work of breathing. Psychiatric: Normal mood and affect.  Laboratory Data:  Urinalysis Pending   Pertinent Imaging: CT images were personally reviewed and interpreted  Assessment & Plan:    1.  Right distal ureteral calculus Remains symptomatic Was empirically treated for C. difficile and testing was negative We discussed various treatment options for urolithiasis including observation with or  without medical expulsive therapy, shockwave lithotripsy (SWL), ureteroscopy and laser lithotripsy with stent placement. We discussed that management is based on stone size, location, density, patient co-morbidities, and patient preference.  Stones <78m in size have a >80% spontaneous passage rate. Data surrounding the use of tamsulosin for medical expulsive therapy is controversial, but meta analyses suggests it is most efficacious for distal stones between 5-127min size. Possible side effects include dizziness/lightheadedness, and retrograde ejaculation. SWL has a lower stone free rate in a single procedure, but also a lower complication rate compared to ureteroscopy and avoids a stent and associated stent related symptoms. Possible complications include renal hematoma, steinstrasse, and need for additional treatment. Ureteroscopy with laser lithotripsy and stent placement has a higher stone free rate than SWL in a single procedure, however increased complication rate including possible infection, ureteral injury, bleeding, and stent  related morbidity. Common stent related symptoms include dysuria, urgency/frequency, and flank pain. After discussing these options he would primarily be interested in SWL however a 2 mm stone may not be visualized on abdominal imaging.  He was sent for KUB and will be notified with results If the calculus is not visualized he requested ureteroscopy ASAP    Abbie Sons, [place] [address] [place], [place] [address] [phone]

## 2022-05-01 NOTE — H&P (View-Only) (Signed)
05/01/2022 2:03 PM   [name] [name] March 09, 1961 [id number]  Referring provider: No referring provider defined for this encounter.  Chief Complaint  Patient presents with   Nephrolithiasis    HPI: [name] [name] is a 61 y.o. male presents for follow-up of a recent ED visit for renal colic.  ED visit 04/26/2022 complaining of right lower quad abdominal pain with nausea, vomiting and diarrhea CT abdomen/pelvis ordered by triage which showed a 2 mm right distal ureteral calculus with mild hydronephrosis He left due to the long wait.  Was treated empirically by GI for C. Difficile Return to ED 04/28/2022 with persistent abdominal pain. Stool testing was negative for C. Difficile Complains of intermittent right lower quadrant abdominal pain which presently is mild + Nausea without vomiting No previous history of stone disease Presently taking tamsulosin   PMH: Past Medical History:  Diagnosis Date   Alcohol use    regularly 3 beers/day   CAD (coronary artery disease)    mild   Dyslipidemia    GERD (gastroesophageal reflux disease)    with esophagitis   History of Helicobacter pylori infection    Hypertension, essential    Obesity, Class I, BMI 30-34.9 01/19/2016    Surgical History: Past Surgical History:  Procedure Laterality Date   ANTERIOR CERVICAL DECOMP/DISCECTOMY FUSION  2012   C4-5 ([place])   ANTERIOR CERVICAL DECOMP/DISCECTOMY FUSION N/A 03/12/2018   ACDF C4-6, EXPLORATION OF FUSION C6-7, REMOVAL OF HARDWARE;  [name], [name], MD)   ARTERY BIOPSY Left 10/20/2019   TEMPORAL ARTERY BIOPSY - Schnier, Dolores Lory, MD   CARDIAC CATHETERIZATION  2013   no significant CAD, nl LVEF 60% [name])   COLONOSCOPY  2012   WNL per PCP report   COLONOSCOPY  10/06/2021   done for rectal bleed after polypectomy s/p clip, rec rpt colonoscopy 6 months to remove polyp seen ascending colon ([place])   COLONOSCOPY WITH PROPOFOL N/A 10/04/2021   TAx2, low grade dysplasia present,  hemorrhoids, diverticulosis, rpt 3 yrs [name], [name], MD)   COLONOSCOPY WITH PROPOFOL N/A 10/06/2021   Procedure: COLONOSCOPY WITH PROPOFOL;  Surgeon: [name], MD;  Location: [facility];  Service: Endoscopy;  Laterality: N/A;   CT CTA CORONARY W/CA SCORE W/CM &/OR WO/CM  2015   minor luminal irregularities, Ca score = 90 [name])   FLEXIBLE SIGMOIDOSCOPY N/A 10/05/2021   Procedure: FLEXIBLE SIGMOIDOSCOPY;  Surgeon: [name], MD;  Location: [facility];  Service: Gastroenterology;  Laterality: N/A;   KNEE ARTHROSCOPY Bilateral    LEFT HEART CATH AND CORONARY ANGIOGRAPHY N/A 07/31/2021   Procedure: LEFT HEART CATH AND CORONARY ANGIOGRAPHY;  Surgeon: [name], MD;  Location: [facility];  Service: Cardiovascular;  Laterality: N/A;   ROTATOR CUFF REPAIR Right 2019   SCLEROTHERAPY     legs    Home Medications:  Allergies as of 05/01/2022       Reactions   Isosorbide Nitrate Other (See Comments), Shortness Of Breath   Metoprolol Tartrate Other (See Comments), Shortness Of Breath   Chlorthalidone Other (See Comments)   Dizziness, palpitations        Medication List        Accurate as of May 01, 2022  2:03 PM. If you have any questions, ask your nurse or doctor.          STOP taking these medications    oxyCODONE-acetaminophen 5-325 MG tablet Commonly known as: Percocet Stopped by: Abbie Sons, MD  TAKE these medications    atorvastatin 80 MG tablet Commonly known as: LIPITOR Take 1 tablet (80 mg total) by mouth daily.   dicyclomine 20 MG tablet Commonly known as: BENTYL Take 1 tablet (20 mg total) by mouth 3 (three) times daily before meals for 10 days.   HYDROcodone-acetaminophen 5-325 MG tablet Commonly known as: NORCO/VICODIN Take 1 tablet by mouth every 6 (six) hours as needed.   losartan 25 MG tablet Commonly known as: COZAAR Take 1 tablet (25 mg total) by mouth daily.   omeprazole 20 MG  capsule Commonly known as: PRILOSEC TAKE 1 CAPSULE(20 MG) BY MOUTH DAILY   ondansetron 4 MG disintegrating tablet Commonly known as: ZOFRAN-ODT Take 1 tablet (4 mg total) by mouth every 8 (eight) hours as needed for nausea or vomiting.   Sutab [phone] MG Tabs Generic drug: Sodium Sulfate-Mag Sulfate-KCl TAKE 12 TABLETS BY MOUTH FOR 1 DOSE AS DIRECTED   tamsulosin 0.4 MG Caps capsule Commonly known as: FLOMAX Take 1 capsule (0.4 mg total) by mouth daily after supper for 14 days.   vancomycin 125 MG capsule Commonly known as: VANCOCIN Take 1 capsule (125 mg total) by mouth 4 (four) times daily for 14 days.        Allergies:  Allergies  Allergen Reactions   Isosorbide Nitrate Other (See Comments) and Shortness Of Breath   Metoprolol Tartrate Other (See Comments) and Shortness Of Breath   Chlorthalidone Other (See Comments)    Dizziness, palpitations    Family History: Family History  Problem Relation Age of Onset   CAD Father 36       MI   Hyperlipidemia Father    Diabetes Paternal Grandfather    Cancer Mother 76       deceased from bone cancer   Stroke Neg Hx     Social History:  reports that he has never smoked. His smokeless tobacco use includes chew. He reports current alcohol use. He reports that he does not use drugs.   Physical Exam: BP 133/85   Pulse (!) 116   Ht '5\' 8"'$  (1.727 m)   Wt 250 lb (113.4 kg)   BMI 38.01 kg/m   Constitutional:  Alert and oriented, No acute distress. HEENT: [place] AT Respiratory: Normal respiratory effort, no increased work of breathing. Psychiatric: Normal mood and affect.  Laboratory Data:  Urinalysis Pending   Pertinent Imaging: CT images were personally reviewed and interpreted  Assessment & Plan:    1.  Right distal ureteral calculus Remains symptomatic Was empirically treated for C. difficile and testing was negative We discussed various treatment options for urolithiasis including observation with or  without medical expulsive therapy, shockwave lithotripsy (SWL), ureteroscopy and laser lithotripsy with stent placement. We discussed that management is based on stone size, location, density, patient co-morbidities, and patient preference.  Stones <38m in size have a >80% spontaneous passage rate. Data surrounding the use of tamsulosin for medical expulsive therapy is controversial, but meta analyses suggests it is most efficacious for distal stones between 5-120min size. Possible side effects include dizziness/lightheadedness, and retrograde ejaculation. SWL has a lower stone free rate in a single procedure, but also a lower complication rate compared to ureteroscopy and avoids a stent and associated stent related symptoms. Possible complications include renal hematoma, steinstrasse, and need for additional treatment. Ureteroscopy with laser lithotripsy and stent placement has a higher stone free rate than SWL in a single procedure, however increased complication rate including possible infection, ureteral injury, bleeding, and stent  related morbidity. Common stent related symptoms include dysuria, urgency/frequency, and flank pain. After discussing these options he would primarily be interested in SWL however a 2 mm stone may not be visualized on abdominal imaging.  He was sent for KUB and will be notified with results If the calculus is not visualized he requested ureteroscopy ASAP    Abbie Sons, [place] [address], [place], [place] [address] [phone]

## 2022-05-01 NOTE — Addendum Note (Signed)
Addended by: Kyra Manges on: 05/01/2022 02:29 PM   Modules accepted: Orders

## 2022-05-02 ENCOUNTER — Other Ambulatory Visit: Payer: Self-pay | Admitting: Urology

## 2022-05-02 ENCOUNTER — Telehealth: Payer: Self-pay

## 2022-05-02 DIAGNOSIS — N201 Calculus of ureter: Secondary | ICD-10-CM

## 2022-05-02 NOTE — Progress Notes (Signed)
Surgical Physician Order Form [facility]   * Scheduling expectation :  05/03/2022 w/ Diamantina Providence  *Length of Case: 30 minutes  *Clearance needed: no  *Anticoagulation Instructions: N/A  *Aspirin Instructions: N/A  *Post-op visit Date/Instructions:  1 month follow up with PA  *Diagnosis: Right Ureteral Stone  *Procedure: Right Ureteroscopy w/basket stone removal (60045)   Additional orders: N/A  -Admit type: OUTpatient  -Anesthesia: Choice  -VTE Prophylaxis Standing Order SCD's       Other:   -Standing Lab Orders Per Anesthesia    Lab other: None  -Standing Test orders EKG/Chest x-ray per Anesthesia       Test other:   - Medications:  Ancef 2gm IV  -Other orders:  N/A

## 2022-05-02 NOTE — Progress Notes (Signed)
[facility] Posting Form   Surgery Date/Time: Date: 05/03/2022  Surgeon: Dr. Nickolas Madrid, MD  Surgery Location: Day Surgery  Inpt ( No  )   Outpt (Yes)   Obs ( No  )   Diagnosis: N20.1 Right Ureteral Stone  -CPT: 83419, [id number]  Surgery: Right Ureteroscopy with Basket Stone Removal and possible Right Ureteral Stent Placement, Possible laser lithotripsy   Stop Anticoagulations: N/A  Cardiac/Medical/Pulmonary Clearance needed: no  *Orders entered into EPIC  Date: 05/02/22   *Case booked in EPIC  Date: 05/02/22  *Notified pt of Surgery: Date: 05/02/22  PRE-OP UA & CX: no  *Placed into Prior Authorization Work Que Date: 05/02/22   Assistant/laser/rep:No

## 2022-05-02 NOTE — Telephone Encounter (Signed)
I spoke with Mr. Springer. We have discussed possible surgery dates and Friday September 8th, 2023 was agreed upon by all parties. Patient given information about surgery date, what to expect pre-operatively and post operatively.  We discussed that a Pre-Admission Testing office will be calling to set up the pre-op visit that will take place prior to surgery, and that these appointments are typically done over the phone with a Pre-Admissions RN.  Informed patient that our office will communicate any additional care to be provided after surgery. Patients questions or concerns were discussed during our call. Advised to call our office should there be any additional information, questions or concerns that arise. Patient verbalized understanding.

## 2022-05-03 ENCOUNTER — Other Ambulatory Visit: Payer: Self-pay

## 2022-05-03 ENCOUNTER — Encounter
Admission: RE | Disposition: A | Discharge status: Home / Self Care | Payer: Self-pay | Source: Home / Self Care | Attending: Urology

## 2022-05-03 ENCOUNTER — Ambulatory Visit: Payer: 59 | Admitting: Anesthesiology

## 2022-05-03 ENCOUNTER — Ambulatory Visit: Payer: 59

## 2022-05-03 ENCOUNTER — Ambulatory Visit
Admission: RE | Admit: 2022-05-03 | Discharge: 2022-05-03 | Disposition: A | Discharge status: Home / Self Care | Payer: 59 | Attending: Urology | Admitting: Urology

## 2022-05-03 ENCOUNTER — Encounter: Payer: Self-pay | Admitting: Urology

## 2022-05-03 DIAGNOSIS — K219 Gastro-esophageal reflux disease without esophagitis: Secondary | ICD-10-CM | POA: Diagnosis not present

## 2022-05-03 DIAGNOSIS — I2510 Atherosclerotic heart disease of native coronary artery without angina pectoris: Secondary | ICD-10-CM | POA: Insufficient documentation

## 2022-05-03 DIAGNOSIS — N201 Calculus of ureter: Secondary | ICD-10-CM

## 2022-05-03 DIAGNOSIS — M1990 Unspecified osteoarthritis, unspecified site: Secondary | ICD-10-CM | POA: Insufficient documentation

## 2022-05-03 DIAGNOSIS — Z79899 Other long term (current) drug therapy: Secondary | ICD-10-CM | POA: Insufficient documentation

## 2022-05-03 DIAGNOSIS — N132 Hydronephrosis with renal and ureteral calculous obstruction: Secondary | ICD-10-CM | POA: Diagnosis present

## 2022-05-03 DIAGNOSIS — N1330 Unspecified hydronephrosis: Secondary | ICD-10-CM

## 2022-05-03 DIAGNOSIS — Z981 Arthrodesis status: Secondary | ICD-10-CM | POA: Diagnosis not present

## 2022-05-03 DIAGNOSIS — I10 Essential (primary) hypertension: Secondary | ICD-10-CM | POA: Insufficient documentation

## 2022-05-03 HISTORY — PX: HOLMIUM LASER APPLICATION: SHX5852

## 2022-05-03 HISTORY — PX: CYSTOSCOPY WITH URETEROSCOPY, STONE BASKETRY AND STENT PLACEMENT: SHX6378

## 2022-05-03 SURGERY — CYSTOSCOPY, WITH CALCULUS MANIPULATION OR REMOVAL
Anesthesia: General | Laterality: Right

## 2022-05-03 MED ORDER — ACETAMINOPHEN 10 MG/ML IV SOLN
INTRAVENOUS | Status: DC | PRN
  Administered 2022-05-03: 1000 mg via INTRAVENOUS

## 2022-05-03 MED ORDER — OXYCODONE HCL 5 MG PO TABS
ORAL_TABLET | ORAL | Status: AC
  Administered 2022-05-03: 5 mg via ORAL
  Filled 2022-05-03: qty 1

## 2022-05-03 MED ORDER — FENTANYL CITRATE (PF) 100 MCG/2ML IJ SOLN
INTRAMUSCULAR | Status: AC
  Filled 2022-05-03: qty 2

## 2022-05-03 MED ORDER — OXYCODONE HCL 5 MG/5ML PO SOLN: 5.0000 mg | Freq: Once | ORAL | Status: AC | PRN

## 2022-05-03 MED ORDER — IOHEXOL 180 MG/ML  SOLN
INTRAMUSCULAR | Status: DC | PRN
  Administered 2022-05-03: 10 mL

## 2022-05-03 MED ORDER — ROCURONIUM BROMIDE 10 MG/ML (PF) SYRINGE
PREFILLED_SYRINGE | INTRAVENOUS | Status: AC
  Filled 2022-05-03: qty 10

## 2022-05-03 MED ORDER — HYDROCODONE-ACETAMINOPHEN 5-325 MG PO TABS: 1.0000 | ORAL_TABLET | Freq: Four times a day (QID) | ORAL | 0 refills | Status: AC | PRN

## 2022-05-03 MED ORDER — CHLORHEXIDINE GLUCONATE 0.12 % MT SOLN
OROMUCOSAL | Status: AC
  Administered 2022-05-03: 15 mL via OROMUCOSAL
  Filled 2022-05-03: qty 15

## 2022-05-03 MED ORDER — LIDOCAINE HCL (PF) 2 % IJ SOLN
INTRAMUSCULAR | Status: AC
  Filled 2022-05-03: qty 5

## 2022-05-03 MED ORDER — LACTATED RINGERS IV SOLN: INTRAVENOUS | Status: DC

## 2022-05-03 MED ORDER — ONDANSETRON HCL 4 MG/2ML IJ SOLN
INTRAMUSCULAR | Status: DC | PRN
  Administered 2022-05-03: 4 mg via INTRAVENOUS

## 2022-05-03 MED ORDER — DEXAMETHASONE SODIUM PHOSPHATE 10 MG/ML IJ SOLN
INTRAMUSCULAR | Status: AC
  Filled 2022-05-03: qty 1

## 2022-05-03 MED ORDER — PROPOFOL 10 MG/ML IV BOLUS
INTRAVENOUS | Status: AC
  Filled 2022-05-03: qty 20

## 2022-05-03 MED ORDER — PROPOFOL 10 MG/ML IV BOLUS
INTRAVENOUS | Status: DC | PRN
  Administered 2022-05-03: 130 mg via INTRAVENOUS
  Administered 2022-05-03: 50 mg via INTRAVENOUS

## 2022-05-03 MED ORDER — DROPERIDOL 2.5 MG/ML IJ SOLN: 0.6250 mg | Freq: Once | INTRAMUSCULAR | Status: DC | PRN

## 2022-05-03 MED ORDER — ACETAMINOPHEN 10 MG/ML IV SOLN
INTRAVENOUS | Status: AC
  Filled 2022-05-03: qty 100

## 2022-05-03 MED ORDER — DEXMEDETOMIDINE (PRECEDEX) IN NS 20 MCG/5ML (4 MCG/ML) IV SYRINGE
PREFILLED_SYRINGE | INTRAVENOUS | Status: DC | PRN
  Administered 2022-05-03: 4 ug via INTRAVENOUS

## 2022-05-03 MED ORDER — FENTANYL CITRATE (PF) 100 MCG/2ML IJ SOLN
INTRAMUSCULAR | Status: DC | PRN
Start: 2022-05-03 — End: 2022-05-03
  Administered 2022-05-03: 50 ug via INTRAVENOUS
  Administered 2022-05-03 (×2): 25 ug via INTRAVENOUS

## 2022-05-03 MED ORDER — OXYCODONE HCL 5 MG PO TABS: 5.0000 mg | ORAL_TABLET | Freq: Once | ORAL | Status: AC | PRN

## 2022-05-03 MED ORDER — ACETAMINOPHEN 10 MG/ML IV SOLN: 1000.0000 mg | Freq: Once | INTRAVENOUS | Status: DC | PRN

## 2022-05-03 MED ORDER — NITROFURANTOIN MACROCRYSTAL 100 MG PO CAPS: 100.0000 mg | ORAL_CAPSULE | Freq: Every day | ORAL | 0 refills | Status: AC

## 2022-05-03 MED ORDER — PHENYLEPHRINE 80 MCG/ML (10ML) SYRINGE FOR IV PUSH (FOR BLOOD PRESSURE SUPPORT)
PREFILLED_SYRINGE | INTRAVENOUS | Status: DC | PRN
  Administered 2022-05-03: 80 ug via INTRAVENOUS

## 2022-05-03 MED ORDER — CEFAZOLIN SODIUM-DEXTROSE 2-4 GM/100ML-% IV SOLN
2.0000 g | INTRAVENOUS | Status: AC
  Administered 2022-05-03: 2 g via INTRAVENOUS

## 2022-05-03 MED ORDER — OXYBUTYNIN CHLORIDE ER 10 MG PO TB24: 10.0000 mg | ORAL_TABLET | Freq: Every day | ORAL | 0 refills | Status: AC | PRN

## 2022-05-03 MED ORDER — ONDANSETRON HCL 4 MG/2ML IJ SOLN
INTRAMUSCULAR | Status: AC
  Filled 2022-05-03: qty 2

## 2022-05-03 MED ORDER — CEFAZOLIN SODIUM-DEXTROSE 2-4 GM/100ML-% IV SOLN
INTRAVENOUS | Status: AC
  Filled 2022-05-03: qty 100

## 2022-05-03 MED ORDER — KETOROLAC TROMETHAMINE 30 MG/ML IJ SOLN
INTRAMUSCULAR | Status: DC | PRN
  Administered 2022-05-03: 30 mg via INTRAVENOUS

## 2022-05-03 MED ORDER — PROMETHAZINE HCL 25 MG/ML IJ SOLN: 6.2500 mg | INTRAMUSCULAR | Status: DC | PRN

## 2022-05-03 MED ORDER — ORAL CARE MOUTH RINSE: 15.0000 mL | Freq: Once | OROMUCOSAL | Status: AC

## 2022-05-03 MED ORDER — LIDOCAINE HCL (CARDIAC) PF 100 MG/5ML IV SOSY
PREFILLED_SYRINGE | INTRAVENOUS | Status: DC | PRN
  Administered 2022-05-03: 100 mg via INTRAVENOUS

## 2022-05-03 MED ORDER — FENTANYL CITRATE (PF) 100 MCG/2ML IJ SOLN: 25.0000 ug | INTRAMUSCULAR | Status: DC | PRN

## 2022-05-03 MED ORDER — MIDAZOLAM HCL 2 MG/2ML IJ SOLN
INTRAMUSCULAR | Status: DC | PRN
  Administered 2022-05-03: 2 mg via INTRAVENOUS

## 2022-05-03 MED ORDER — MIDAZOLAM HCL 2 MG/2ML IJ SOLN
INTRAMUSCULAR | Status: AC
  Filled 2022-05-03: qty 2

## 2022-05-03 MED ORDER — SODIUM CHLORIDE 0.9 % IR SOLN
Status: DC | PRN
  Administered 2022-05-03: 3000 mL

## 2022-05-03 MED ORDER — DEXAMETHASONE SODIUM PHOSPHATE 10 MG/ML IJ SOLN
INTRAMUSCULAR | Status: DC | PRN
  Administered 2022-05-03: 5 mg via INTRAVENOUS

## 2022-05-03 MED ORDER — CHLORHEXIDINE GLUCONATE 0.12 % MT SOLN: 15.0000 mL | Freq: Once | OROMUCOSAL | Status: AC

## 2022-05-03 SURGICAL SUPPLY — 24 items
ADH LQ OCL WTPRF AMP STRL LF (MISCELLANEOUS) ×1
ADHESIVE MASTISOL STRL (MISCELLANEOUS) IMPLANT
BAG DRAIN SIEMENS DORNER NS (MISCELLANEOUS) ×1 IMPLANT
BAG DRN NS LF (MISCELLANEOUS) ×1
BAG PRESSURE INF REUSE 3000 (BAG) ×1 IMPLANT
BRUSH SCRUB EZ 1% IODOPHOR (MISCELLANEOUS) ×1 IMPLANT
DRAPE UTILITY 15X26 TOWEL STRL (DRAPES) ×1 IMPLANT
DRSG TEGADERM 2-3/8X2-3/4 SM (GAUZE/BANDAGES/DRESSINGS) IMPLANT
FIBER LASER MOSES 200 DFL (Laser) IMPLANT
GLOVE SURG UNDER POLY LF SZ7.5 (GLOVE) ×1 IMPLANT
GOWN STRL REUS W/ TWL LRG LVL3 (GOWN DISPOSABLE) ×1 IMPLANT
GOWN STRL REUS W/ TWL XL LVL3 (GOWN DISPOSABLE) ×1 IMPLANT
GOWN STRL REUS W/TWL LRG LVL3 (GOWN DISPOSABLE) ×1
GOWN STRL REUS W/TWL XL LVL3 (GOWN DISPOSABLE) ×1
GUIDEWIRE STR DUAL SENSOR (WIRE) ×1 IMPLANT
IV NS IRRIG 3000ML ARTHROMATIC (IV SOLUTION) ×1 IMPLANT
KIT TURNOVER CYSTO (KITS) ×1 IMPLANT
PACK CYSTO AR (MISCELLANEOUS) ×1 IMPLANT
SET CYSTO W/LG BORE CLAMP LF (SET/KITS/TRAYS/PACK) ×1 IMPLANT
STENT URET 6FRX24 CONTOUR (STENTS) IMPLANT
STENT URET 6FRX26 CONTOUR (STENTS) IMPLANT
SURGILUBE 2OZ TUBE FLIPTOP (MISCELLANEOUS) ×1 IMPLANT
SYR 10ML LL (SYRINGE) ×1 IMPLANT
WATER STERILE IRR 500ML POUR (IV SOLUTION) ×1 IMPLANT

## 2022-05-03 NOTE — Anesthesia Preprocedure Evaluation (Addendum)
Anesthesia Evaluation  Patient identified by MRN, date of birth, ID band Patient awake    Reviewed: Allergy & Precautions, NPO status , [name] Chart, lab work & pertinent test results  History of Anesthesia Complications Negative for: history of anesthetic complications  Airway Mallampati: III  TM Distance: <3 FB Neck ROM: full    Dental no notable dental hx. (+) Dental Advidsory Given   Pulmonary neg shortness of breath, Patient abstained from smoking.,    Pulmonary exam normal        Cardiovascular Exercise Tolerance: Good hypertension, Pt. on medications (-) angina+ CAD  Normal cardiovascular exam+ dysrhythmias   MPS 2022: LVEF= 46%   FINDINGS:  Regional wall motion: reveals normal myocardial thickening and wall  motion.  The overall quality of the study is good.   Artifacts noted: no  Left ventricular cavity: normal.     Neuro/Psych S/P cervical spinal fusion negative psych ROS   GI/Hepatic Neg liver ROS, GERD  Controlled,  Endo/Other  negative endocrine ROS  Renal/GU negative Renal ROS  negative genitourinary   Musculoskeletal  (+) Arthritis ,   Abdominal (+) + obese,   Peds  Hematology negative hematology ROS (+)   Anesthesia Other Findings Past Medical History: No date: Alcohol use     Comment:  regularly 3 beers/day No date: CAD (coronary artery disease)     Comment:  mild No date: Dyslipidemia No date: GERD (gastroesophageal reflux disease)     Comment:  with esophagitis No date: History of Helicobacter pylori infection No date: Hypertension, essential 01/19/2016: Obesity, Class I, BMI 30-34.9  Past Surgical History: 2012: ANTERIOR CERVICAL DECOMP/DISCECTOMY FUSION     Comment:  C4-5 [facility]) 03/12/2018: ANTERIOR CERVICAL DECOMP/DISCECTOMY FUSION; N/A     Comment:  ACDF C4-6, EXPLORATION OF FUSION C6-7, REMOVAL OF               HARDWARE;  [name], Duane Lope, MD) 10/20/2019: ARTERY BIOPSY;  Left     Comment:  TEMPORAL ARTERY BIOPSY - Schnier, Dolores Lory, MD 2013: CARDIAC CATHETERIZATION     Comment:  no significant CAD, nl LVEF 60% [name]) 2012: COLONOSCOPY     Comment:  WNL per PCP report 2015: CT CTA CORONARY W/CA SCORE W/CM &/OR WO/CM     Comment:  minor luminal irregularities, Ca score = 90 [name]) No date: KNEE ARTHROSCOPY; Bilateral 07/31/2021: LEFT HEART CATH AND CORONARY ANGIOGRAPHY; N/A     Comment:  Procedure: LEFT HEART CATH AND CORONARY ANGIOGRAPHY;                Surgeon: [name], MD;  Location: [place]               CV LAB;  Service: Cardiovascular;  Laterality: N/A; 2019: ROTATOR CUFF REPAIR; Right No date: SCLEROTHERAPY     Comment:  legs  BMI    Body Mass Index: 38.01 kg/m      Reproductive/Obstetrics negative OB ROS                           Anesthesia Physical  Anesthesia Plan  ASA: 2  Anesthesia Plan: General   Post-op Pain Management: Toradol IV (intra-op)* and Ofirmev IV (intra-op)*   Induction: Intravenous  PONV Risk Score and Plan: 2 and Midazolam, Dexamethasone and Ondansetron  Airway Management Planned: Oral ETT  Additional Equipment:   Intra-op Plan:   Post-operative Plan:   Informed Consent: I have reviewed the patients History and Physical, chart,  labs and discussed the procedure including the risks, benefits and alternatives for the proposed anesthesia with the patient or authorized representative who has indicated his/her understanding and acceptance.     Dental Advisory Given  Plan Discussed with: Anesthesiologist, CRNA and Surgeon  Anesthesia Plan Comments: (Patient consented for risks of anesthesia including but not limited to:  - adverse reactions to medications - risk of airway placement if required - damage to eyes, teeth, lips or other oral mucosa - nerve damage due to positioning  - sore throat or hoarseness - Damage to heart, brain, nerves, lungs, other parts of body or  loss of life  Patient voiced understanding.)       Anesthesia Quick Evaluation

## 2022-05-03 NOTE — Interval H&P Note (Signed)
UROLOGY H&P UPDATE  Agree with prior H&P dated 05/01/2022 by Dr. Bernardo Heater.  61 year old male with 2 mm right distal ureteral stone, multiple ER visits, and ongoing renal colic who opted for intervention with ureteroscopy.  Continues to report flank pain and preop.  Cardiac: RRR Lungs: CTA bilaterally  Laterality: Right Procedure: Right ureteroscopy, laser lithotripsy, stent placement  Urine: Urinalysis 9/6 benign  We specifically discussed the risks ureteroscopy including bleeding, infection/sepsis, stent related symptoms including flank pain/urgency/frequency/incontinence/dysuria, ureteral injury, inability to access stone, or need for staged or additional procedures.   [name], MD 05/03/2022

## 2022-05-03 NOTE — Discharge Instructions (Signed)

## 2022-05-03 NOTE — Anesthesia Procedure Notes (Signed)
Procedure Name: LMA Insertion Date/Time: 05/03/2022 10:50 AM  Performed by: Loletha Grayer, CRNAPre-anesthesia Checklist: Patient identified, Patient being monitored, Timeout performed, Emergency Drugs available and Suction available Patient Re-evaluated:Patient Re-evaluated prior to induction Oxygen Delivery Method: Circle system utilized Preoxygenation: Pre-oxygenation with 100% oxygen Induction Type: IV induction Ventilation: Mask ventilation without difficulty LMA: LMA inserted LMA Size: 4.0 Number of attempts: 1 Placement Confirmation: positive ETCO2 Tube secured with: Tape Dental Injury: Teeth and Oropharynx as per pre-operative assessment

## 2022-05-03 NOTE — Op Note (Signed)
Date of procedure: 05/03/22  Preoperative diagnosis:  Right ureteral stone  Postoperative diagnosis:  Same  Procedure: Cystoscopy, right ureteroscopy, laser lithotripsy, right retrograde pyelogram with intraoperative interpretation, right ureteral stent placement  Surgeon: Nickolas Madrid, MD  Anesthesia: General  Complications: None  Intraoperative findings:  Small prostate, normal cystoscopy, 3 mm right distal ureteral stone fragmented and irrigated free from ureter, uncomplicated stent placement  EBL: Minimal  Specimens: None  Drains: Right 6 French by 26 cm ureteral stent  Indication: [name] [name] is a 61 y.o. patient with 3 mm right distal ureteral stone with hydronephrosis, multiple ER visits who opted for ureteroscopy.  After reviewing the management options for treatment, they elected to proceed with the above surgical procedure(s). We have discussed the potential benefits and risks of the procedure, side effects of the proposed treatment, the likelihood of the patient achieving the goals of the procedure, and any potential problems that might occur during the procedure or recuperation. Informed consent has been obtained.  Description of procedure:  The patient was taken to the operating room and general anesthesia was induced. SCDs were placed for DVT prophylaxis. The patient was placed in the dorsal lithotomy position, prepped and draped in the usual sterile fashion, and preoperative antibiotics(Ancef) were administered. A preoperative time-out was performed.   A 21 French rigid cystoscope was used to intubate the urethra and a normal-appearing urethra was followed proximally into the bladder.  The prostate was small with a very small median lobe, and thorough cystoscopy showed no suspicious lesions and the ureteral orifices were orthotopic bilaterally.  A sensor wire was advanced into the right ureteral orifice and passed easily up to the kidney under fluoroscopic vision.   A semirigid short ureteroscope advanced easily alongside the wire and there was a irregularly shaped yellow 3 mm calculus lodged in the distal ureter.  A 200 m laser fiber on settings of 1.0 J and 10 Hz was used to methodically fragment the stone to dust.  The fragments were irrigated free from the ureter, no other stones remained.  There was moderate edema at the distal ureter and I opted to place a stent.  A retrograde pyelogram was performed from the mid ureter and showed no extravasation or filling defects.  The rigid cystoscope was backloaded over the wire and a 6 [place] by 26 cm ureteral stent was uneventfully placed with an excellent curl in the renal pelvis, as well as under direct vision in the bladder.  The bladder was drained and the Dangler was secured to the penis using Mastisol and Tegaderm.  Disposition: Stable to PACU  Plan: Remove stent at home on Tuesday morning Follow-up in 6 months with KUB prior, discuss stone prevention  Nickolas Madrid, MD

## 2022-05-03 NOTE — Anesthesia Postprocedure Evaluation (Signed)
Anesthesia Post Note  Patient: [name] [name]  Procedure(s) Performed: CYSTOSCOPY WITH URETEROSCOPY, STONE BASKETRY AND STENT PLACEMENT (Right) HOLMIUM LASER APPLICATION (Right)  Patient location during evaluation: PACU Anesthesia Type: General Level of consciousness: awake and alert Pain management: pain level controlled Vital Signs Assessment: post-procedure vital signs reviewed and stable Respiratory status: spontaneous breathing, nonlabored ventilation, respiratory function stable and patient connected to nasal cannula oxygen Cardiovascular status: blood pressure returned to baseline and stable Postop Assessment: no apparent nausea or vomiting Anesthetic complications: no   No notable events documented.   Last Vitals:  Vitals:   05/03/22 1148 05/03/22 1203  BP: (!) 158/91 (!) 163/95  Pulse: 71 82  Resp: 14 14  Temp:  (!) 36.2 C  SpO2: 96% 95%    Last Pain:  Vitals:   05/03/22 1203  TempSrc:   PainSc: [place]

## 2022-05-03 NOTE — Transfer of Care (Signed)
Immediate Anesthesia Transfer of Care Note  Patient: [name] [name]  Procedure(s) Performed: CYSTOSCOPY WITH URETEROSCOPY, STONE BASKETRY AND STENT PLACEMENT (Right) HOLMIUM LASER APPLICATION (Right)  Patient Location: PACU  Anesthesia Type:General  Level of Consciousness: drowsy  Airway & Oxygen Therapy: Patient Spontanous Breathing and Patient connected to face mask oxygen  Post-op Assessment: Report given to RN and Post -op Vital signs reviewed and stable  Post vital signs: Reviewed and stable  Last Vitals:  Vitals Value Taken Time  BP 115/72 05/03/22 1121  Temp    Pulse 70 05/03/22 1121  Resp 17 05/03/22 1121  SpO2 97 % 05/03/22 1121    Last Pain:  Vitals:   05/03/22 0906  TempSrc: Temporal  PainSc: 2          Complications: No notable events documented.

## 2022-05-06 ENCOUNTER — Encounter: Payer: Self-pay | Admitting: Urology

## 2022-05-07 ENCOUNTER — Encounter: Payer: Self-pay | Admitting: Physician Assistant

## 2022-05-07 ENCOUNTER — Ambulatory Visit (INDEPENDENT_AMBULATORY_CARE_PROVIDER_SITE_OTHER): Payer: 59 | Admitting: Physician Assistant

## 2022-05-07 VITALS — BP 119/81 | HR 123 | Ht 68.0 in | Wt 250.0 lb

## 2022-05-07 DIAGNOSIS — N23 Unspecified renal colic: Secondary | ICD-10-CM | POA: Diagnosis not present

## 2022-05-07 DIAGNOSIS — N201 Calculus of ureter: Secondary | ICD-10-CM | POA: Diagnosis not present

## 2022-05-07 MED ORDER — KETOROLAC TROMETHAMINE 10 MG PO TABS: 10.0000 mg | ORAL_TABLET | Freq: Four times a day (QID) | ORAL | 0 refills | Status: DC | PRN

## 2022-05-07 MED ORDER — KETOROLAC TROMETHAMINE 60 MG/2ML IM SOLN
60.0000 mg | Freq: Once | INTRAMUSCULAR | Status: AC
  Administered 2022-05-07: 30 mg via INTRAMUSCULAR

## 2022-05-07 NOTE — Progress Notes (Signed)
05/07/2022 2:52 PM   [name] [name] [birth year] [id number]  CC: Chief Complaint  Patient presents with   Abdominal Pain   HPI: [name] [name] is a 61 y.o. male who underwent right ureteroscopy with laser lithotripsy and stent placement with Dr. Diamantina Providence 4 days ago for management of a 3 mm distal right ureteral stone who presents today for evaluation of RLQ pain after removing his stent at home yesterday.  Today he reports he removed his stent without difficulty yesterday.  He subsequently developed RLQ pain, nausea, diarrhea, and urinary frequency similar to his prior stone pain.  The pain was >10/10 in intensity this morning but he took a hydrocodone for it.  Is now 8/10 in intensity.  He denies fevers or chills.  He denies a history of GI bleeds, GI surgery, and states he has never been told to avoid NSAIDs.  He is unable to provide a urine sample today for urinalysis.  I offered to catheterize him and he declined.  I offered him water and he also declined.  PMH: Past Medical History:  Diagnosis Date   Alcohol use    regularly 3 beers/day   CAD (coronary artery disease)    mild   Dyslipidemia    GERD (gastroesophageal reflux disease)    with esophagitis   History of Helicobacter pylori infection    Hypertension, essential    Obesity, Class I, BMI 30-34.9 01/19/2016    Surgical History: Past Surgical History:  Procedure Laterality Date   ANTERIOR CERVICAL DECOMP/DISCECTOMY FUSION  2012   C4-5 ([place])   ANTERIOR CERVICAL DECOMP/DISCECTOMY FUSION N/A 03/12/2018   ACDF C4-6, EXPLORATION OF FUSION C6-7, REMOVAL OF HARDWARE;  [name] [name], [name], MD)   ARTERY BIOPSY Left 10/20/2019   TEMPORAL ARTERY BIOPSY - [name] [name], [name] Lory, MD   CARDIAC CATHETERIZATION  2013   no significant CAD, nl LVEF 60% [name])   COLONOSCOPY  2012   WNL per PCP report   COLONOSCOPY  10/06/2021   done for rectal bleed after polypectomy s/p clip, rec rpt colonoscopy 6 months to remove polyp seen ascending  colon ([name] [name])   COLONOSCOPY WITH PROPOFOL N/A 10/04/2021   TAx2, low grade dysplasia present, hemorrhoids, diverticulosis, rpt 3 yrs [name] [name], [name] Due, MD)   COLONOSCOPY WITH PROPOFOL N/A 10/06/2021   Procedure: COLONOSCOPY WITH PROPOFOL;  Surgeon: [name], MD;  Location: [facility] ENDOS[facility];  Service: Endoscopy;  Laterality: N/A;   CT CTA CORONARY W/CA SCORE W/CM &/OR WO/CM  2015   minor luminal irregularities, Ca score = 90 [name])   CYSTOSCOPY WITH URETEROSCOPY, STONE BASKETRY AND STENT PLACEMENT Right 05/03/2022   Procedure: CYSTOSCOPY WITH URETEROSCOPY, STONE BASKETRY AND STENT PLACEMENT;  Surgeon: [name], MD;  Location: [facility] OR[facility];  Service: Urology;  Laterality: Right;   FLEXIBLE SIGMOIDOSCOPY N/A 10/05/2021   Procedure: FLEXIBLE SIGMOIDOSCOPY;  Surgeon: [name], MD;  Location: [facility] ENDOSCOPY;  Service: Gastroenterology;  Laterality: N/A;   HOLMIUM LASER APPLICATION Right 12/24/256   Procedure: HOLMIUM LASER APPLICATION;  Surgeon: [name], MD;  Location: [facility] ORS;  Service: Urology;  Laterality: Right;   KNEE ARTHROSCOPY Bilateral    LEFT HEART CATH AND CORONARY ANGIOGRAPHY N/A 07/31/2021   Procedure: LEFT HEART CATH AND CORONARY ANGIOGRAPHY;  Surgeon: [name], MD;  Location: [place] CV [facility];  Service: Cardiovascular;  Laterality: N/A;   ROTATOR CUFF REPAIR Right 2019   SCLEROTHERAPY     legs    Home Medications:  Allergies as of 05/07/2022  Reactions   Isosorbide Nitrate Other (See Comments), Shortness Of Breath   Metoprolol Tartrate Other (See Comments), Shortness Of Breath   Chlorthalidone Other (See Comments)   Dizziness, palpitations        Medication List        Accurate as of May 07, 2022  2:52 PM. If you have any questions, ask your nurse or doctor.          atorvastatin 80 MG tablet Commonly known as: LIPITOR Take 1 tablet (80 mg total) by mouth daily.   dicyclomine 20 MG tablet Commonly known  as: BENTYL Take 1 tablet (20 mg total) by mouth 3 (three) times daily before meals for 10 days.   HYDROcodone-acetaminophen 5-325 MG tablet Commonly known as: NORCO/VICODIN Take 1 tablet by mouth every 6 (six) hours as needed.   ketorolac 10 MG tablet Commonly known as: TORADOL Take 1 tablet (10 mg total) by mouth every 6 (six) hours as needed. Started by: Debroah Loop, PA-C   losartan 25 MG tablet Commonly known as: COZAAR Take 1 tablet (25 mg total) by mouth daily.   nitrofurantoin 100 MG capsule Commonly known as: MACRODANTIN Take 1 capsule (100 mg total) by mouth daily for 5 days.   omeprazole 20 MG capsule Commonly known as: PRILOSEC TAKE 1 CAPSULE(20 MG) BY MOUTH DAILY   ondansetron 4 MG disintegrating tablet Commonly known as: ZOFRAN-ODT Take 1 tablet (4 mg total) by mouth every 8 (eight) hours as needed for nausea or vomiting.   oxybutynin 10 MG 24 hr tablet Commonly known as: Ditropan XL Take 1 tablet (10 mg total) by mouth daily as needed for up to 5 days (for bladder spasm/urgency/frequency).   Sutab [phone] MG Tabs Generic drug: Sodium Sulfate-Mag Sulfate-KCl   tamsulosin 0.4 MG Caps capsule Commonly known as: FLOMAX Take 1 capsule (0.4 mg total) by mouth daily after supper for 14 days.   vancomycin 125 MG capsule Commonly known as: VANCOCIN Take 1 capsule (125 mg total) by mouth 4 (four) times daily for 14 days.        Allergies:  Allergies  Allergen Reactions   Isosorbide Nitrate Other (See Comments) and Shortness Of Breath   Metoprolol Tartrate Other (See Comments) and Shortness Of Breath   Chlorthalidone Other (See Comments)    Dizziness, palpitations    Family History: Family History  Problem Relation Age of Onset   CAD Father 65       MI   Hyperlipidemia Father    Diabetes Paternal Grandfather    Cancer Mother 38       deceased from bone cancer   Stroke Neg Hx     Social History:   reports that he has never smoked.  His smokeless tobacco use includes chew. He reports current alcohol use. He reports that he does not use drugs.  Physical Exam: BP 119/81   Pulse (!) 123   Ht '5\' 8"'$  (1.727 m)   Wt 250 lb (113.4 kg)   BMI 38.01 kg/m   Constitutional:  Alert and oriented, no acute distress, nontoxic appearing HEENT: [place], AT Cardiovascular: No clubbing, cyanosis, or edema Respiratory: Normal respiratory effort, no increased work of breathing Skin: No rashes, bruises or suspicious lesions Neurologic: Grossly intact, no focal deficits, moving all 4 extremities Psychiatric: Normal mood and affect  Assessment & Plan:   1. Right ureteral stone New right renal colic after removing his stent at home yesterday.  He is unable to provide a urine sample and is declining  catheterization or additional time to provide 1.  I offered him Toradol today [name] to likely residual ureteral edema causing his symptoms.  I explained that I am unable to rule out urinary infection as the source of his symptoms without a urinalysis and he expressed understanding.  He understands that the risk of not providing a urinalysis for assessment today includes worsening urinary infection.  I encouraged him to go to the emergency department if he spikes a fever.  He expressed understanding.  IM Toradol x30 mg administered in clinic and I am prescribing p.o. Toradol x4.5 days. - ketorolac (TORADOL) injection 60 mg - ketorolac (TORADOL) 10 MG tablet; Take 1 tablet (10 mg total) by mouth every 6 (six) hours as needed.  Dispense: 18 tablet; Refill: 0  Return if symptoms worsen or fail to improve.  Debroah Loop, PA-C  [facility] [address], [place], [place] [address] [phone]

## 2022-05-22 ENCOUNTER — Telehealth: Payer: Self-pay | Admitting: Family Medicine

## 2022-05-22 NOTE — Telephone Encounter (Signed)
Lvm asking pt/pt's wife, [name] [name] (on dpr), to call back.  Pt will need to schedule OV for sleep issues.

## 2022-05-22 NOTE — Telephone Encounter (Signed)
Patient wife called and wanted patient to get something for sleep, not including acetaminophene. Call back number for the wife(Lisa) [phone].

## 2022-05-23 NOTE — Telephone Encounter (Signed)
Lvm asking pt/pt's wife, [name] (on dpr), to call back.  Pt will need to schedule OV for sleep issues.

## 2022-05-24 NOTE — Telephone Encounter (Signed)
Lvm asking pt/pt's wife, [name] (on dpr), to call back.  Pt will need to schedule OV for sleep issues.  Mailing a letter due to no response after several calls.

## 2022-06-07 ENCOUNTER — Ambulatory Visit: Payer: 59 | Admitting: Urology

## 2022-06-07 ENCOUNTER — Encounter: Payer: Self-pay | Admitting: Urology

## 2022-06-14 ENCOUNTER — Ambulatory Visit (INDEPENDENT_AMBULATORY_CARE_PROVIDER_SITE_OTHER): Payer: 59 | Admitting: Family Medicine

## 2022-06-14 ENCOUNTER — Encounter: Payer: Self-pay | Admitting: Family Medicine

## 2022-06-14 VITALS — BP 140/80 | HR 86 | Temp 97.7°F | Ht 68.0 in | Wt 231.1 lb

## 2022-06-14 DIAGNOSIS — Z789 Other specified health status: Secondary | ICD-10-CM | POA: Diagnosis not present

## 2022-06-14 DIAGNOSIS — F5104 Psychophysiologic insomnia: Secondary | ICD-10-CM | POA: Diagnosis not present

## 2022-06-14 DIAGNOSIS — I10 Essential (primary) hypertension: Secondary | ICD-10-CM

## 2022-06-14 DIAGNOSIS — F1010 Alcohol abuse, uncomplicated: Secondary | ICD-10-CM

## 2022-06-14 MED ORDER — TRAZODONE HCL 50 MG PO TABS: 25.0000 mg | ORAL_TABLET | Freq: Every evening | ORAL | 3 refills | Status: DC | PRN

## 2022-06-14 NOTE — Assessment & Plan Note (Signed)
Chronic, stable on low dose losartan - continue.

## 2022-06-14 NOTE — Assessment & Plan Note (Addendum)
Present over the past year, predominantly sleep initiation insomnia. Reviewed sleep hygiene measures and importance of nightly bedtime routine, handout provided.  Discussed medication treatment - PRN vs nightly, controlled non-benzo hypnotics vs antidepressant option. Will trial trazodone 25-'50mg'$  nightly. Update with effect.  Insomnia handout provided. He may look into Calm-type app.

## 2022-06-14 NOTE — Assessment & Plan Note (Signed)
Discussed relation of alcohol use and sleep-maintenance insomnia.

## 2022-06-14 NOTE — Patient Instructions (Addendum)
Try trazodone '25mg'$  at night time as needed for sleep, with option to increase to full tablet '50mg'$  at night.  Let us know how you do with this medicine.   Bedtime routine checklist: 1. Avoid naps during the day 2. Avoid stimulants such as caffeine and nicotine. Avoid bedtime alcohol (it can speed onset of sleep but the body's metabolism can cause awakenings). 3. All forms of exercise help ensure sound sleep - limit vigorous exercise to morning or late afternoon 4. Avoid food too close to bedtime including chocolate (which contains caffeine) 5. Soak up natural light 6. Establish regular bedtime routine. 7. Associate bed with sleep - avoid TV, computer or phone, reading while in bed. 8. Ensure pleasant, relaxing sleep environment - quiet, dark, cool room.   Insomnia Insomnia is a sleep disorder that makes it difficult to fall asleep or stay asleep. Insomnia can cause fatigue, low energy, difficulty concentrating, mood swings, and poor performance at work or school. There are three different ways to classify insomnia: Difficulty falling asleep. Difficulty staying asleep. Waking up too early in the morning. Any type of insomnia can be long-term (chronic) or short-term (acute). Both are common. Short-term insomnia usually lasts for 3 months or less. Chronic insomnia occurs at least three times a week for longer than 3 months. What are the causes? Insomnia may be caused by another condition, situation, or substance, such as: Having certain mental health conditions, such as anxiety and depression. Using caffeine, alcohol, tobacco, or drugs. Having gastrointestinal conditions, such as gastroesophageal reflux disease (GERD). Having certain medical conditions. These include: Asthma. Alzheimer's disease. Stroke. Chronic pain. An overactive thyroid gland (hyperthyroidism). Other sleep disorders, such as restless legs syndrome and sleep apnea. Menopause. Sometimes, the cause of insomnia may not be  known. What increases the risk? Risk factors for insomnia include: Gender. Females are affected more often than males. Age. Insomnia is more common as people get older. Stress and certain medical and mental health conditions. Lack of exercise. Having an irregular work schedule. This may include working night shifts and traveling between different time zones. What are the signs or symptoms? If you have insomnia, the main symptom is having trouble falling asleep or having trouble staying asleep. This may lead to other symptoms, such as: Feeling tired or having low energy. Feeling nervous about going to sleep. Not feeling rested in the morning. Having trouble concentrating. Feeling irritable, anxious, or depressed. How is this diagnosed? This condition may be diagnosed based on: Your symptoms and medical history. Your health care provider may ask about: Your sleep habits. Any medical conditions you have. Your mental health. A physical exam. How is this treated? Treatment for insomnia depends on the cause. Treatment may focus on treating an underlying condition that is causing the insomnia. Treatment may also include: Medicines to help you sleep. Counseling or therapy. Lifestyle adjustments to help you sleep better. Follow these instructions at home: Eating and drinking  Limit or avoid alcohol, caffeinated beverages, and products that contain nicotine and tobacco, especially close to bedtime. These can disrupt your sleep. Do not eat a large meal or eat spicy foods right before bedtime. This can lead to digestive discomfort that can make it hard for you to sleep. Sleep habits  Keep a sleep diary to help you and your health care provider figure out what could be causing your insomnia. Write down: When you sleep. When you wake up during the night. How well you sleep and how rested you feel the next  day. Any side effects of medicines you are taking. What you eat and drink. Make your  bedroom a dark, comfortable place where it is easy to fall asleep. Put up shades or blackout curtains to block light from outside. Use a white noise machine to block noise. Keep the temperature cool. Limit screen use before bedtime. This includes: Not watching TV. Not using your smartphone, tablet, or computer. Stick to a routine that includes going to bed and waking up at the same times every day and night. This can help you fall asleep faster. Consider making a quiet activity, such as reading, part of your nighttime routine. Try to avoid taking naps during the day so that you sleep better at night. Get out of bed if you are still awake after 15 minutes of trying to sleep. Keep the lights down, but try reading or doing a quiet activity. When you feel sleepy, go back to bed. General instructions Take over-the-counter and prescription medicines only as told by your health care provider. Exercise regularly as told by your health care provider. However, avoid exercising in the hours right before bedtime. Use relaxation techniques to manage stress. Ask your health care provider to suggest some techniques that may work well for you. These may include: Breathing exercises. Routines to release muscle tension. Visualizing peaceful scenes. Make sure that you drive carefully. Do not drive if you feel very sleepy. Keep all follow-up visits. This is important. Contact a health care provider if: You are tired throughout the day. You have trouble in your daily routine due to sleepiness. You continue to have sleep problems, or your sleep problems get worse. Get help right away if: You have thoughts about hurting yourself or someone else. Get help right away if you feel like you may hurt yourself or others, or have thoughts about taking your own life. Go to your nearest emergency room or: Call 911. Call the [facility] at [phone] or 988. This is open 24 hours a day. Text  the Crisis Text Line at (801)637-6075. Summary Insomnia is a sleep disorder that makes it difficult to fall asleep or stay asleep. Insomnia can be long-term (chronic) or short-term (acute). Treatment for insomnia depends on the cause. Treatment may focus on treating an underlying condition that is causing the insomnia. Keep a sleep diary to help you and your health care provider figure out what could be causing your insomnia. This information is not intended to replace advice given to you by your health care provider. Make sure you discuss any questions you have with your health care provider. Document Revised: 07/23/2021 Document Reviewed: 07/23/2021 Elsevier Patient Education  [place].

## 2022-06-14 NOTE — Progress Notes (Signed)
Patient ID: [name] [name], male    DOB: 28-Jun-1961, 61 y.o.   MRN: [id number]  This visit was conducted in person.  BP (!) 140/80 (BP Location: Right Arm, Cuff Size: Large)   Pulse 86   Temp 97.7 F (36.5 C) (Temporal)   Ht '5\' 8"'$  (1.727 m)   Wt 231 lb 2 oz (104.8 kg)   SpO2 97%   BMI 35.14 kg/m   BP Readings from Last 3 Encounters:  06/14/22 (!) 140/80  05/07/22 119/81  05/03/22 (!) 163/95   CC: discuss insomnia.  Subjective:   HPI: [name] [name] is a 61 y.o. male presenting on 06/14/2022 for Insomnia (C/o trouble falling asleep.  Started about 1 yr ago, worsening.  Tried OTC melatonin- up to 20 mg, no longer helping. )   Sleep initiation insomnia present for the past year. This is happening every night.   Bedtime 9pm, but sometimes doesn't fall asleep until 4pm.  Prior to bedtime sits on couch watching TV.  No daytime naps.  This is affecting daytime energy levels and daytime fatigue. Non restorative sleep. A lot of tossing and turning in bed. He does snore loudly. No PNdyspnea, witnessed apnea. Remote evaluation for sleep apnea negative.  Primarily prone sleeper.  He finds mixed cocktail (5-6 oz) at night help him sleep (2-3 times a week).   He's tried melatonin 5-'20mg'$  at night, initially effective but losing effect.  Acetaminophen increases energy levels - up all night.   3 mo s/p R TKR Alvan Dame). Seen today by ortho, discussing R hip replacement.      Relevant past medical, surgical, family and social history reviewed and updated as indicated. Interim medical history since our last visit reviewed. Allergies and medications reviewed and updated. Outpatient Medications Prior to Visit  Medication Sig Dispense Refill   atorvastatin (LIPITOR) 80 MG tablet Take 1 tablet (80 mg total) by mouth daily.     losartan (COZAAR) 25 MG tablet Take 1 tablet (25 mg total) by mouth daily. 90 tablet 1   omeprazole (PRILOSEC) 20 MG capsule TAKE 1 CAPSULE(20 MG) BY MOUTH DAILY 90 capsule  3   dicyclomine (BENTYL) 20 MG tablet Take 1 tablet (20 mg total) by mouth 3 (three) times daily before meals for 10 days. 30 tablet 0   HYDROcodone-acetaminophen (NORCO/VICODIN) 5-325 MG tablet Take 1 tablet by mouth every 6 (six) hours as needed.     ketorolac (TORADOL) 10 MG tablet Take 1 tablet (10 mg total) by mouth every 6 (six) hours as needed. 18 tablet 0   ondansetron (ZOFRAN-ODT) 4 MG disintegrating tablet Take 1 tablet (4 mg total) by mouth every 8 (eight) hours as needed for nausea or vomiting. 15 tablet 0   Sodium Sulfate-Mag Sulfate-KCl (SUTAB) [phone] MG TABS      No facility-administered medications prior to visit.     Per HPI unless specifically indicated in ROS section below Review of Systems  Objective:  BP (!) 140/80 (BP Location: Right Arm, Cuff Size: Large)   Pulse 86   Temp 97.7 F (36.5 C) (Temporal)   Ht '5\' 8"'$  (1.727 m)   Wt 231 lb 2 oz (104.8 kg)   SpO2 97%   BMI 35.14 kg/m   Wt Readings from Last 3 Encounters:  06/14/22 231 lb 2 oz (104.8 kg)  05/07/22 250 lb (113.4 kg)  05/01/22 250 lb (113.4 kg)      Physical Exam Vitals and nursing note reviewed.  Constitutional:  Appearance: Normal appearance. He is not ill-appearing.  HENT:     Mouth/Throat:     Mouth: Mucous membranes are moist.     Pharynx: Oropharynx is clear. No oropharyngeal exudate or posterior oropharyngeal erythema.  Eyes:     Extraocular Movements: Extraocular movements intact.     Pupils: Pupils are equal, round, and reactive to light.  Cardiovascular:     Rate and Rhythm: Normal rate and regular rhythm.     Pulses: Normal pulses.     Heart sounds: Normal heart sounds. No murmur heard. Pulmonary:     Effort: Pulmonary effort is normal. No respiratory distress.     Breath sounds: Normal breath sounds. No wheezing, rhonchi or rales.  Musculoskeletal:     Right lower leg: No edema.     Left lower leg: No edema.  Skin:    General: Skin is warm and dry.     Findings: No  rash.  Neurological:     Mental Status: He is alert.  Psychiatric:        Mood and Affect: Mood normal.        Behavior: Behavior normal.         Assessment & Plan:   Problem List Items Addressed This Visit     Hypertension, essential    Chronic, stable on low dose losartan - continue.       Alcohol use    Discussed relation of alcohol use and sleep-maintenance insomnia.       Chronic insomnia - Primary    Present over the past year, predominantly sleep initiation insomnia. Reviewed sleep hygiene measures and importance of nightly bedtime routine, handout provided.  Discussed medication treatment - PRN vs nightly, controlled non-benzo hypnotics vs antidepressant option. Will trial trazodone 25-'50mg'$  nightly. Update with effect.  Insomnia handout provided. He may look into Calm-type app.         Meds ordered this encounter  Medications   traZODone (DESYREL) 50 MG tablet    Sig: Take 0.5-1 tablets (25-50 mg total) by mouth at bedtime as needed for sleep.    Dispense:  30 tablet    Refill:  3   No orders of the defined types were placed in this encounter.    Patient instructions: Try trazodone '25mg'$  at night time as needed for sleep, with option to increase to full tablet '50mg'$  at night.  Let us know how you do with this medicine.   Bedtime routine checklist: 1. Avoid naps during the day 2. Avoid stimulants such as caffeine and nicotine. Avoid bedtime alcohol (it can speed onset of sleep but the body's metabolism can cause awakenings). 3. All forms of exercise help ensure sound sleep - limit vigorous exercise to morning or late afternoon 4. Avoid food too close to bedtime including chocolate (which contains caffeine) 5. Soak up natural light 6. Establish regular bedtime routine. 7. Associate bed with sleep - avoid TV, computer or phone, reading while in bed. 8. Ensure pleasant, relaxing sleep environment - quiet, dark, cool room.   Follow up plan: Return if  symptoms worsen or fail to improve.  Ria Bush, MD

## 2022-06-19 ENCOUNTER — Telehealth: Payer: Self-pay | Admitting: Family Medicine

## 2022-06-19 NOTE — Assessment & Plan Note (Signed)
Discussed relation of alcohol use and sleep-maintenance insomnia.

## 2022-06-19 NOTE — Telephone Encounter (Signed)
Received request from [facility] Dr Alvan Dame for R hip replacement.

## 2022-06-19 NOTE — Telephone Encounter (Signed)
Pt called in stated needs surgical clearance with PCP . I have schedule him on first available on 06/28/22 the patient wants to know can he be seen sooner . Please advise [phone]

## 2022-06-19 NOTE — Telephone Encounter (Signed)
Lvm asking pt to call back.  We can offer 06/24/22 at 2:00, needs to arrive by 1:45.  Also, we need to know what office is requesting surgical clearance, since we have not received a faxed form yet for the pt.

## 2022-06-20 NOTE — Telephone Encounter (Signed)
Pt returned call and was reschedule to 06/24/22 at 2.:00

## 2022-06-20 NOTE — Telephone Encounter (Signed)
Noted  

## 2022-06-24 ENCOUNTER — Encounter: Payer: Self-pay | Admitting: Family Medicine

## 2022-06-24 ENCOUNTER — Encounter (INDEPENDENT_AMBULATORY_CARE_PROVIDER_SITE_OTHER): Payer: Self-pay

## 2022-06-24 ENCOUNTER — Ambulatory Visit (INDEPENDENT_AMBULATORY_CARE_PROVIDER_SITE_OTHER): Payer: 59 | Admitting: Family Medicine

## 2022-06-24 VITALS — BP 134/80 | HR 78 | Temp 97.2°F | Ht 68.0 in | Wt 239.5 lb

## 2022-06-24 DIAGNOSIS — Z789 Other specified health status: Secondary | ICD-10-CM

## 2022-06-24 DIAGNOSIS — I10 Essential (primary) hypertension: Secondary | ICD-10-CM | POA: Diagnosis not present

## 2022-06-24 DIAGNOSIS — R7303 Prediabetes: Secondary | ICD-10-CM

## 2022-06-24 DIAGNOSIS — I2510 Atherosclerotic heart disease of native coronary artery without angina pectoris: Secondary | ICD-10-CM | POA: Diagnosis not present

## 2022-06-24 DIAGNOSIS — Z01818 Encounter for other preprocedural examination: Secondary | ICD-10-CM

## 2022-06-24 DIAGNOSIS — K219 Gastro-esophageal reflux disease without esophagitis: Secondary | ICD-10-CM

## 2022-06-24 DIAGNOSIS — F5104 Psychophysiologic insomnia: Secondary | ICD-10-CM

## 2022-06-24 LAB — POCT GLYCOSYLATED HEMOGLOBIN (HGB A1C): Hemoglobin A1C: 6 % — AB (ref 4.0–5.6)

## 2022-06-24 NOTE — Assessment & Plan Note (Signed)
A few mixed drinks a week.

## 2022-06-24 NOTE — Assessment & Plan Note (Signed)
Continues omeprazole 53m daily.

## 2022-06-24 NOTE — Assessment & Plan Note (Signed)
Update A1c ?

## 2022-06-24 NOTE — Assessment & Plan Note (Addendum)
Obesity complicated by osteoarthritis, HTN, HLD, GERD

## 2022-06-24 NOTE — Patient Instructions (Signed)
Good to see you today  We will forward today's note to Dr Aurea Graff office I hope you have a speedy recovery!

## 2022-06-24 NOTE — Assessment & Plan Note (Signed)
Known nonobstructive CAD by prior catheterization, managed on high intensity statin by cardiology Nehemiah Massed). Cardiac clearance through him.

## 2022-06-24 NOTE — Progress Notes (Signed)
Patient ID: [name] [name], male    DOB: Apr 18, 1961, 61 y.o.   MRN: [id number]  This visit was conducted in person.  BP 134/80   Pulse 78   Temp (!) 97.2 F (36.2 C) (Temporal)   Ht '5\' 8"'$  (1.727 m)   Wt 239 lb 8 oz (108.6 kg)   SpO2 96%   BMI 36.42 kg/m    CC: preop evaluation Subjective:   HPI: [name] [name] is a 61 y.o. male presenting on 06/24/2022 for Pre-op Exam (Needs R THR.  Surgery date- TBD.)   Aldrin C Cohenour  has a past medical history of CAD (coronary artery disease), Dyslipidemia, GERD (gastroesophageal reflux disease), History of Helicobacter pylori infection, Hypertension, essential, and Obesity, Class I, BMI 30-34.9 (01/19/2016).  Planned upcoming right total hip replacement through [facility] by Dr Alvan Dame under spinal anesthesia.  Orthopedic team will order routine preop laboratories.    Patient has tolerated anesthesia well in the past.  Latest surgical intervention was R hip replacement 02/2022 by Dr Alvan Dame.  Denies trouble with post-op nausea/vomiting, or trouble awakening after surgery.   Sees cardiology Dr Nehemiah Massed last seen 10/2021. Will receive cardiac clearance through them. Had overall reassuring cardiac catheterization 07/2021.  LHC 07/2021 - 20% LAD stenosis  Denies chest pain, dyspnea, palpitations, leg swelling, HA, dizziness.  No fevers/chills, coughing, abd pain, diarrhea.  No UTI symptoms of dysuria, urgency, frequency.   Denies sleep apnea symptoms of daytime somnolence, loud snoring, paroxysmal nocturnal dyspnea, or witnessed apneas.   Non smoker No history of asthma.      Past Medical History:  Diagnosis Date   CAD (coronary artery disease)    mild   Dyslipidemia    GERD (gastroesophageal reflux disease)    with esophagitis   History of Helicobacter pylori infection    Hypertension, essential    Obesity, Class I, BMI 30-34.9 01/19/2016   Past Surgical History:  Procedure Laterality Date   ANTERIOR CERVICAL DECOMP/DISCECTOMY FUSION   2012   C4-5 ([place])   ANTERIOR CERVICAL DECOMP/DISCECTOMY FUSION N/A 03/12/2018   ACDF C4-6, EXPLORATION OF FUSION C6-7, REMOVAL OF HARDWARE;  [name], [name], MD)   ARTERY BIOPSY Left 10/20/2019   TEMPORAL ARTERY BIOPSY - Schnier, Dolores Lory, MD   CARDIAC CATHETERIZATION  2013   no significant CAD, nl LVEF 60% [name])   COLONOSCOPY  2012   WNL per PCP report   COLONOSCOPY  10/06/2021   done for rectal bleed after polypectomy s/p clip, rec rpt colonoscopy 6 months to remove polyp seen ascending colon ([place])   COLONOSCOPY WITH PROPOFOL N/A 10/04/2021   TAx2, low grade dysplasia present, hemorrhoids, diverticulosis, rpt 3 yrs (Vanga, [name], MD)   COLONOSCOPY WITH PROPOFOL N/A 10/06/2021   Procedure: COLONOSCOPY WITH PROPOFOL;  Surgeon: [name], MD;  Location: [facility];  Service: Endoscopy;  Laterality: N/A;   CT CTA CORONARY W/CA SCORE W/CM &/OR WO/CM  2015   minor luminal irregularities, Ca score = 90 [name])   CYSTOSCOPY WITH URETEROSCOPY, STONE BASKETRY AND STENT PLACEMENT Right 05/03/2022   Procedure: CYSTOSCOPY WITH URETEROSCOPY, STONE BASKETRY AND STENT PLACEMENT;  Surgeon: [name], MD;  Location: [facility] ORS;  Service: Urology;  Laterality: Right;   FLEXIBLE SIGMOIDOSCOPY N/A 10/05/2021   Procedure: FLEXIBLE SIGMOIDOSCOPY;  Surgeon: [name], MD;  Location: [facility] END[facility]COPY;  Service: Gastroenterology;  Laterality: N/A;   HOLMIUM LASER APPLICATION Right 58/83/2549   Procedure: HOLMIUM LASER APPLICATION;  Surgeon: [name], MD;  Location:  [facility] ORS;  Service: Urology;  Laterality: Right;   KNEE ARTHROSCOPY Bilateral    LEFT HEART CATH AND CORONARY ANGIOGRAPHY N/A 07/31/2021   Procedure: LEFT HEART CATH AND CORONARY ANGIOGRAPHY;  Surgeon: [name], MD;  Location: [facility];  Service: Cardiovascular;  Laterality: N/A;   REPLACEMENT TOTAL KNEE Right 02/2022   Alvan Dame   ROTATOR CUFF REPAIR Right 2019   SCLEROTHERAPY      legs    Family History  Problem Relation Age of Onset   CAD Father 44       MI   Hyperlipidemia Father    Diabetes Paternal Grandfather    Cancer Mother 32       deceased from bone cancer   Stroke Neg Hx     Social History   Tobacco Use   Smoking status: Never   Smokeless tobacco: Current    Types: Chew  Vaping Use   Vaping Use: Never used  Substance Use Topics   Alcohol use: Yes    Alcohol/week: 0.0 standard drinks of alcohol    Comment: 8 oz whiskey a night   Drug use: No     Relevant past medical, surgical, family and social history reviewed and updated as indicated. Interim medical history since our last visit reviewed. Allergies and medications reviewed and updated. Outpatient Medications Prior to Visit  Medication Sig Dispense Refill   atorvastatin (LIPITOR) 80 MG tablet Take 1 tablet (80 mg total) by mouth daily.     losartan (COZAAR) 25 MG tablet Take 1 tablet (25 mg total) by mouth daily. 90 tablet 1   omeprazole (PRILOSEC) 20 MG capsule TAKE 1 CAPSULE(20 MG) BY MOUTH DAILY 90 capsule 3   traZODone (DESYREL) 50 MG tablet Take 0.5-1 tablets (25-50 mg total) by mouth at bedtime as needed for sleep. 30 tablet 3   No facility-administered medications prior to visit.     Per HPI unless specifically indicated in ROS section below Review of Systems  Objective:  BP 134/80   Pulse 78   Temp (!) 97.2 F (36.2 C) (Temporal)   Ht '5\' 8"'$  (1.727 m)   Wt 239 lb 8 oz (108.6 kg)   SpO2 96%   BMI 36.42 kg/m   Wt Readings from Last 3 Encounters:  06/24/22 239 lb 8 oz (108.6 kg)  06/14/22 231 lb 2 oz (104.8 kg)  05/07/22 250 lb (113.4 kg)      Physical Exam Vitals and nursing note reviewed.  Constitutional:      Appearance: Normal appearance. He is not ill-appearing.  HENT:     Head: Normocephalic and atraumatic.     Mouth/Throat:     Mouth: Mucous membranes are moist.     Pharynx: Oropharynx is clear. No oropharyngeal exudate or posterior oropharyngeal  erythema.  Eyes:     Extraocular Movements: Extraocular movements intact.     Pupils: Pupils are equal, round, and reactive to light.  Neck:     Thyroid: No thyroid mass or thyromegaly.  Cardiovascular:     Rate and Rhythm: Normal rate and regular rhythm.     Pulses: Normal pulses.     Heart sounds: Normal heart sounds. No murmur heard. Pulmonary:     Effort: Pulmonary effort is normal. No respiratory distress.     Breath sounds: Normal breath sounds. No wheezing, rhonchi or rales.  Musculoskeletal:     Cervical back: Normal range of motion and neck supple.     Right lower leg: No edema.  Left lower leg: No edema.  Skin:    General: Skin is warm and dry.     Findings: No rash.  Neurological:     Mental Status: He is alert.  Psychiatric:        Mood and Affect: Mood normal.        Behavior: Behavior normal.      DG Chest 2 View CLINICAL DATA:  Preop evaluation. Knee surgery scheduled 03/25/2022.   EXAM: CHEST - 2 VIEW   COMPARISON:  07/29/2021   FINDINGS: The heart size and mediastinal contours are within normal limits. Both lungs are clear. The visualized skeletal structures are unremarkable.   IMPRESSION: No active cardiopulmonary disease.   Electronically Signed   By: Nolon Nations M.D.   On: 03/12/2022 11:03  Lab Results  Component Value Date   HGBA1C 6.0 (A) 06/24/2022    Lab Results  Component Value Date   CREATININE 0.85 04/28/2022   BUN 21 (H) 04/28/2022   NA 140 04/28/2022   K 3.6 04/28/2022   CL 102 04/28/2022   CO2 26 04/28/2022    Lab Results  Component Value Date   WBC 7.9 04/28/2022   HGB 14.3 04/28/2022   HCT 43.8 04/28/2022   MCV 93.2 04/28/2022   PLT 248 04/28/2022    Assessment & Plan:   Problem List Items Addressed This Visit     GERD (gastroesophageal reflux disease)    Continues omeprazole '20mg'$  daily.       CAD (coronary artery disease)    Known nonobstructive CAD by prior catheterization, managed on high  intensity statin by cardiology Nehemiah Massed). Cardiac clearance through him.       Hypertension, essential    Chronic, stable on low dose losartan '25mg'$  daily.       Severe obesity (BMI 35.0-39.9) with comorbidity ([place])    Obesity complicated by osteoarthritis, HTN, HLD, GERD      Alcohol use    A few mixed drinks a week.       Pre-op evaluation - Primary    RCRI = 0 Cardiac clearance should come through Dr Nehemiah Massed [facility] who he last saw 10/2021.  Recent CXR 02/2022 without acute findings.  BP now well controlled.  Anticipate adequately low risk to proceed with planned surgical intervention.       Prediabetes    Update A1c.       Relevant Orders   POCT glycosylated hemoglobin (Hb A1C) (Completed)   Chronic insomnia    Doing better on trazodone '50mg'$  nightly.         No orders of the defined types were placed in this encounter.  Orders Placed This Encounter  Procedures   POCT glycosylated hemoglobin (Hb A1C)    Patient Instructions  Good to see you today  We will forward today's note to Dr Aurea Graff office I hope you have a speedy recovery!   Follow up plan: Return if symptoms worsen or fail to improve.  Ria Bush, MD

## 2022-06-24 NOTE — Assessment & Plan Note (Signed)
RCRI = 0 Cardiac clearance should come through Dr Nehemiah Massed [facility] who he last saw 10/2021.  Recent CXR 02/2022 without acute findings.  BP now well controlled.  Anticipate adequately low risk to proceed with planned surgical intervention.

## 2022-06-24 NOTE — Assessment & Plan Note (Signed)
Chronic, stable on low dose losartan '25mg'$  daily.

## 2022-06-24 NOTE — Assessment & Plan Note (Signed)
Doing better on trazodone '50mg'$  nightly.

## 2022-06-26 ENCOUNTER — Telehealth: Payer: Self-pay | Admitting: Family Medicine

## 2022-06-26 DIAGNOSIS — Z01818 Encounter for other preprocedural examination: Secondary | ICD-10-CM

## 2022-06-26 DIAGNOSIS — M1611 Unilateral primary osteoarthritis, right hip: Secondary | ICD-10-CM

## 2022-06-26 NOTE — Telephone Encounter (Addendum)
Received lab orders from Dr Barnetta Chapel asking for CBC, CMP.  I had thought their office was going to order based on preop request form.  Ok to do - I have ordered.  Will need to fax back lab results.

## 2022-06-27 NOTE — Telephone Encounter (Addendum)
Spoke with [name] [name] relaying Dr. Synthia Innocent message.  [name] [name] verbalizes understanding and scheduled lab visit on 06/28/22 at 3:00.  Fax results go to [facility] at [phone], attn:  Orson Slick.  [Lab order in basket on Lisa's desk.]

## 2022-06-28 ENCOUNTER — Other Ambulatory Visit (INDEPENDENT_AMBULATORY_CARE_PROVIDER_SITE_OTHER): Payer: 59

## 2022-06-28 ENCOUNTER — Ambulatory Visit: Payer: 59 | Admitting: Family Medicine

## 2022-06-28 DIAGNOSIS — M1611 Unilateral primary osteoarthritis, right hip: Secondary | ICD-10-CM

## 2022-06-28 DIAGNOSIS — Z01818 Encounter for other preprocedural examination: Secondary | ICD-10-CM

## 2022-06-28 NOTE — Addendum Note (Signed)
Addended by: Ellamae Sia on: 06/28/2022 02:49 PM   Modules accepted: Orders

## 2022-06-29 LAB — COMPREHENSIVE METABOLIC PANEL
AG Ratio: 1.8 (calc) (ref 1.0–2.5)
ALT: 22 U/L (ref 9–46)
AST: 14 U/L (ref 10–35)
Albumin: 3.9 g/dL (ref 3.6–5.1)
Alkaline phosphatase (APISO): 122 U/L (ref 35–144)
BUN: 11 mg/dL (ref 7–25)
CO2: 27 mmol/L (ref 20–32)
Calcium: 9 mg/dL (ref 8.6–10.3)
Chloride: 105 mmol/L (ref 98–110)
Creat: 0.7 mg/dL (ref 0.70–1.35)
Globulin: 2.2 g/dL (calc) (ref 1.9–3.7)
Glucose, Bld: 117 mg/dL — ABNORMAL HIGH (ref 65–99)
Potassium: 4.2 mmol/L (ref 3.5–5.3)
Sodium: 143 mmol/L (ref 135–146)
Total Bilirubin: 0.4 mg/dL (ref 0.2–1.2)
Total Protein: 6.1 g/dL (ref 6.1–8.1)

## 2022-06-29 LAB — CBC WITH DIFFERENTIAL/PLATELET
Absolute Monocytes: 558 cells/uL (ref 200–950)
Basophils Absolute: 41 cells/uL (ref 0–200)
Basophils Relative: 0.6 %
Eosinophils Absolute: 129 cells/uL (ref 15–500)
Eosinophils Relative: 1.9 %
HCT: 40.2 % (ref 38.5–50.0)
Hemoglobin: 13.9 g/dL (ref 13.2–17.1)
Lymphs Abs: 2094 cells/uL (ref 850–3900)
MCH: 31.5 pg (ref 27.0–33.0)
MCHC: 34.6 g/dL (ref 32.0–36.0)
MCV: 91.2 fL (ref 80.0–100.0)
MPV: 10.4 fL (ref 7.5–12.5)
Monocytes Relative: 8.2 %
Neutro Abs: 3978 cells/uL (ref 1500–7800)
Neutrophils Relative %: 58.5 %
Platelets: 231 10*3/uL (ref 140–400)
RBC: 4.41 10*6/uL (ref 4.20–5.80)
RDW: 13.1 % (ref 11.0–15.0)
Total Lymphocyte: 30.8 %
WBC: 6.8 10*3/uL (ref 3.8–10.8)

## 2022-07-01 ENCOUNTER — Encounter: Payer: Self-pay | Admitting: Gastroenterology

## 2022-07-01 NOTE — Telephone Encounter (Addendum)
Faxed results to Orson Slick at [place] at [phone].

## 2022-07-22 ENCOUNTER — Ambulatory Visit: Payer: 59 | Admitting: General Practice

## 2022-07-22 ENCOUNTER — Ambulatory Visit
Admission: RE | Disposition: A | Discharge status: Home / Self Care | Payer: Self-pay | Source: Home / Self Care | Attending: Gastroenterology

## 2022-07-22 ENCOUNTER — Encounter: Payer: Self-pay | Admitting: Gastroenterology

## 2022-07-22 ENCOUNTER — Ambulatory Visit
Admission: RE | Admit: 2022-07-22 | Discharge: 2022-07-22 | Disposition: A | Discharge status: Home / Self Care | Payer: 59 | Attending: Gastroenterology | Admitting: Gastroenterology

## 2022-07-22 ENCOUNTER — Other Ambulatory Visit: Payer: Self-pay

## 2022-07-22 DIAGNOSIS — K640 First degree hemorrhoids: Secondary | ICD-10-CM | POA: Insufficient documentation

## 2022-07-22 DIAGNOSIS — F17220 Nicotine dependence, chewing tobacco, uncomplicated: Secondary | ICD-10-CM | POA: Diagnosis not present

## 2022-07-22 DIAGNOSIS — K621 Rectal polyp: Secondary | ICD-10-CM | POA: Insufficient documentation

## 2022-07-22 DIAGNOSIS — Z79899 Other long term (current) drug therapy: Secondary | ICD-10-CM | POA: Diagnosis not present

## 2022-07-22 DIAGNOSIS — I2510 Atherosclerotic heart disease of native coronary artery without angina pectoris: Secondary | ICD-10-CM | POA: Insufficient documentation

## 2022-07-22 DIAGNOSIS — E785 Hyperlipidemia, unspecified: Secondary | ICD-10-CM | POA: Diagnosis not present

## 2022-07-22 DIAGNOSIS — Z86010 Personal history of colonic polyps: Secondary | ICD-10-CM | POA: Insufficient documentation

## 2022-07-22 DIAGNOSIS — D122 Benign neoplasm of ascending colon: Secondary | ICD-10-CM | POA: Insufficient documentation

## 2022-07-22 DIAGNOSIS — I10 Essential (primary) hypertension: Secondary | ICD-10-CM | POA: Diagnosis not present

## 2022-07-22 DIAGNOSIS — E669 Obesity, unspecified: Secondary | ICD-10-CM | POA: Diagnosis not present

## 2022-07-22 DIAGNOSIS — G709 Myoneural disorder, unspecified: Secondary | ICD-10-CM | POA: Diagnosis not present

## 2022-07-22 DIAGNOSIS — K635 Polyp of colon: Secondary | ICD-10-CM | POA: Diagnosis not present

## 2022-07-22 DIAGNOSIS — Z09 Encounter for follow-up examination after completed treatment for conditions other than malignant neoplasm: Secondary | ICD-10-CM | POA: Insufficient documentation

## 2022-07-22 DIAGNOSIS — Z6835 Body mass index (BMI) 35.0-35.9, adult: Secondary | ICD-10-CM | POA: Diagnosis not present

## 2022-07-22 HISTORY — PX: POLYPECTOMY: SHX5525

## 2022-07-22 HISTORY — PX: COLONOSCOPY WITH PROPOFOL: SHX5780

## 2022-07-22 SURGERY — COLONOSCOPY WITH PROPOFOL
Anesthesia: General | Site: Rectum

## 2022-07-22 MED ORDER — PROPOFOL 10 MG/ML IV BOLUS
INTRAVENOUS | Status: DC | PRN
  Administered 2022-07-22 (×3): 100 mg via INTRAVENOUS

## 2022-07-22 MED ORDER — SODIUM CHLORIDE 0.9 % IV SOLN: INTRAVENOUS | Status: DC

## 2022-07-22 MED ORDER — LACTATED RINGERS IV SOLN: INTRAVENOUS | Status: DC

## 2022-07-22 MED ORDER — ONDANSETRON HCL 4 MG/2ML IJ SOLN: 4.0000 mg | Freq: Once | INTRAMUSCULAR | Status: DC | PRN

## 2022-07-22 MED ORDER — STERILE WATER FOR IRRIGATION IR SOLN
Status: DC | PRN
  Administered 2022-07-22: 100 mL

## 2022-07-22 MED ORDER — FENTANYL CITRATE PF 50 MCG/ML IJ SOSY: 25.0000 ug | PREFILLED_SYRINGE | INTRAMUSCULAR | Status: DC | PRN

## 2022-07-22 SURGICAL SUPPLY — 7 items
FORCEPS BIOP RAD 4 LRG CAP 4 (CUTTING FORCEPS) IMPLANT
GOWN CVR UNV OPN BCK APRN NK (MISCELLANEOUS) ×2 IMPLANT
GOWN ISOL THUMB LOOP REG UNIV (MISCELLANEOUS) ×2
KIT PRC NS LF DISP ENDO (KITS) ×1 IMPLANT
KIT PROCEDURE OLYMPUS (KITS) ×1
MANIFOLD NEPTUNE II (INSTRUMENTS) ×1 IMPLANT
WATER STERILE IRR 250ML POUR (IV SOLUTION) ×1 IMPLANT

## 2022-07-22 NOTE — Anesthesia Postprocedure Evaluation (Signed)
Anesthesia Post Note  Patient: [name] [name]  Procedure(s) Performed: COLONOSCOPY WITH BIOPSY (Rectum) POLYPECTOMY (Rectum)  Patient location during evaluation: PACU Anesthesia Type: General Level of consciousness: awake and alert Pain management: pain level controlled Vital Signs Assessment: post-procedure vital signs reviewed and stable Respiratory status: spontaneous breathing, nonlabored ventilation, respiratory function stable and patient connected to nasal cannula oxygen Cardiovascular status: blood pressure returned to baseline and stable Postop Assessment: no apparent nausea or vomiting Anesthetic complications: no   No notable events documented.   Last Vitals:  Vitals:   07/22/22 0900 07/22/22 0905  BP: (!) 129/95 (!) 134/90  Pulse: 79 69  Resp: (!) 21 17  Temp:  36.6 C  SpO2: 95% 97%    Last Pain:  Vitals:   07/22/22 0905  TempSrc:   PainSc: 0-No pain                 Molli Barrows

## 2022-07-22 NOTE — Anesthesia Preprocedure Evaluation (Signed)
Anesthesia Evaluation  Patient identified by MRN, date of birth, ID band Patient awake    Reviewed: Allergy & Precautions, H&P , NPO status , [name] Chart, lab work & pertinent test results, reviewed documented beta blocker date and time   Airway Mallampati: II   Neck ROM: full    Dental  (+) Poor Dentition   Pulmonary neg pulmonary ROS   Pulmonary exam normal        Cardiovascular Exercise Tolerance: Poor hypertension, On Medications + angina with exertion + CAD  Normal cardiovascular exam Rhythm:regular Rate:Normal     Neuro/Psych  Headaches  Neuromuscular disease  negative psych ROS   GI/Hepatic Neg liver ROS,GERD  Medicated,,  Endo/Other  negative endocrine ROS    Renal/GU negative Renal ROS  negative genitourinary   Musculoskeletal   Abdominal   Peds  Hematology negative hematology ROS (+)   Anesthesia Other Findings Past Medical History: No date: CAD (coronary artery disease)     Comment:  mild No date: Dyslipidemia No date: GERD (gastroesophageal reflux disease)     Comment:  with esophagitis No date: History of Helicobacter pylori infection No date: Hypertension, essential 01/19/2016: Obesity, Class I, BMI 30-34.9 Past Surgical History: 2012: ANTERIOR CERVICAL DECOMP/DISCECTOMY FUSION     Comment:  C4-5 ([place]) 03/12/2018: ANTERIOR CERVICAL DECOMP/DISCECTOMY FUSION; N/A     Comment:  ACDF C4-6, EXPLORATION OF FUSION C6-7, REMOVAL OF               HARDWARE;  [name], Duane Lope, MD) 10/20/2019: ARTERY BIOPSY; Left     Comment:  TEMPORAL ARTERY BIOPSY - Schnier, Dolores Lory, MD 2013: CARDIAC CATHETERIZATION     Comment:  no significant CAD, nl LVEF 60% [name]) 2012: COLONOSCOPY     Comment:  WNL per PCP report 10/06/2021: COLONOSCOPY     Comment:  done for rectal bleed after polypectomy s/p clip, rec               rpt colonoscopy 6 months to remove polyp seen ascending               colon  Haig Prophet) 10/04/2021: COLONOSCOPY WITH PROPOFOL; N/A     Comment:  TAx2, low grade dysplasia present, hemorrhoids,               diverticulosis, rpt 3 yrs [name], [name], MD) 10/06/2021: COLONOSCOPY WITH PROPOFOL; N/A     Comment:  Procedure: COLONOSCOPY WITH PROPOFOL;  Surgeon:               [name], MD;  Location: [facility];                Service: Endoscopy;  Laterality: N/A; 2015: CT CTA CORONARY W/CA SCORE W/CM &/OR WO/CM     Comment:  minor luminal irregularities, Ca score = 90 [name]) 05/03/2022: CYSTOSCOPY WITH URETEROSCOPY, STONE BASKETRY AND STENT  PLACEMENT; Right     Comment:  Procedure: CYSTOSCOPY WITH URETEROSCOPY, STONE BASKETRY               AND STENT PLACEMENT;  Surgeon: [name], MD;                Location: [facility];  Service: Urology;  Laterality:               Right; 10/05/2021: FLEXIBLE SIGMOIDOSCOPY; N/A     Comment:  Procedure: FLEXIBLE SIGMOIDOSCOPY;  Surgeon: [name], MD;  Location: [facility];  Service:               Gastroenterology;  Laterality: N/A; 05/03/2022: HOLMIUM LASER APPLICATION; Right     Comment:  Procedure: HOLMIUM LASER APPLICATION;  Surgeon: [name], MD;  Location: [facility];  Service: Urology;                Laterality: Right; No date: KNEE ARTHROSCOPY; Bilateral 07/31/2021: LEFT HEART CATH AND CORONARY ANGIOGRAPHY; N/A     Comment:  Procedure: LEFT HEART CATH AND CORONARY ANGIOGRAPHY;                Surgeon: [name], MD;  Location: [facility];  Service: Cardiovascular;  Laterality: N/A; 02/2022: REPLACEMENT TOTAL KNEE; Right     Comment:  [place] 2019: ROTATOR CUFF REPAIR; Right No date: SCLEROTHERAPY     Comment:  legs BMI    Body Mass Index: 35.28 kg/m     Reproductive/Obstetrics negative OB ROS                             Anesthesia Physical Anesthesia Plan  ASA: 3  Anesthesia Plan:  General   Post-op Pain Management:    Induction:   PONV Risk Score and Plan:   Airway Management Planned:   Additional Equipment:   Intra-op Plan:   Post-operative Plan:   Informed Consent: I have reviewed the patients History and Physical, chart, labs and discussed the procedure including the risks, benefits and alternatives for the proposed anesthesia with the patient or authorized representative who has indicated his/her understanding and acceptance.     Dental Advisory Given  Plan Discussed with: CRNA  Anesthesia Plan Comments:        Anesthesia Quick Evaluation

## 2022-07-22 NOTE — Op Note (Signed)
[facility] Gastroenterology Patient Name: [name] [name] Procedure Date: 07/22/2022 8:18 AM MRN: [id number] Account #: [id number] Date of Birth: 01/14/1961 Admit Type: Outpatient Age: 61 Room: [facility] OR ROOM 01 Gender: Male Note Status: Finalized Instrument Name: [id number] Procedure:             Colonoscopy Indications:           High risk colon cancer surveillance: Personal history                         of colonic polyps Providers:             Lucilla Lame MD, MD Referring MD:          Ria Bush (Referring MD) Medicines:             Propofol per Anesthesia Complications:         No immediate complications. Procedure:             Pre-Anesthesia Assessment:                        - Prior to the procedure, a History and Physical was                         performed, and patient medications and allergies were                         reviewed. The patient's tolerance of previous                         anesthesia was also reviewed. The risks and benefits                         of the procedure and the sedation options and risks                         were discussed with the patient. All questions were                         answered, and informed consent was obtained. Prior                         Anticoagulants: The patient has taken no anticoagulant                         or antiplatelet agents. ASA Grade Assessment: II - A                         patient with mild systemic disease. After reviewing                         the risks and benefits, the patient was deemed in                         satisfactory condition to undergo the procedure.                        After obtaining informed consent, the colonoscope was  passed under direct vision. Throughout the procedure,                         the patient's blood pressure, pulse, and oxygen                         saturations were monitored continuously. The                          Colonoscope was introduced through the anus and                         advanced to the the cecum, identified by appendiceal                         orifice and ileocecal valve. The colonoscopy was                         performed without difficulty. The patient tolerated                         the procedure well. The quality of the bowel                         preparation was excellent. Findings:      Two sessile polyps were found in the ascending colon. The polyps were 1       to 3 mm in size. These polyps were removed with a cold biopsy forceps.       Resection and retrieval were complete.      A 3 mm polyp was found in the recto-sigmoid colon. The polyp was       sessile. The polyp was removed with a cold biopsy forceps. Resection and       retrieval were complete.      Non-bleeding internal hemorrhoids were found during retroflexion. The       hemorrhoids were Grade I (internal hemorrhoids that do not prolapse). Impression:            - Two 1 to 3 mm polyps in the ascending colon, removed                         with a cold biopsy forceps. Resected and retrieved.                        - One 3 mm polyp at the recto-sigmoid colon, removed                         with a cold biopsy forceps. Resected and retrieved.                        - Non-bleeding internal hemorrhoids. Recommendation:        - Discharge patient to home.                        - Resume previous diet.                        - Continue present medications.                        -  Await pathology results.                        - Repeat colonoscopy in 3 years for surveillance. Procedure Code(s):     --- Professional ---                        [phone], Colonoscopy, flexible; with biopsy, single or                         multiple Diagnosis Code(s):     --- Professional ---                        Z86.010, Personal history of colonic polyps                        D12.2, Benign neoplasm of ascending colon CPT  copyright 2022 American Medical Association. All rights reserved. The codes documented in this report are preliminary and upon coder review may  be revised to meet current compliance requirements. Lucilla Lame MD, MD 07/22/2022 8:50:34 AM This report has been signed electronically. Number of Addenda: 0 Note Initiated On: 07/22/2022 8:18 AM Scope Withdrawal Time: 0 hours 12 minutes 14 seconds  Total Procedure Duration: 0 hours 16 minutes 4 seconds  Estimated Blood Loss:  Estimated blood loss: none.      [facility]

## 2022-07-22 NOTE — H&P (Signed)
Lucilla Lame, MD [facility] [address] [place], [place] [address] Phone:[phone] Fax : [phone]  Primary Care Physician:  Ria Bush, MD Primary Gastroenterologist:  Dr. Allen Norris  Pre-Procedure History & Physical: HPI:  [name] [name] is a 61 y.o. male is here for an colonoscopy.   Past Medical History:  Diagnosis Date   CAD (coronary artery disease)    mild   Dyslipidemia    GERD (gastroesophageal reflux disease)    with esophagitis   History of Helicobacter pylori infection    Hypertension, essential    Obesity, Class I, BMI 30-34.9 01/19/2016    Past Surgical History:  Procedure Laterality Date   ANTERIOR CERVICAL DECOMP/DISCECTOMY FUSION  2012   C4-5 ([place])   ANTERIOR CERVICAL DECOMP/DISCECTOMY FUSION N/A 03/12/2018   ACDF C4-6, EXPLORATION OF FUSION C6-7, REMOVAL OF HARDWARE;  [name], [name], MD)   ARTERY BIOPSY Left 10/20/2019   TEMPORAL ARTERY BIOPSY - Schnier, Dolores Lory, MD   CARDIAC CATHETERIZATION  2013   no significant CAD, nl LVEF 60% [name])   COLONOSCOPY  2012   WNL per PCP report   COLONOSCOPY  10/06/2021   done for rectal bleed after polypectomy s/p clip, rec rpt colonoscopy 6 months to remove polyp seen ascending colon ([place])   COLONOSCOPY WITH PROPOFOL N/A 10/04/2021   TAx2, low grade dysplasia present, hemorrhoids, diverticulosis, rpt 3 yrs (Vanga, [name], MD)   COLONOSCOPY WITH PROPOFOL N/A 10/06/2021   Procedure: COLONOSCOPY WITH PROPOFOL;  Surgeon: [name], MD;  Location: [facility];  Service: Endoscopy;  Laterality: N/A;   CT CTA CORONARY W/CA SCORE W/CM &/OR WO/CM  2015   minor luminal irregularities, Ca score = 90 [name])   CYSTOSCOPY WITH URETEROSCOPY, STONE BASKETRY AND STENT PLACEMENT Right 05/03/2022   Procedure: CYSTOSCOPY WITH URETEROSCOPY, STONE BASKETRY AND STENT PLACEMENT;  Surgeon: [name], MD;  Location: [facility];  Service: Urology;  Laterality: Right;   FLEXIBLE SIGMOIDOSCOPY N/A  10/05/2021   Procedure: FLEXIBLE SIGMOIDOSCOPY;  Surgeon: [name], MD;  Location: [facility];  Service: Gastroenterology;  Laterality: N/A;   HOLMIUM LASER APPLICATION Right 71/01/2693   Procedure: HOLMIUM LASER APPLICATION;  Surgeon: [name], MD;  Location: [facility];  Service: Urology;  Laterality: Right;   KNEE ARTHROSCOPY Bilateral    LEFT HEART CATH AND CORONARY ANGIOGRAPHY N/A 07/31/2021   Procedure: LEFT HEART CATH AND CORONARY ANGIOGRAPHY;  Surgeon: [name], MD;  Location: [facility];  Service: Cardiovascular;  Laterality: N/A;   REPLACEMENT TOTAL KNEE Right 02/2022   Ralene Ok CUFF REPAIR Right 2019   SCLEROTHERAPY     legs    Prior to Admission medications   Medication Sig Start Date End Date Taking? Authorizing Provider  atorvastatin (LIPITOR) 80 MG tablet Take 1 tablet (80 mg total) by mouth daily. 03/12/22  Yes Ria Bush, MD  losartan (COZAAR) 25 MG tablet Take 1 tablet (25 mg total) by mouth daily. 03/20/22  Yes Ria Bush, MD  omeprazole (PRILOSEC) 20 MG capsule TAKE 1 CAPSULE(20 MG) BY MOUTH DAILY 10/11/21  Yes Ria Bush, MD  traZODone (DESYREL) 50 MG tablet Take 0.5-1 tablets (25-50 mg total) by mouth at bedtime as needed for sleep. 06/14/22  Yes Ria Bush, MD    Allergies as of 04/12/2022 - Review Complete 03/20/2022  Allergen Reaction Noted   Isosorbide nitrate Other (See Comments) and Shortness Of Breath 09/03/2021   Metoprolol tartrate Other (See Comments) and Shortness Of Breath 09/03/2021  Chlorthalidone Other (See Comments) 03/11/2022    Family History  Problem Relation Age of Onset   CAD Father 57       MI   Hyperlipidemia Father    Diabetes Paternal Grandfather    Cancer Mother 15       deceased from bone cancer   Stroke Neg Hx     Social History   Socioeconomic History   Marital status: Married    Spouse name: Not on file   Number of children: Not on file   Years of  education: Not on file   Highest education level: Not on file  Occupational History   Not on file  Tobacco Use   Smoking status: Never   Smokeless tobacco: Current    Types: Snuff  Vaping Use   Vaping Use: Never used  Substance and Sexual Activity   Alcohol use: Yes    Alcohol/week: 0.0 standard drinks of alcohol    Comment: 8 oz whiskey a night   Drug use: No   Sexual activity: Not on file  Other Topics Concern   Not on file  Social History Narrative   Lives with wife, 1 dog   Edu: HS   Occ: maintenance   Activity: active at work   Diet: some water, fruits daily, beer   Social Determinants of Health   Financial Resource Strain: Not on file  Food Insecurity: Not on file  Transportation Needs: Not on file  Physical Activity: Not on file  Stress: Not on file  Social Connections: Not on file  Intimate Partner Violence: Not on file    Review of Systems: See HPI, otherwise negative ROS  Physical Exam: BP (!) 161/91   Pulse 75   Temp 97.8 F (36.6 C) (Temporal)   Ht '5\' 8"'$  (1.727 m)   Wt 105.2 kg   SpO2 96%   BMI 35.28 kg/m  General:   Alert,  pleasant and cooperative in NAD Head:  Normocephalic and atraumatic. Neck:  Supple; no masses or thyromegaly. Lungs:  Clear throughout to auscultation.    Heart:  Regular rate and rhythm. Abdomen:  Soft, nontender and nondistended. Normal bowel sounds, without guarding, and without rebound.   Neurologic:  Alert and  oriented x4;  grossly normal neurologically.  Impression/Plan: [name] [name] is here for an colonoscopy to be performed for a history of adenomatous polyps on 2023   Risks, benefits, limitations, and alternatives regarding  colonoscopy have been reviewed with the patient.  Questions have been answered.  All parties agreeable.   Lucilla Lame, MD  07/22/2022, 8:22 AM

## 2022-07-22 NOTE — Transfer of Care (Signed)
Immediate Anesthesia Transfer of Care Note  Patient: [name] [name]  Procedure(s) Performed: COLONOSCOPY WITH BIOPSY (Rectum) POLYPECTOMY (Rectum)  Patient Location: PACU  Anesthesia Type: General  Level of Consciousness: awake, alert  and patient cooperative  Airway and Oxygen Therapy: Patient Spontanous Breathing and Patient connected to supplemental oxygen  Post-op Assessment: Post-op Vital signs reviewed, [name] Cardiovascular Status Stable, Respiratory Function Stable, Patent Airway and No signs of Nausea or vomiting  Post-op Vital Signs: Reviewed and stable  Complications: No notable events documented.

## 2022-07-23 ENCOUNTER — Encounter: Payer: Self-pay | Admitting: Gastroenterology

## 2022-07-24 LAB — SURGICAL PATHOLOGY

## 2022-07-25 ENCOUNTER — Encounter: Payer: Self-pay | Admitting: Gastroenterology

## 2022-07-26 ENCOUNTER — Encounter: Payer: Self-pay | Admitting: Family Medicine

## 2022-07-28 ENCOUNTER — Other Ambulatory Visit: Payer: Self-pay | Admitting: Family Medicine

## 2022-07-28 DIAGNOSIS — E785 Hyperlipidemia, unspecified: Secondary | ICD-10-CM

## 2022-07-28 DIAGNOSIS — R7303 Prediabetes: Secondary | ICD-10-CM

## 2022-08-02 ENCOUNTER — Other Ambulatory Visit: Payer: 59

## 2022-08-09 ENCOUNTER — Encounter: Payer: 59 | Admitting: Family Medicine

## 2022-08-14 ENCOUNTER — Other Ambulatory Visit: Payer: Self-pay | Admitting: Family Medicine

## 2022-08-28 NOTE — Progress Notes (Signed)
08/29/2022 1:47 PM   [name] [name] [birth year] [id number]  Referring provider: Ria Bush, MD [address] [place],  [place] [address]  Urological history: 1. Nephrolithiasis -stone composition unknown -right URS (05/03/2022)   2. BPH with LU TS -PSA (07/2021) 0.21 -cysto (04/2022) - small prostate w/ small median lobe   Chief Complaint  Patient presents with   Follow-up   Nephrolithiasis    HPI: [name] [name] is a 62 y.o. male who presents today for kidney stone.    He just had hip replacement last month on the right.  He is now having right groin pain that radiates to the right lower back.  He does not feel this is pain due to his hip.  He describes the pain as a sharp, constant pain which is reminiscent of his previous stone.   The pain has been occurring for the last three weeks.  He has had some mild nausea.  Patient denies any modifying or aggravating factors.  Patient denies any gross hematuria, dysuria or suprapubic/flank pain.  Patient denies any fevers, chills or vomiting.    KUB no stones seen.    UA yellow clear, trace ketone, specific gravity 1.025, pH 5.5, trace protein, 0-5 WBCs, 0-2 RBCs, 0-10 epithelial cells and a few bacteria.   PMH: Past Medical History:  Diagnosis Date   CAD (coronary artery disease)    mild   Dyslipidemia    GERD (gastroesophageal reflux disease)    with esophagitis   History of Helicobacter pylori infection    Hypertension, essential    Obesity, Class I, BMI 30-34.9 01/19/2016    Surgical History: Past Surgical History:  Procedure Laterality Date   ANTERIOR CERVICAL DECOMP/DISCECTOMY FUSION  2012   C4-5 ([place])   ANTERIOR CERVICAL DECOMP/DISCECTOMY FUSION N/A 03/12/2018   ACDF C4-6, EXPLORATION OF FUSION C6-7, REMOVAL OF HARDWARE;  [name], [name], MD)   ARTERY BIOPSY Left 10/20/2019   TEMPORAL ARTERY BIOPSY - [name], Dolores Lory, MD   CARDIAC CATHETERIZATION  2013   no significant CAD, nl LVEF 60% [name])    COLONOSCOPY  2012   WNL per PCP report   COLONOSCOPY  10/06/2021   done for rectal bleed after polypectomy s/p clip, rec rpt colonoscopy 6 months to remove polyp seen ascending colon ([facility])   COLONOSCOPY WITH PROPOFOL N/A 10/04/2021   TAx2, low grade dysplasia present, hemorrhoids, diverticulosis, rpt 3 yrs (Vanga, [name], MD)   COLONOSCOPY WITH PROPOFOL N/A 10/06/2021   Procedure: COLONOSCOPY WITH PROPOFOL;  Surgeon: [name], MD;  Location: [facility];  Service: Endoscopy;  Laterality: N/A;   COLONOSCOPY WITH PROPOFOL N/A 07/22/2022   TAs, int hem, rpt 3 yrs Allen Norris, Darren, MD)   CT CTA CORONARY W/CA SCORE W/CM &/OR WO/CM  2015   minor luminal irregularities, Ca score = 90 [name])   CYSTOSCOPY WITH URETEROSCOPY, STONE BASKETRY AND STENT PLACEMENT Right 05/03/2022   Procedure: CYSTOSCOPY WITH URETEROSCOPY, STONE BASKETRY AND STENT PLACEMENT;  Surgeon: [name], MD;  Location: [facility];  Service: Urology;  Laterality: Right;   FLEXIBLE SIGMOIDOSCOPY N/A 10/05/2021   Procedure: FLEXIBLE SIGMOIDOSCOPY;  Surgeon: [name], MD;  Location: [facility];  Service: Gastroenterology;  Laterality: N/A;   HOLMIUM LASER APPLICATION Right 77/82/4235   Procedure: HOLMIUM LASER APPLICATION;  Surgeon: [name], MD;  Location: [facility];  Service: Urology;  Laterality: Right;   KNEE ARTHROSCOPY Bilateral    LEFT HEART CATH AND CORONARY ANGIOGRAPHY N/A 07/31/2021  Procedure: LEFT HEART CATH AND CORONARY ANGIOGRAPHY;  Surgeon: [name], MD;  Location: [place] [facility];  Service: Cardiovascular;  Laterality: N/A;   POLYPECTOMY N/A 07/22/2022   Procedure: POLYPECTOMY;  Surgeon: Lucilla Lame, MD;  Location: [place];  Service: Endoscopy;  Laterality: N/A;   REPLACEMENT TOTAL KNEE Right 02/2022   Ralene Ok CUFF REPAIR Right 2019   SCLEROTHERAPY     legs    Home Medications:  Allergies as of 08/29/2022       Reactions    Isosorbide Nitrate Other (See Comments), Shortness Of Breath   Metoprolol Tartrate Other (See Comments), Shortness Of Breath   Chlorthalidone Other (See Comments)   Dizziness, palpitations        Medication List        Accurate as of August 29, 2022  1:47 PM. If you have any questions, ask your nurse or doctor.          atorvastatin 80 MG tablet Commonly known as: LIPITOR Take 1 tablet (80 mg total) by mouth daily.   losartan 25 MG tablet Commonly known as: COZAAR TAKE 1 TABLET(25 MG) BY MOUTH DAILY   omeprazole 20 MG capsule Commonly known as: PRILOSEC TAKE 1 CAPSULE(20 MG) BY MOUTH DAILY   oxycodone 5 MG capsule Commonly known as: OXY-IR Take 1 capsule (5 mg total) by mouth every 4 (four) hours as needed. Started by: Zara Council, PA-C   tadalafil 5 MG tablet Commonly known as: CIALIS Take 1 tablet (5 mg total) by mouth daily as needed for erectile dysfunction. Started by: Zara Council, PA-C   traZODone 50 MG tablet Commonly known as: DESYREL Take 0.5-1 tablets (25-50 mg total) by mouth at bedtime as needed for sleep.        Allergies:  Allergies  Allergen Reactions   Isosorbide Nitrate Other (See Comments) and Shortness Of Breath   Metoprolol Tartrate Other (See Comments) and Shortness Of Breath   Chlorthalidone Other (See Comments)    Dizziness, palpitations    Family History: Family History  Problem Relation Age of Onset   CAD Father 44       MI   Hyperlipidemia Father    Diabetes Paternal Grandfather    Cancer Mother 90       deceased from bone cancer   Stroke Neg Hx     Social History:  reports that he has never smoked. He has been exposed to tobacco smoke. His smokeless tobacco use includes snuff. He reports current alcohol use. He reports that he does not use drugs.  ROS: Pertinent ROS in HPI  Physical Exam: BP (!) 170/92   Pulse (!) 114   Ht '5\' 8"'$  (1.727 m)   Wt 230 lb (104.3 kg)   BMI 34.97 kg/m   Constitutional:  Well  nourished. Alert and oriented, No acute distress. HEENT: [place] AT, moist mucus membranes.  Trachea midline Cardiovascular: No clubbing, cyanosis, or edema. Respiratory: Normal respiratory effort, no increased work of breathing. GNeurologic: Grossly intact, no focal deficits, moving all 4 extremities. Psychiatric: Normal mood and affect.  Laboratory Data: Lab Results  Component Value Date   WBC 6.8 06/28/2022   HGB 13.9 06/28/2022   HCT 40.2 06/28/2022   MCV 91.2 06/28/2022   PLT 231 06/28/2022    Lab Results  Component Value Date   CREATININE 0.70 06/28/2022    Lab Results  Component Value Date   PSA 0.21 08/07/2021   PSA 0.2 10/08/2019   PSA 0.25  02/13/2018    Lab Results  Component Value Date   HGBA1C 6.0 (A) 06/24/2022    Lab Results  Component Value Date   AST 14 06/28/2022   Lab Results  Component Value Date   ALT 22 06/28/2022    Urinalysis See EPIC and HPI I have reviewed the labs.   Pertinent Imaging: CLINICAL DATA:  Renal stone, right flank pain   EXAM: ABDOMEN - 1 VIEW   COMPARISON:  Previous studies including abdomen radiograph done on 05/01/2022 and CT done on 04/26/2022   FINDINGS: Bowel gas pattern is nonspecific. No abnormal masses or calcifications are noted. Kidneys are partly obscured by bowel contents. He is small linear calcification in left side of pelvis most likely is vascular. There is previous right hip arthroplasty. Degenerative changes are noted in lower thoracic spine and lumbar spine with bony spurs.   IMPRESSION: Nonspecific bowel gas pattern. No abnormal masses or calcifications are seen.     Electronically Signed   By: Elmer Picker M.D.   On: 08/30/2022 16:03 I have independently reviewed the films.    Assessment & Plan:    1. Nephrolithiasis -KUB did not notify any ureteral stones, but he does have history of radio lucent stones -Will treat clinically while CT renal stone is pending -He has allergies  to chlorthalidone, so I sent a prescription for tadalafil 5 mg daily to facilitate stone passage -I also gave him a short refill on the oxycodone 5 mg IR -Discussed return precautions -CT renal stone study is ordered -UA benign -Urine culture pending  2. Right lower quadrant pain -see # 1  Return for Pending CT renal stone study results.  These notes generated with voice recognition software. I apologize for typographical errors.  [place], [place] [address]  [place], [place] [address] [phone]

## 2022-08-29 ENCOUNTER — Other Ambulatory Visit: Payer: Self-pay | Admitting: *Deleted

## 2022-08-29 ENCOUNTER — Ambulatory Visit
Admission: RE | Admit: 2022-08-29 | Discharge: 2022-08-29 | Disposition: A | Discharge status: Home / Self Care | Payer: 59 | Source: Ambulatory Visit | Attending: Urology | Admitting: Urology

## 2022-08-29 ENCOUNTER — Ambulatory Visit
Admission: RE | Admit: 2022-08-29 | Discharge: 2022-08-29 | Disposition: A | Discharge status: Home / Self Care | Payer: 59 | Attending: Urology | Admitting: Urology

## 2022-08-29 ENCOUNTER — Encounter: Payer: 59 | Admitting: Urology

## 2022-08-29 ENCOUNTER — Ambulatory Visit (INDEPENDENT_AMBULATORY_CARE_PROVIDER_SITE_OTHER): Payer: 59 | Admitting: Urology

## 2022-08-29 ENCOUNTER — Encounter: Payer: Self-pay | Admitting: Urology

## 2022-08-29 VITALS — BP 170/92 | HR 114 | Ht 68.0 in | Wt 230.0 lb

## 2022-08-29 DIAGNOSIS — N201 Calculus of ureter: Secondary | ICD-10-CM

## 2022-08-29 DIAGNOSIS — N200 Calculus of kidney: Secondary | ICD-10-CM | POA: Diagnosis not present

## 2022-08-29 DIAGNOSIS — R1031 Right lower quadrant pain: Secondary | ICD-10-CM

## 2022-08-29 LAB — URINALYSIS, COMPLETE
Bilirubin, UA: NEGATIVE
Glucose, UA: NEGATIVE
Leukocytes,UA: NEGATIVE
Nitrite, UA: NEGATIVE
RBC, UA: NEGATIVE
Specific Gravity, UA: 1.025 (ref 1.005–1.030)
Urobilinogen, Ur: 0.2 mg/dL (ref 0.2–1.0)
pH, UA: 5.5 (ref 5.0–7.5)

## 2022-08-29 LAB — MICROSCOPIC EXAMINATION

## 2022-08-29 MED ORDER — TADALAFIL 5 MG PO TABS: 5.0000 mg | ORAL_TABLET | Freq: Every day | ORAL | 0 refills | Status: DC | PRN

## 2022-08-29 MED ORDER — OXYCODONE HCL 5 MG PO CAPS: 5.0000 mg | ORAL_CAPSULE | ORAL | 0 refills | Status: DC | PRN

## 2022-09-03 LAB — CULTURE, URINE COMPREHENSIVE

## 2022-09-13 ENCOUNTER — Ambulatory Visit
Admission: RE | Admit: 2022-09-13 | Discharge: 2022-09-13 | Disposition: A | Discharge status: Home / Self Care | Payer: 59 | Source: Ambulatory Visit | Attending: Urology | Admitting: Urology

## 2022-09-13 DIAGNOSIS — R1031 Right lower quadrant pain: Secondary | ICD-10-CM | POA: Diagnosis present

## 2022-10-05 ENCOUNTER — Other Ambulatory Visit: Payer: Self-pay | Admitting: Urology

## 2022-10-05 DIAGNOSIS — R1031 Right lower quadrant pain: Secondary | ICD-10-CM

## 2022-11-17 ENCOUNTER — Other Ambulatory Visit: Payer: Self-pay | Admitting: Urology

## 2022-11-17 DIAGNOSIS — R1031 Right lower quadrant pain: Secondary | ICD-10-CM

## 2022-12-19 ENCOUNTER — Other Ambulatory Visit: Payer: Self-pay | Admitting: Family Medicine

## 2022-12-19 DIAGNOSIS — K219 Gastro-esophageal reflux disease without esophagitis: Secondary | ICD-10-CM

## 2022-12-19 NOTE — Telephone Encounter (Signed)
E-scribed refill.  Plz schedule CPE and fasting lab visits for additional refills. 

## 2022-12-20 NOTE — Telephone Encounter (Signed)
Noted  

## 2022-12-20 NOTE — Telephone Encounter (Signed)
Patient scheduled.

## 2023-02-21 ENCOUNTER — Other Ambulatory Visit (INDEPENDENT_AMBULATORY_CARE_PROVIDER_SITE_OTHER): Payer: 59

## 2023-02-21 DIAGNOSIS — E785 Hyperlipidemia, unspecified: Secondary | ICD-10-CM

## 2023-02-21 DIAGNOSIS — R7303 Prediabetes: Secondary | ICD-10-CM

## 2023-02-21 LAB — COMPREHENSIVE METABOLIC PANEL
ALT: 26 U/L (ref 0–53)
AST: 21 U/L (ref 0–37)
Albumin: 4.2 g/dL (ref 3.5–5.2)
Alkaline Phosphatase: 130 U/L — ABNORMAL HIGH (ref 39–117)
BUN: 13 mg/dL (ref 6–23)
CO2: 28 mEq/L (ref 19–32)
Calcium: 9.4 mg/dL (ref 8.4–10.5)
Chloride: 103 mEq/L (ref 96–112)
Creatinine, Ser: 0.74 mg/dL (ref 0.40–1.50)
GFR: 98.34 mL/min (ref >60.00)
Glucose, Bld: 95 mg/dL (ref 70–99)
Potassium: 4 mEq/L (ref 3.5–5.1)
Sodium: 140 mEq/L (ref 135–145)
Total Bilirubin: 0.7 mg/dL (ref 0.2–1.2)
Total Protein: 6.8 g/dL (ref 6.0–8.3)

## 2023-02-21 LAB — LIPID PANEL
Cholesterol: 135 mg/dL (ref 0–200)
HDL: 37.1 mg/dL — ABNORMAL LOW (ref >39.00)
LDL Cholesterol: 86 mg/dL (ref 0–99)
NonHDL: 97.49
Total CHOL/HDL Ratio: 4
Triglycerides: 57 mg/dL (ref 0.0–149.0)
VLDL: 11.4 mg/dL (ref 0.0–40.0)

## 2023-02-21 LAB — HEMOGLOBIN A1C: Hgb A1c MFr Bld: 5.9 % (ref 4.6–6.5)

## 2023-02-28 ENCOUNTER — Ambulatory Visit (INDEPENDENT_AMBULATORY_CARE_PROVIDER_SITE_OTHER): Payer: 59 | Admitting: Family Medicine

## 2023-02-28 ENCOUNTER — Encounter: Payer: Self-pay | Admitting: Family Medicine

## 2023-02-28 VITALS — BP 152/92 | HR 65 | Temp 98.0°F | Ht 68.0 in | Wt 241.2 lb

## 2023-02-28 DIAGNOSIS — R7303 Prediabetes: Secondary | ICD-10-CM

## 2023-02-28 DIAGNOSIS — K219 Gastro-esophageal reflux disease without esophagitis: Secondary | ICD-10-CM

## 2023-02-28 DIAGNOSIS — G8929 Other chronic pain: Secondary | ICD-10-CM

## 2023-02-28 DIAGNOSIS — E785 Hyperlipidemia, unspecified: Secondary | ICD-10-CM | POA: Diagnosis not present

## 2023-02-28 DIAGNOSIS — I2510 Atherosclerotic heart disease of native coronary artery without angina pectoris: Secondary | ICD-10-CM

## 2023-02-28 DIAGNOSIS — R1031 Right lower quadrant pain: Secondary | ICD-10-CM

## 2023-02-28 DIAGNOSIS — Z0001 Encounter for general adult medical examination with abnormal findings: Secondary | ICD-10-CM

## 2023-02-28 DIAGNOSIS — I10 Essential (primary) hypertension: Secondary | ICD-10-CM

## 2023-02-28 DIAGNOSIS — K760 Fatty (change of) liver, not elsewhere classified: Secondary | ICD-10-CM

## 2023-02-28 MED ORDER — LOSARTAN POTASSIUM 50 MG PO TABS: ORAL_TABLET | ORAL | 4 refills | Status: DC

## 2023-02-28 MED ORDER — OMEPRAZOLE 20 MG PO CPDR: 20.0000 mg | DELAYED_RELEASE_CAPSULE | Freq: Every day | ORAL | 4 refills | Status: DC

## 2023-02-28 NOTE — Assessment & Plan Note (Signed)
Discussed weight gain noted. Encouraged healthy diet and lifestyle choices to affect sustainable weight loss.  Obesity complicated by comorbidities of hypertension, gerd, hyperlipidemia, osteoarthritis.

## 2023-02-28 NOTE — Patient Instructions (Addendum)
I'd like to refer you back to physical therapy at [facility] for ongoing R hip/inguinal pain. Let me know how you do with this, if ongoing, return to ortho and/or urology for evaluation.  Increase losartan to50mg  daily - new dose sent to pharmacy  Good to see you today Return as needed or in 1 year for next physical

## 2023-02-28 NOTE — Assessment & Plan Note (Addendum)
Reviewed A1c, recommend limiting added sugar in diet.

## 2023-02-28 NOTE — Assessment & Plan Note (Addendum)
H/o this by RUQ [place] 2021.  ALP mildly elevated - likely due to this.  Encouraged good sugar and cholesterol control, weight loss to help manage this.

## 2023-02-28 NOTE — Assessment & Plan Note (Signed)
Stable period on once daily omeprazole 20mg  

## 2023-02-28 NOTE — Assessment & Plan Note (Signed)
Chronic, stable on high potency atorvastatin - continue. This is filled by cardiology  The 10-year ASCVD risk score (Arnett DK, et al., 2019) is: 11.7%   Values used to calculate the score:     Age: 62 years     Sex: Male     Is Non-Hispanic African American: No     Diabetic: No     Tobacco smoker: No     Systolic Blood Pressure: 152 mmHg     Is BP treated: Yes     HDL Cholesterol: 37.1 mg/dL     Total Cholesterol: 135 mg/dL

## 2023-02-28 NOTE — Assessment & Plan Note (Signed)
Chronic, deteriorated - increase losartan to 50mg  daily.  Discussed caffeine intake.

## 2023-02-28 NOTE — Progress Notes (Signed)
Ph: [phone] Fax: [phone]   Patient ID: [name] [name], male    DOB: [birth year], 62 y.o.   MRN: [id number]  This visit was conducted in person.  BP (!) 152/92 (BP Location: Right Arm, Cuff Size: Large)   Pulse 65   Temp 98 F (36.7 C) (Temporal)   Ht 5\' 8"  (1.727 m)   Wt 241 lb 4 oz (109.4 kg)   SpO2 98%   BMI 36.68 kg/m   BP Readings from Last 3 Encounters:  02/28/23 (!) 152/92  08/29/22 (!) 170/92  07/22/22 (!) 134/90    CC: CPE Subjective:   HPI: [name] [name] is a 62 y.o. male presenting on 02/28/2023 for Annual Exam   HTN - Compliant with current antihypertensive regimen of losartan 25mg  daily. May have missed some doses ie yesterday's. Drinks 4 cups of caffeinated beverage daily. Does not check blood pressures at home. Denies HA, vision changes, CP/tightness, SOB, leg swelling.   Sleep initiation insomnia - trazodone didn't help, melatonin ineffective. Mixed drink helps.   H/o R knee  replacement Charlann Boxer) 02/2022 followed by R hip replacement 07/2022.  Notes ongoing constant R groin pain - wonders if due to scrotal varicocele. Hip replacement didn't resolve this pain. No inguinal bulge. No pain with straining, no recent heavy lifting. Worse with walking. Pain present since 06/2020. CT scan in ER was unrevealing. R groin [place] was unrevealing as well.   CAD followed by Dr Juliann Pares - on lipitor. Not on aspirin.   Preventative: COLONOSCOPY Date: 2012 WNL with benign polyps per PCP report (from [place]).       COLONOSCOPY 10/06/2021 done for rectal bleed after polypectomy s/p clip, rec rpt colonoscopy 6 months to remove polyp seen ascending colon ([facility])  COLONOSCOPY WITH PROPOFOL N/A 10/04/2021 TAx2, low grade dysplasia present, hemorrhoids, diverticulosis, rpt 3 yrs Allegra Lai, Loel Dubonnet, MD)  COLONOSCOPY WITH PROPOFOL N/A 10/06/2021 Procedure: COLONOSCOPY WITH PROPOFOL;  Surgeon: Regis Bill, MD;  Location: [facility];  Service: Endoscopy;  Laterality:  N/A;  COLONOSCOPY WITH PROPOFOL N/A 07/22/2022 TAs, int hem, rpt 3 yrs Servando Snare, Darren, MD)   Prostate screening - discussed, declines screening. Nocturia x1.  Lung cancer screening - not eligible Flu shot - declines Covid vaccine - J&J 06/2020, J&J booster x1 Td 01/2018 Shingrix - declines at this time Seat belt use discussed Sunscreen use discussed. No changing moles on skin. 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 Interim medical history since our last visit reviewed. Allergies and medications reviewed and updated. Outpatient Medications Prior to Visit  Medication Sig Dispense Refill   atorvastatin (LIPITOR) 80 MG tablet Take 1 tablet (80 mg total) by mouth daily.     losartan (COZAAR) 25 MG tablet TAKE 1 TABLET(25 MG) BY MOUTH DAILY 90 tablet 0   omeprazole (PRILOSEC) 20 MG capsule TAKE 1 CAPSULE(20 MG) BY MOUTH DAILY 90 capsule 0   oxycodone (OXY-IR) 5 MG capsule Take 1 capsule (5 mg total) by mouth every 4 (four) hours as needed. 10 capsule 0   tadalafil (CIALIS) 5 MG tablet TAKE 1 TABLET(5 MG) BY MOUTH DAILY  AS NEEDED FOR ERECTILE DYSFUNCTION 30 tablet 0   traZODone (DESYREL) 50 MG tablet Take 0.5-1 tablets (25-50 mg total) by mouth at bedtime as needed for sleep. 30 tablet 3   No facility-administered medications prior to visit.     Per HPI unless specifically indicated in ROS section below Review of Systems  Constitutional:  Negative for activity change, appetite change, chills, fatigue, fever and unexpected weight change.  HENT:  Negative for hearing loss.   Eyes:  Negative for visual disturbance.  Respiratory:  Negative for  cough, chest tightness, shortness of breath and wheezing.   Cardiovascular:  Negative for chest pain, palpitations and leg swelling.  Gastrointestinal:  Positive for abdominal pain (R groin). Negative for abdominal distention, blood in stool, constipation, diarrhea, nausea and vomiting.  Genitourinary:  Negative for difficulty urinating and hematuria.  Musculoskeletal:  Negative for arthralgias, myalgias and neck pain.  Skin:  Negative for rash.  Neurological:  Negative for dizziness, seizures, syncope and headaches.  Hematological:  Negative for adenopathy. Does not bruise/bleed easily.  Psychiatric/Behavioral:  Negative for dysphoric mood. The patient is not nervous/anxious.     Objective:  BP (!) 152/92 (BP Location: Right Arm, Cuff Size: Large)   Pulse 65   Temp 98 F (36.7 C) (Temporal)   Ht 5\' 8"  (1.727 m)   Wt 241 lb 4 oz (109.4 kg)   SpO2 98%   BMI 36.68 kg/m   Wt Readings from Last 3 Encounters:  02/28/23 241 lb 4 oz (109.4 kg)  08/29/22 230 lb (104.3 kg)  07/22/22 232 lb (105.2 kg)      Physical Exam Vitals and nursing note reviewed.  Constitutional:      General: He is not in acute distress.    Appearance: Normal appearance. He is well-developed. He is not ill-appearing.  HENT:     Head: Normocephalic and atraumatic.     Right Ear: Hearing, tympanic membrane, ear canal and external ear normal.     Left Ear: Hearing, tympanic membrane, ear canal and external ear normal.     Mouth/Throat:     Mouth: Mucous membranes are moist.     Pharynx: Oropharynx is clear. No oropharyngeal exudate or posterior oropharyngeal erythema.  Eyes:     General: No scleral icterus.    Extraocular Movements: Extraocular movements intact.     Conjunctiva/sclera: Conjunctivae normal.     Pupils: Pupils are equal, round, and reactive to light.  Neck:     Thyroid: No thyroid mass or thyromegaly.     Vascular: No carotid bruit.  Cardiovascular:     Rate and Rhythm: Normal rate and  regular rhythm.     Pulses: Normal pulses.          Radial pulses are 2+ on the right side and 2+ on the left side.     Heart sounds: Normal heart sounds. No murmur heard. Pulmonary:     Effort: Pulmonary effort is normal. No respiratory distress.     Breath sounds: Normal breath sounds. No wheezing, rhonchi or rales.  Abdominal:     General: Bowel sounds are normal. There is no distension.     Palpations: Abdomen is soft. There is no mass.     Tenderness: There is no abdominal tenderness. There is no guarding or rebound.     Hernia: No hernia is present. There is no hernia in the left inguinal area or right inguinal area.  Genitourinary:    Penis: Normal and circumcised.  Testes: Normal.        Right: Mass, tenderness or swelling not present. Right testis is descended.        Left: Mass, tenderness or swelling not present. Left testis is descended.     Epididymis:     Right: Normal.     Left: Normal.       Comments:  Tender at inferior superficial inguinal canal on right, pain not reproducible No obvious hydrocele, varicocele, or scrotal mass appreciated Musculoskeletal:        General: Normal range of motion.     Cervical back: Normal range of motion and neck supple.     Right lower leg: No edema.     Left lower leg: No edema.     Comments:  Tender with internal rotation at R groin Tender with hip flexion against resistance   Lymphadenopathy:     Cervical: No cervical adenopathy.     Lower Body: No right inguinal adenopathy. No left inguinal adenopathy.  Skin:    General: Skin is warm and dry.     Findings: No rash.  Neurological:     General: No focal deficit present.     Mental Status: He is alert and oriented to person, place, and time.  Psychiatric:        Mood and Affect: Mood normal.        Behavior: Behavior normal.        Thought Content: Thought content normal.        Judgment: Judgment normal.       Results for orders placed or performed in visit on  02/21/23  Hemoglobin A1c  Result Value Ref Range   Hgb A1c MFr Bld 5.9 4.6 - 6.5 %  Comprehensive metabolic panel  Result Value Ref Range   Sodium 140 135 - 145 mEq/L   Potassium 4.0 3.5 - 5.1 mEq/L   Chloride 103 96 - 112 mEq/L   CO2 28 19 - 32 mEq/L   Glucose, Bld 95 70 - 99 mg/dL   BUN 13 6 - 23 mg/dL   Creatinine, Ser 6.29 0.40 - 1.50 mg/dL   Total Bilirubin 0.7 0.2 - 1.2 mg/dL   Alkaline Phosphatase 130 (H) 39 - 117 U/L   AST 21 0 - 37 U/L   ALT 26 0 - 53 U/L   Total Protein 6.8 6.0 - 8.3 g/dL   Albumin 4.2 3.5 - 5.2 g/dL   GFR 52.84 >13.24 mL/min   Calcium 9.4 8.4 - 10.5 mg/dL  Lipid panel  Result Value Ref Range   Cholesterol 135 0 - 200 mg/dL   Triglycerides 40.1 0.0 - 149.0 mg/dL   HDL 02.72 (L) >53.66 mg/dL   VLDL 44.0 0.0 - 34.7 mg/dL   LDL Cholesterol 86 0 - 99 mg/dL   Total CHOL/HDL Ratio 4    NonHDL 97.49    Lab Results  Component Value Date   PSA 0.21 08/07/2021   PSA 0.2 10/08/2019   PSA 0.25 02/13/2018   Assessment & Plan:   Problem List Items Addressed This Visit     Encounter for general adult medical examination with abnormal findings - Primary (Chronic)    Preventative protocols reviewed and updated unless pt declined. Discussed healthy diet and lifestyle.       GERD (gastroesophageal reflux disease)    Stable period on once daily omeprazole 20mg       Relevant Medications   omeprazole (PRILOSEC) 20 MG capsule   CAD (coronary artery disease)  Seeing cardiology ([facility]) on atorvastatin. Not on aspirin.      Relevant Medications   losartan (COZAAR) 50 MG tablet   Dyslipidemia    Chronic, stable on high potency atorvastatin - continue. This is filled by cardiology  The 10-year ASCVD risk score (Arnett DK, et al., 2019) is: 11.7%   Values used to calculate the score:     Age: 5 years     Sex: Male     Is Non-Hispanic African American: No     Diabetic: No     Tobacco smoker: No     Systolic Blood Pressure: 152 mmHg     Is BP  treated: Yes     HDL Cholesterol: 37.1 mg/dL     Total Cholesterol: 135 mg/dL       Hypertension, essential    Chronic, deteriorated - increase losartan to 50mg  daily.  Discussed caffeine intake.       Relevant Medications   losartan (COZAAR) 50 MG tablet   Severe obesity (BMI 35.0-39.9) with comorbidity (HCC)    Discussed weight gain noted. Encouraged healthy diet and lifestyle choices to affect sustainable weight loss.  Obesity complicated by comorbidities of hypertension, gerd, hyperlipidemia, osteoarthritis.       Steatosis of liver    H/o this by RUQ [place] 2021.  ALP mildly elevated - likely due to this.  Encouraged good sugar and cholesterol control, weight loss to help manage this.       Chronic pain of right groin    R groin pain present since ~06/2020. Has had reassuring imaging including CT abd/pelvis 2021, R groin ultrasound 2021, has had general surgery evaluation, has had ortho evaluation s/p R hip replacement without significant improvement in symptoms.  Exam today suspicious for pain along inguinal canal, ongoing R hip pain.  Will refer to [facility] physical therapy.  If no better, consider lumbar imaging with PM&R eval r/o referred lumbar radiculopathy vs advanced hip imaging, uro/ortho re-evaluation.       Relevant Orders   Ambulatory referral to Physical Therapy   Prediabetes    Reviewed A1c, recommend limiting added sugar in diet.         Meds ordered this encounter  Medications   losartan (COZAAR) 50 MG tablet    Sig: Take 1 tablet 50mg  by mouth daily for blood pressure    Dispense:  90 tablet    Refill:  4   omeprazole (PRILOSEC) 20 MG capsule    Sig: Take 1 capsule (20 mg total) by mouth daily.    Dispense:  90 capsule    Refill:  4    Orders Placed This Encounter  Procedures   Ambulatory referral to Physical Therapy    Referral Priority:   Routine    Referral Type:   Physical Medicine    Referral Reason:   Specialty Services Required    Requested  Specialty:   Physical Therapy    Number of Visits Requested:   1    Patient Instructions  I'd like to refer you back to physical therapy at [facility] for ongoing R hip/inguinal pain. Let me know how you do with this, if ongoing, return to ortho and/or urology for evaluation.  Increase losartan to50mg  daily - new dose sent to pharmacy  Good to see you today Return as needed or in 1 year for next physical   Follow up plan: Return in about 1 year (around 02/28/2024) for annual exam, prior fasting for blood work.  Eustaquio Boyden, MD

## 2023-02-28 NOTE — Assessment & Plan Note (Addendum)
R groin pain present since ~06/2020. Has had reassuring imaging including CT abd/pelvis 2021, R groin ultrasound 2021, has had general surgery evaluation, has had ortho evaluation s/p R hip replacement without significant improvement in symptoms.  Exam today suspicious for pain along inguinal canal, ongoing R hip pain.  Will refer to [facility] physical therapy.  If no better, consider lumbar imaging with PM&R eval r/o referred lumbar radiculopathy vs advanced hip imaging, uro/ortho re-evaluation.

## 2023-02-28 NOTE — Assessment & Plan Note (Addendum)
Seeing cardiology ([facility]) on atorvastatin. Not on aspirin.

## 2023-02-28 NOTE — Assessment & Plan Note (Signed)
Preventative protocols reviewed and updated unless pt declined. Discussed healthy diet and lifestyle.  

## 2023-03-03 ENCOUNTER — Other Ambulatory Visit: Payer: Self-pay

## 2023-03-03 ENCOUNTER — Emergency Department: Payer: 59

## 2023-03-03 ENCOUNTER — Emergency Department
Admission: EM | Admit: 2023-03-03 | Discharge: 2023-03-03 | Disposition: A | Discharge status: Home / Self Care | Payer: 59 | Attending: Emergency Medicine | Admitting: Emergency Medicine

## 2023-03-03 DIAGNOSIS — N200 Calculus of kidney: Secondary | ICD-10-CM | POA: Insufficient documentation

## 2023-03-03 DIAGNOSIS — R1030 Lower abdominal pain, unspecified: Secondary | ICD-10-CM | POA: Diagnosis present

## 2023-03-03 LAB — CBC
HCT: 42.3 % (ref 39.0–52.0)
Hemoglobin: 14.2 g/dL (ref 13.0–17.0)
MCH: 30.5 pg (ref 26.0–34.0)
MCHC: 33.6 g/dL (ref 30.0–36.0)
MCV: 90.8 fL (ref 80.0–100.0)
Platelets: 215 10*3/uL (ref 150–400)
RBC: 4.66 MIL/uL (ref 4.22–5.81)
RDW: 14.2 % (ref 11.5–15.5)
WBC: 8.3 10*3/uL (ref 4.0–10.5)
nRBC: 0 % (ref 0.0–0.2)

## 2023-03-03 LAB — URINALYSIS, ROUTINE W REFLEX MICROSCOPIC
Bilirubin Urine: NEGATIVE
Glucose, UA: NEGATIVE mg/dL
Ketones, ur: 5 mg/dL — AB
Leukocytes,Ua: NEGATIVE
Nitrite: NEGATIVE
Protein, ur: 100 mg/dL — AB
RBC / HPF: >50 RBC/hpf (ref 0–5)
Specific Gravity, Urine: 1.028 (ref 1.005–1.030)
Squamous Epithelial / HPF: NONE SEEN /HPF (ref 0–5)
pH: 5 (ref 5.0–8.0)

## 2023-03-03 LAB — BASIC METABOLIC PANEL
Anion gap: 11 (ref 5–15)
BUN: 11 mg/dL (ref 6–20)
CO2: 24 mmol/L (ref 22–32)
Calcium: 9.3 mg/dL (ref 8.9–10.3)
Chloride: 104 mmol/L (ref 98–111)
Creatinine, Ser: 0.8 mg/dL (ref 0.61–1.24)
GFR, Estimated: >60 mL/min (ref >60)
Glucose, Bld: 123 mg/dL — ABNORMAL HIGH (ref 70–99)
Potassium: 3.7 mmol/L (ref 3.5–5.1)
Sodium: 139 mmol/L (ref 135–145)

## 2023-03-03 MED ORDER — TAMSULOSIN HCL 0.4 MG PO CAPS: 0.4000 mg | ORAL_CAPSULE | Freq: Every day | ORAL | 0 refills | Status: DC

## 2023-03-03 MED ORDER — OXYCODONE-ACETAMINOPHEN 5-325 MG PO TABS
1.0000 | ORAL_TABLET | Freq: Once | ORAL | Status: AC
  Administered 2023-03-03: 1 via ORAL
  Filled 2023-03-03: qty 1

## 2023-03-03 MED ORDER — OXYCODONE-ACETAMINOPHEN 5-325 MG PO TABS: 1.0000 | ORAL_TABLET | Freq: Four times a day (QID) | ORAL | 0 refills | Status: DC | PRN

## 2023-03-03 MED ORDER — TAMSULOSIN HCL 0.4 MG PO CAPS
0.4000 mg | ORAL_CAPSULE | Freq: Once | ORAL | Status: AC
  Administered 2023-03-03: 0.4 mg via ORAL
  Filled 2023-03-03: qty 1

## 2023-03-03 MED ORDER — KETOROLAC TROMETHAMINE 30 MG/ML IJ SOLN
30.0000 mg | Freq: Once | INTRAMUSCULAR | Status: AC
  Administered 2023-03-03: 30 mg via INTRAMUSCULAR
  Filled 2023-03-03: qty 1

## 2023-03-03 MED ORDER — KETOROLAC TROMETHAMINE 10 MG PO TABS: 10.0000 mg | ORAL_TABLET | Freq: Four times a day (QID) | ORAL | 0 refills | Status: DC | PRN

## 2023-03-03 NOTE — ED Provider Triage Note (Signed)
Emergency Medicine Provider Triage Evaluation Note  [name] [name], a 62 y.o. male  was evaluated in triage.  Pt complains of flank pain and gross hematuria.  He presents to the ED for evaluation.  He does endorse a history of kidney stones.  Review of Systems  Positive: Right flank pain, hematuria Negative: FCS  Physical Exam  BP (!) 192/95   Pulse 69   Temp 98 F (36.7 C)   Resp 19   SpO2 95%  Gen:   Awake, no distress  NAD Resp:  Normal effort CTA MSK:   Moves extremities without difficulty  Other:    Medical Decision Making  Medically screening exam initiated at 5:33 PM.  Appropriate orders placed.  [name] [name] was informed that the remainder of the evaluation will be completed by another provider, this initial triage assessment does not replace that evaluation, and the importance of remaining in the ED until their evaluation is complete.  Patient to the ED for evaluation of flank pain and hematuria with a history of kidney stones.    Lissa Hoard, PA-C 03/03/23 1733

## 2023-03-03 NOTE — ED Triage Notes (Signed)
Pt comes with c/o right side flank pain that started today. Pt states hx of kidney stones. Pt states blood in urine.

## 2023-03-03 NOTE — ED Notes (Signed)
See triage notes. Patient has a hx of kidney stones. Stated he started feeling like he had one today.

## 2023-03-03 NOTE — ED Provider Notes (Signed)
[facility] Provider Note  Patient Contact: 8:08 PM (approximate)   History   Flank Pain   HPI  [name] [name] is a 62 y.o. male who presents the emergency department complaining of right flank and right groin pain.  Patient does have a history of kidney stones with similar symptoms.  He does have some hematuria, no dysuria.  Denies nausea, vomiting, diarrhea or constipation.     Physical Exam   Triage Vital Signs: ED Triage Vitals  Enc Vitals Group     BP 03/03/23 1725 (!) 192/95     Pulse Rate 03/03/23 1725 69     Resp 03/03/23 1725 19     Temp 03/03/23 1725 98 F (36.7 C)     Temp src --      SpO2 03/03/23 1725 95 %     Weight --      Height --      Head Circumference --      Peak Flow --      Pain Score 03/03/23 1724 10     Pain Loc --      Pain Edu? --      Excl. in GC? --     Most recent vital signs: Vitals:   03/03/23 1725 03/03/23 1925  BP: (!) 192/95 (!) 192/95  Pulse: 69 63  Resp: 19 20  Temp: 98 F (36.7 C)   SpO2: 95% 98%     General: Alert and in no acute distress.  Cardiovascular:  Good peripheral perfusion Respiratory: Normal respiratory effort without tachypnea or retractions. Lungs CTAB.  Gastrointestinal: Bowel sounds 4 quadrants. Soft and nontender to palpation. No guarding or rigidity. No palpable masses. No distention. No CVA tenderness. Musculoskeletal: Full range of motion to all extremities.  Neurologic:  No gross focal neurologic deficits are appreciated.  Skin:   No rash noted Other:   ED Results / Procedures / Treatments   Labs (all labs ordered are listed, but only abnormal results are displayed) Labs Reviewed  URINALYSIS, ROUTINE W REFLEX MICROSCOPIC - Abnormal; Notable for the following components:      Result Value   Color, Urine AMBER (*)    APPearance CLOUDY (*)    Hgb urine dipstick LARGE (*)    Ketones, ur 5 (*)    Protein, ur 100 (*)    Bacteria, UA RARE (*)    All other components  within normal limits  BASIC METABOLIC PANEL - Abnormal; Notable for the following components:   Glucose, Bld 123 (*)    All other components within normal limits  CBC     EKG     RADIOLOGY  I personally viewed, evaluated, and interpreted these images as part of my medical decision making, as well as reviewing the written report by the radiologist.  ED Provider Interpretation: 4 mm stone noted in the proximal right ureter which does not have evidence of hydroureter or hydronephrosis.  CT Renal Stone Study  Result Date: 03/03/2023 CLINICAL DATA:  Acute right flank pain. EXAM: CT ABDOMEN AND PELVIS WITHOUT CONTRAST TECHNIQUE: Multidetector CT imaging of the abdomen and pelvis was performed following the standard protocol without IV contrast. RADIATION DOSE REDUCTION: This exam was performed according to the departmental dose-optimization program which includes automated exposure control, adjustment of the mA and/or kV according to patient size and/or use of iterative reconstruction technique. COMPARISON:  September 13, 2022. FINDINGS: Lower chest: No acute abnormality. Hepatobiliary: No focal liver abnormality is seen. No gallstones, gallbladder  wall thickening, or biliary dilatation. Pancreas: Unremarkable. No pancreatic ductal dilatation or surrounding inflammatory changes. Spleen: Normal in size without focal abnormality. Adrenals/Urinary Tract: Adrenal glands appear normal. No nephrolithiasis is noted. No definite hydronephrosis is noted. However, 4 mm calculus is noted in proximal right ureter which does not result in significant ureteral dilatation. Urinary bladder is decompressed. Stomach/Bowel: Stomach is within normal limits. Appendix appears normal. No evidence of bowel wall thickening, distention, or inflammatory changes. Diverticulosis of descending and sigmoid colon is noted without inflammation. Vascular/Lymphatic: Aortic atherosclerosis. No enlarged abdominal or pelvic lymph nodes.  Reproductive: Prostate is unremarkable. Other: No abdominal wall hernia or abnormality. No abdominopelvic ascites. Musculoskeletal: Status post right total hip arthroplasty. No acute osseous abnormality is noted. IMPRESSION: 4 mm calculus noted in proximal right ureter which does not result in significant ureteral dilatation or hydronephrosis. Diverticulosis of descending and sigmoid colon without inflammation. Aortic Atherosclerosis (ICD10-I70.0). Electronically Signed   By: Lupita Raider M.D.   On: 03/03/2023 18:05    PROCEDURES:  Critical Care performed: No  Procedures   MEDICATIONS ORDERED IN ED: Medications  ketorolac (TORADOL) 30 MG/ML injection 30 mg (has no administration in time range)  tamsulosin (FLOMAX) capsule 0.4 mg (has no administration in time range)  oxyCODONE-acetaminophen (PERCOCET/ROXICET) 5-325 MG per tablet 1 tablet (has no administration in time range)     IMPRESSION / MDM / ASSESSMENT AND PLAN / ED COURSE  I reviewed the triage vital signs and the nursing notes.                                 Differential diagnosis includes, but is not limited to, nephrolithiasis, pyelonephritis, UTI   [name] presentation is most consistent with acute presentation with potential threat to life or bodily function.   [name] diagnosis is consistent with nephrolithiasis.  Patient presents emergency department with hematuria and flank into right groin pain concerning for possible kidney stone.  History of same.  [name] labs are reassuring.  Does have findings on CT consistent with kidney stone.  Will treat with pain medication, Flomax and anti-inflammatory.  Follow-up with urology.. Patient is given ED precautions to return to the ED for any worsening or new symptoms.     FINAL CLINICAL IMPRESSION(S) / ED DIAGNOSES   Final diagnoses:  Nephrolithiasis     Rx / DC Orders   ED Discharge Orders          Ordered    ketorolac (TORADOL) 10 MG tablet  Every 6  hours PRN        03/03/23 2008    oxyCODONE-acetaminophen (PERCOCET/ROXICET) 5-325 MG tablet  Every 6 hours PRN        03/03/23 2008    tamsulosin (FLOMAX) 0.4 MG CAPS capsule  Daily        03/03/23 2008             Note:  This document was prepared using Dragon voice recognition software and may include unintentional dictation errors.   Lanette Hampshire 03/03/23 2010    Jene Every, MD 03/03/23 2115

## 2023-03-04 ENCOUNTER — Telehealth: Payer: Self-pay

## 2023-03-04 NOTE — Transitions of Care (Post Inpatient/ED Visit) (Signed)
   03/04/2023  Name: [name] [name] MRN: [id number] DOB: March 11, 1961  Today's TOC FU Call Status: Today's TOC FU Call Status:: Successful TOC FU Call Competed TOC FU Call Complete Date: 03/04/23  Transition Care Management Follow-up Telephone Call Date of Discharge: 03/03/23 Discharge Facility: [facility]) Type of Discharge: Emergency Department Reason for ED Visit: Other: (Nephrolithiasis) How have you been since you were released from the hospital?: Better Any questions or concerns?: No  Items Reviewed: Did you receive and understand the discharge instructions provided?: Yes Medications obtained,verified, and reconciled?: Yes (Medications Reviewed) Any new allergies since your discharge?: No Dietary orders reviewed?: Yes Do you have support at home?: No  Medications Reviewed Today: Medications Reviewed Today     Reviewed by Merleen Nicely, LPN (Licensed Practical Nurse) on 03/04/23 at 1035  Med List Status: <None>   Medication Order Taking? Sig Documenting Provider Last Dose Status Informant  atorvastatin (LIPITOR) 80 MG tablet [id number] Yes Take 1 tablet (80 mg total) by mouth daily. Eustaquio Boyden, MD Taking Active   ketorolac (TORADOL) 10 MG tablet [id number] Yes Take 1 tablet (10 mg total) by mouth every 6 (six) hours as needed. Cuthriell, Delorise Royals, PA-C Taking Active   losartan (COZAAR) 50 MG tablet [id number] Yes Take 1 tablet 50mg  by mouth daily for blood pressure Eustaquio Boyden, MD Taking Active   omeprazole (PRILOSEC) 20 MG capsule [id number] Yes Take 1 capsule (20 mg total) by mouth daily. Eustaquio Boyden, MD Taking Active   oxyCODONE-acetaminophen (PERCOCET/ROXICET) 5-325 MG tablet [id number] Yes Take 1 tablet by mouth every 6 (six) hours as needed for severe pain. Cuthriell, Delorise Royals, PA-C Taking Active   tamsulosin (FLOMAX) 0.4 MG CAPS capsule [id number] Yes Take 1 capsule (0.4 mg total) by mouth daily for 14 days. Cuthriell, Delorise Royals, PA-C Taking Active             Home Care and Equipment/Supplies: Were Home Health Services Ordered?: NA Any new equipment or medical supplies ordered?: NA  Functional Questionnaire: Do you need assistance with bathing/showering or dressing?: No Do you need assistance with meal preparation?: No Do you need assistance with eating?: No Do you have difficulty maintaining continence: No Do you need assistance with getting out of bed/getting out of a chair/moving?: No Do you have difficulty managing or taking your medications?: No  Follow up appointments reviewed: PCP Follow-up appointment confirmed?: No MD Provider Line Number:[phone] Given: Yes Specialist Hospital Follow-up appointment confirmed?: Yes Date of Specialist follow-up appointment?: 03/05/23 Follow-Up Specialty Provider:: Dt Stoioff Do you need transportation to your follow-up appointment?: No Do you understand care options if your condition(s) worsen?: Yes-patient verbalized understanding    SIGNATURE  Woodfin Ganja LPN [facility] Nurse Health Advisor Direct Dial [phone]

## 2023-03-05 ENCOUNTER — Ambulatory Visit
Admission: RE | Admit: 2023-03-05 | Discharge: 2023-03-05 | Disposition: A | Discharge status: Home / Self Care | Payer: 59 | Source: Ambulatory Visit | Attending: Urology | Admitting: Urology

## 2023-03-05 ENCOUNTER — Encounter: Payer: Self-pay | Admitting: Urology

## 2023-03-05 ENCOUNTER — Ambulatory Visit (INDEPENDENT_AMBULATORY_CARE_PROVIDER_SITE_OTHER): Payer: 59 | Admitting: Urology

## 2023-03-05 VITALS — BP 165/99 | HR 84 | Wt 250.0 lb

## 2023-03-05 DIAGNOSIS — N201 Calculus of ureter: Secondary | ICD-10-CM

## 2023-03-05 DIAGNOSIS — N23 Unspecified renal colic: Secondary | ICD-10-CM

## 2023-03-05 LAB — URINALYSIS, COMPLETE
Bilirubin, UA: NEGATIVE
Glucose, UA: NEGATIVE
Ketones, UA: NEGATIVE
Leukocytes,UA: NEGATIVE
Nitrite, UA: NEGATIVE
Specific Gravity, UA: 1.025 (ref 1.005–1.030)
Urobilinogen, Ur: 0.2 mg/dL (ref 0.2–1.0)
pH, UA: 5.5 (ref 5.0–7.5)

## 2023-03-05 LAB — MICROSCOPIC EXAMINATION

## 2023-03-05 NOTE — Progress Notes (Signed)
I, [name], acting as a scribe for [name] Altes, MD., have documented all relevant documentation on the behalf of [name] Altes, MD, as directed by  [name] Altes, MD while in the presence of [name] Altes, MD.  03/05/2023 10:57 AM   [name] [name] Jun 09, 1961 [id number]  Referring provider: Eustaquio Boyden, MD [address] [place],  [place] [address]  Chief Complaint  Patient presents with   Nephrolithiasis   Urological history: 1. Nephrolithiasis -stone composition unknown -right URS (05/03/2022)    2. BPH with LU TS -PSA (07/2021) 0.21 -cysto (04/2022) - small prostate w/ small median lobe    HPI: [name] [name] is a 62 y.o. male presenting for evaluation of renal colic.  ED visit 03/03/23 for onset of right flank pain radiating to the right groin region, similar to previous episodes of renal colic. No fever, chills, nausea, or vomiting. Evaluation in the ED remarkable for urinalysis, which showed >50 RBCs. CT renal stone study was performed which was remarkable for a 4 mm right proximal ureteral calculus without significant hydronephrosis/hydroureter. Pain was managed with Ketorolac and he was discharged on Tamsulosin, Oxycodone, and oral Ketorolac. Since his ED visit, his pain has been managed with oral analgesics.    PMH: Past Medical History:  Diagnosis Date   CAD (coronary artery disease)    mild   Dyslipidemia    GERD (gastroesophageal reflux disease)    with esophagitis   History of Helicobacter pylori infection    Hypertension, essential    Obesity, Class I, BMI 30-34.9 01/19/2016    Surgical History: Past Surgical History:  Procedure Laterality Date   ANTERIOR CERVICAL DECOMP/DISCECTOMY FUSION  2012   C4-5 ([place])   ANTERIOR CERVICAL DECOMP/DISCECTOMY FUSION N/A 03/12/2018   ACDF C4-6, EXPLORATION OF FUSION C6-7, REMOVAL OF HARDWARE;  Shon Baton, [name], MD)   ARTERY BIOPSY Left 10/20/2019   TEMPORAL ARTERY BIOPSY - Gilda Crease,  Latina Craver, MD   CARDIAC CATHETERIZATION  2013   no significant CAD, nl LVEF 60% [place] Flakes)   COLONOSCOPY  2012   WNL per PCP report   COLONOSCOPY  10/06/2021   done for rectal bleed after polypectomy s/p clip, rec rpt colonoscopy 6 months to remove polyp seen ascending colon ([place])   COLONOSCOPY WITH PROPOFOL N/A 10/04/2021   TAx2, low grade dysplasia present, hemorrhoids, diverticulosis, rpt 3 yrs (Vanga, Loel Dubonnet, MD)   COLONOSCOPY WITH PROPOFOL N/A 10/06/2021   Procedure: COLONOSCOPY WITH PROPOFOL;  Surgeon: Regis Bill, MD;  Location: [facility];  Service: Endoscopy;  Laterality: N/A;   COLONOSCOPY WITH PROPOFOL N/A 07/22/2022   TAs, int hem, rpt 3 yrs Servando Snare, Darren, MD)   CT CTA CORONARY W/CA SCORE W/CM &/OR WO/CM  2015   minor luminal irregularities, Ca score = 90 [place] Flakes)   CYSTOSCOPY WITH URETEROSCOPY, STONE BASKETRY AND STENT PLACEMENT Right 05/03/2022   Procedure: CYSTOSCOPY WITH URETEROSCOPY, STONE BASKETRY AND STENT PLACEMENT;  Surgeon: Sondra Come, MD;  Location: [facility];  Service: Urology;  Laterality: Right;   FLEXIBLE SIGMOIDOSCOPY N/A 10/05/2021   Procedure: FLEXIBLE SIGMOIDOSCOPY;  Surgeon: Toney Reil, MD;  Location: [facility];  Service: Gastroenterology;  Laterality: N/A;   HOLMIUM LASER APPLICATION Right 05/03/2022   Procedure: HOLMIUM LASER APPLICATION;  Surgeon: Sondra Come, MD;  Location: [facility];  Service: Urology;  Laterality: Right;   KNEE ARTHROSCOPY Bilateral    LEFT HEART CATH AND CORONARY ANGIOGRAPHY N/A 07/31/2021   Procedure: LEFT HEART CATH AND  CORONARY ANGIOGRAPHY;  Surgeon: Lamar Blinks, MD;  Location[facility] INVASIVE CV LAB;  Service: Cardiovascular;  Laterality: N/A;   POLYPECTOMY N/A 07/22/2022   Procedure: POLYPECTOMY;  Surgeon: Midge Minium, MD;  Location: [facility];  Service: Endoscopy;  Laterality: N/A;   REPLACEMENT TOTAL KNEE Right 02/2022   Marcelene Butte CUFF REPAIR Right 2019    SCLEROTHERAPY     legs    Home Medications:  Allergies as of 03/05/2023       Reactions   Isosorbide Nitrate Other (See Comments), Shortness Of Breath   Metoprolol Tartrate Other (See Comments), Shortness Of Breath   Chlorthalidone Other (See Comments)   Dizziness, palpitations        Medication List        Accurate as of March 05, 2023 10:57 AM. If you have any questions, ask your nurse or doctor.          atorvastatin 80 MG tablet Commonly known as: LIPITOR Take 1 tablet (80 mg total) by mouth daily.   ketorolac 10 MG tablet Commonly known as: TORADOL Take 1 tablet (10 mg total) by mouth every 6 (six) hours as needed.   losartan 50 MG tablet Commonly known as: COZAAR Take 1 tablet 50mg  by mouth daily for blood pressure   omeprazole 20 MG capsule Commonly known as: PRILOSEC Take 1 capsule (20 mg total) by mouth daily.   oxyCODONE-acetaminophen 5-325 MG tablet Commonly known as: PERCOCET/ROXICET Take 1 tablet by mouth every 6 (six) hours as needed for severe pain.   tamsulosin 0.4 MG Caps capsule Commonly known as: FLOMAX Take 1 capsule (0.4 mg total) by mouth daily for 14 days.        Allergies:  Allergies  Allergen Reactions   Isosorbide Nitrate Other (See Comments) and Shortness Of Breath   Metoprolol Tartrate Other (See Comments) and Shortness Of Breath   Chlorthalidone Other (See Comments)    Dizziness, palpitations    Family History: Family History  Problem Relation Age of Onset   CAD Father 29       MI   Hyperlipidemia Father    Diabetes Paternal Grandfather    Cancer Mother 3       deceased from bone cancer   Stroke Neg Hx     Social History:  reports that he has never smoked. He has been exposed to tobacco smoke. His smokeless tobacco use includes snuff. He reports current alcohol use. He reports that he does not use drugs.   Physical Exam: BP (!) 165/99   Pulse 84   Wt 250 lb (113.4 kg)   BMI 38.01 kg/m   Constitutional:   Alert and oriented, No acute distress. HEENT: [place] AT Cardiovascular: No clubbing, cyanosis, or edema.   Laboratory Data:  Urinalysis Dipstick 2+blood/trace protein. Microscopy 11-30 RBC.    Assessment & Plan:    1. Right proximal ureteral calculus We discussed various treatment options for urolithiasis including observation with or without medical expulsive therapy, shockwave lithotripsy (SWL), ureteroscopy and laser lithotripsy with stent placement. We discussed that management is based on stone size, location, density, patient co-morbidities, and patient preference.  Stones <52mm in size have a >80% spontaneous passage rate. Data surrounding the use of tamsulosin for medical expulsive therapy is controversial, but meta analyses suggests it is most efficacious for distal stones between 5-66mm in size. Possible side effects include dizziness/lightheadedness, and retrograde ejaculation. SWL has a lower stone free rate in a single procedure, but also a lower  complication rate compared to ureteroscopy and avoids a stent and associated stent related symptoms. Possible complications include renal hematoma, steinstrasse, and need for additional treatment. Ureteroscopy with laser lithotripsy and stent placement has a higher stone free rate than SWL in a single procedure, however increased complication rate including possible infection, ureteral injury, bleeding, and stent related morbidity. Common stent related symptoms include dysuria, urgency/frequency, and flank pain. After an extensive discussion of the risks and benefits of the above treatment options, the patient would like to schedule SWL and was sent for a KUB to see if the calculus is visualized.  After review of his KUB the calculus was not identified and he has elected to schedule ureteroscopic stone removal.    I have reviewed the above documentation for accuracy and completeness, and I agree with the above.   [name] Altes,  MD  [facility] [address], [address] [place], [place] [address] [phone]

## 2023-03-05 NOTE — H&P (View-Only) (Signed)
 I, [name], acting as a scribe for [name] Altes, MD., have documented all relevant documentation on the behalf of [name] Altes, MD, as directed by  [name] Altes, MD while in the presence of [name] Altes, MD.  03/05/2023 10:57 AM   [name] [name] Jun 09, 1961 [id number]  Referring provider: Eustaquio Boyden, MD [address] [place],  [place] [address]  Chief Complaint  Patient presents with   Nephrolithiasis   Urological history: 1. Nephrolithiasis -stone composition unknown -right URS (05/03/2022)    2. BPH with LU TS -PSA (07/2021) 0.21 -cysto (04/2022) - small prostate w/ small median lobe    HPI: [name] [name] is a 62 y.o. male presenting for evaluation of renal colic.  ED visit 03/03/23 for onset of right flank pain radiating to the right groin region, similar to previous episodes of renal colic. No fever, chills, nausea, or vomiting. Evaluation in the ED remarkable for urinalysis, which showed >50 RBCs. CT renal stone study was performed which was remarkable for a 4 mm right proximal ureteral calculus without significant hydronephrosis/hydroureter. Pain was managed with Ketorolac and he was discharged on Tamsulosin, Oxycodone, and oral Ketorolac. Since his ED visit, his pain has been managed with oral analgesics.    PMH: Past Medical History:  Diagnosis Date   CAD (coronary artery disease)    mild   Dyslipidemia    GERD (gastroesophageal reflux disease)    with esophagitis   History of Helicobacter pylori infection    Hypertension, essential    Obesity, Class I, BMI 30-34.9 01/19/2016    Surgical History: Past Surgical History:  Procedure Laterality Date   ANTERIOR CERVICAL DECOMP/DISCECTOMY FUSION  2012   C4-5 ([place])   ANTERIOR CERVICAL DECOMP/DISCECTOMY FUSION N/A 03/12/2018   ACDF C4-6, EXPLORATION OF FUSION C6-7, REMOVAL OF HARDWARE;  Shon Baton, [name], MD)   ARTERY BIOPSY Left 10/20/2019   TEMPORAL ARTERY BIOPSY - Gilda Crease,  Latina Craver, MD   CARDIAC CATHETERIZATION  2013   no significant CAD, nl LVEF 60% [place] Flakes)   COLONOSCOPY  2012   WNL per PCP report   COLONOSCOPY  10/06/2021   done for rectal bleed after polypectomy s/p clip, rec rpt colonoscopy 6 months to remove polyp seen ascending colon ([place])   COLONOSCOPY WITH PROPOFOL N/A 10/04/2021   TAx2, low grade dysplasia present, hemorrhoids, diverticulosis, rpt 3 yrs (Vanga, Loel Dubonnet, MD)   COLONOSCOPY WITH PROPOFOL N/A 10/06/2021   Procedure: COLONOSCOPY WITH PROPOFOL;  Surgeon: Regis Bill, MD;  Location: [facility];  Service: Endoscopy;  Laterality: N/A;   COLONOSCOPY WITH PROPOFOL N/A 07/22/2022   TAs, int hem, rpt 3 yrs Servando Snare, Darren, MD)   CT CTA CORONARY W/CA SCORE W/CM &/OR WO/CM  2015   minor luminal irregularities, Ca score = 90 [place] Flakes)   CYSTOSCOPY WITH URETEROSCOPY, STONE BASKETRY AND STENT PLACEMENT Right 05/03/2022   Procedure: CYSTOSCOPY WITH URETEROSCOPY, STONE BASKETRY AND STENT PLACEMENT;  Surgeon: Sondra Come, MD;  Location: [facility];  Service: Urology;  Laterality: Right;   FLEXIBLE SIGMOIDOSCOPY N/A 10/05/2021   Procedure: FLEXIBLE SIGMOIDOSCOPY;  Surgeon: Toney Reil, MD;  Location: [facility];  Service: Gastroenterology;  Laterality: N/A;   HOLMIUM LASER APPLICATION Right 05/03/2022   Procedure: HOLMIUM LASER APPLICATION;  Surgeon: Sondra Come, MD;  Location: [facility];  Service: Urology;  Laterality: Right;   KNEE ARTHROSCOPY Bilateral    LEFT HEART CATH AND CORONARY ANGIOGRAPHY N/A 07/31/2021   Procedure: LEFT HEART CATH AND  CORONARY ANGIOGRAPHY;  Surgeon: Lamar Blinks, MD;  Location[facility] INVASIVE CV LAB;  Service: Cardiovascular;  Laterality: N/A;   POLYPECTOMY N/A 07/22/2022   Procedure: POLYPECTOMY;  Surgeon: Midge Minium, MD;  Location: [facility];  Service: Endoscopy;  Laterality: N/A;   REPLACEMENT TOTAL KNEE Right 02/2022   Marcelene [place] CUFF REPAIR Right 2019    SCLEROTHERAPY     legs    Home Medications:  Allergies as of 03/05/2023       Reactions   Isosorbide Nitrate Other (See Comments), Shortness Of Breath   Metoprolol Tartrate Other (See Comments), Shortness Of Breath   Chlorthalidone Other (See Comments)   Dizziness, palpitations        Medication List        Accurate as of March 05, 2023 10:57 AM. If you have any questions, ask your nurse or doctor.          atorvastatin 80 MG tablet Commonly known as: LIPITOR Take 1 tablet (80 mg total) by mouth daily.   ketorolac 10 MG tablet Commonly known as: TORADOL Take 1 tablet (10 mg total) by mouth every 6 (six) hours as needed.   losartan 50 MG tablet Commonly known as: COZAAR Take 1 tablet 50mg  by mouth daily for blood pressure   omeprazole 20 MG capsule Commonly known as: PRILOSEC Take 1 capsule (20 mg total) by mouth daily.   oxyCODONE-acetaminophen 5-325 MG tablet Commonly known as: PERCOCET/ROXICET Take 1 tablet by mouth every 6 (six) hours as needed for severe pain.   tamsulosin 0.4 MG Caps capsule Commonly known as: FLOMAX Take 1 capsule (0.4 mg total) by mouth daily for 14 days.        Allergies:  Allergies  Allergen Reactions   Isosorbide Nitrate Other (See Comments) and Shortness Of Breath   Metoprolol Tartrate Other (See Comments) and Shortness Of Breath   Chlorthalidone Other (See Comments)    Dizziness, palpitations    Family History: Family History  Problem Relation Age of Onset   CAD Father 29       MI   Hyperlipidemia Father    Diabetes Paternal Grandfather    Cancer Mother 3       deceased from bone cancer   Stroke Neg Hx     Social History:  reports that he has never smoked. He has been exposed to tobacco smoke. His smokeless tobacco use includes snuff. He reports current alcohol use. He reports that he does not use drugs.   Physical Exam: BP (!) 165/99   Pulse 84   Wt 250 lb (113.4 kg)   BMI 38.01 kg/m   Constitutional:   Alert and oriented, No acute distress. HEENT: [place] AT Cardiovascular: No clubbing, cyanosis, or edema.   Laboratory Data:  Urinalysis Dipstick 2+blood/trace protein. Microscopy 11-30 RBC.    Assessment & Plan:    1. Right proximal ureteral calculus We discussed various treatment options for urolithiasis including observation with or without medical expulsive therapy, shockwave lithotripsy (SWL), ureteroscopy and laser lithotripsy with stent placement. We discussed that management is based on stone size, location, density, patient co-morbidities, and patient preference.  Stones <52mm in size have a >80% spontaneous passage rate. Data surrounding the use of tamsulosin for medical expulsive therapy is controversial, but meta analyses suggests it is most efficacious for distal stones between 5-66mm in size. Possible side effects include dizziness/lightheadedness, and retrograde ejaculation. SWL has a lower stone free rate in a single procedure, but also a lower  complication rate compared to ureteroscopy and avoids a stent and associated stent related symptoms. Possible complications include renal hematoma, steinstrasse, and need for additional treatment. Ureteroscopy with laser lithotripsy and stent placement has a higher stone free rate than SWL in a single procedure, however increased complication rate including possible infection, ureteral injury, bleeding, and stent related morbidity. Common stent related symptoms include dysuria, urgency/frequency, and flank pain. After an extensive discussion of the risks and benefits of the above treatment options, the patient would like to schedule SWL and was sent for a KUB to see if the calculus is visualized.  After review of his KUB the calculus was not identified and he has elected to schedule ureteroscopic stone removal.    I have reviewed the above documentation for accuracy and completeness, and I agree with the above.   [name] Altes,  MD  [facility] [address], [address] [place], [place] [address] [phone]

## 2023-03-06 ENCOUNTER — Other Ambulatory Visit: Payer: Self-pay | Admitting: Urology

## 2023-03-06 DIAGNOSIS — N201 Calculus of ureter: Secondary | ICD-10-CM

## 2023-03-07 ENCOUNTER — Other Ambulatory Visit: Payer: Self-pay

## 2023-03-07 DIAGNOSIS — N201 Calculus of ureter: Secondary | ICD-10-CM

## 2023-03-10 ENCOUNTER — Encounter
Admission: RE | Admit: 2023-03-10 | Discharge: 2023-03-10 | Disposition: A | Discharge status: Home / Self Care | Payer: 59 | Source: Ambulatory Visit | Attending: Urology | Admitting: Urology

## 2023-03-10 HISTORY — DX: Unspecified osteoarthritis, unspecified site: M19.90

## 2023-03-10 HISTORY — DX: Rheumatic tricuspid insufficiency: I07.1

## 2023-03-10 HISTORY — DX: COVID-19: U07.1

## 2023-03-10 HISTORY — DX: Psychophysiologic insomnia: F51.04

## 2023-03-10 HISTORY — DX: Prediabetes: R73.03

## 2023-03-10 HISTORY — DX: Asymptomatic varicose veins of unspecified lower extremity: I83.90

## 2023-03-10 HISTORY — DX: Fatty (change of) liver, not elsewhere classified: K76.0

## 2023-03-10 HISTORY — DX: Occlusion and stenosis of bilateral carotid arteries: I65.23

## 2023-03-10 HISTORY — DX: Other ill-defined heart diseases: I51.89

## 2023-03-10 HISTORY — DX: Nonrheumatic mitral (valve) insufficiency: I34.0

## 2023-03-10 HISTORY — DX: Radiculopathy, cervical region: M54.12

## 2023-03-10 HISTORY — DX: Personal history of urinary calculi: Z87.442

## 2023-03-10 HISTORY — DX: Peripheral vascular disease, unspecified: I73.9

## 2023-03-10 NOTE — Patient Instructions (Addendum)
Your procedure is scheduled on:03-11-23 Tuesday Report to the Registration Desk on the 1st floor of the Medical Mall.Then proceed to the 2nd floor Surgery Desk To find out your arrival time, please call [phone] between 1PM - 3PM on:03-10-23 Monday  If your arrival time is 6:00 am, do not arrive before that time as the Medical Mall entrance doors do not open until 6:00 am.  REMEMBER: Instructions that are not followed completely may result in serious medical risk, up to and including death; or upon the discretion of your surgeon and anesthesiologist your surgery may need to be rescheduled.  Do not eat food OR drink any liquids after midnight the night before surgery.  No gum chewing or hard candies  One week prior to surgery:Stop NOW 03-10-23 Stop Anti-inflammatories (NSAIDS) such as ketorolac (TORADOL), Advil, Aleve, Ibuprofen, Motrin, Naproxen, Naprosyn and Aspirin based products such as Excedrin, Goody's Powder, BC Powder. You may however, continue to take Tylenol/Percocet if needed for pain up until the day of surgery.  Continue taking all prescribed medications   TAKE ONLY THESE MEDICATIONS THE MORNING OF SURGERY WITH A SIP OF WATER: -atorvastatin (LIPITOR)  -omeprazole (PRILOSEC)-take one the night before and one on the morning of surgery - helps to prevent nausea after surgery.)  No Alcohol for 24 hours before or after surgery.  No Smoking including e-cigarettes for 24 hours before surgery.  No chewable tobacco products for at least 6 hours before surgery.  No nicotine patches on the day of surgery.  Do not use any "recreational" drugs for at least a week (preferably 2 weeks) before your surgery.  Please be advised that the combination of cocaine and anesthesia may have negative outcomes, up to and including death. If you test positive for cocaine, your surgery will be cancelled.  On the morning of surgery brush your teeth with toothpaste and water, you may rinse your mouth  with mouthwash if you wish. Do not swallow any toothpaste or mouthwash.  Do not wear jewelry, make-up, hairpins, clips or nail polish.  Do not wear lotions, powders, or perfumes.   Do not shave body hair from the neck down 48 hours before surgery.  Contact lenses, hearing aids and dentures may not be worn into surgery.  Do not bring valuables to the hospital. [facility] is not responsible for any missing/lost belongings or valuables.   Notify your doctor if there is any change in your medical condition (cold, fever, infection).  Wear comfortable clothing (specific to your surgery type) to the hospital.  After surgery, you can help prevent lung complications by doing breathing exercises.  Take deep breaths and cough every 1-2 hours. Your doctor may order a device called an Incentive Spirometer to help you take deep breaths. When coughing or sneezing, hold a pillow firmly against your incision with both hands. This is called "splinting." Doing this helps protect your incision. It also decreases belly discomfort.  If you are being admitted to the hospital overnight, leave your suitcase in the car. After surgery it may be brought to your room.  In case of increased patient census, it may be necessary for you, the patient, to continue your postoperative care in the Same Day Surgery department.  If you are being discharged the day of surgery, you will not be allowed to drive home. You will need a responsible individual to drive you home and stay with you for 24 hours after surgery.   If you are taking public transportation, you will need  to have a responsible individual with you.  Please call the Pre-admissions Testing Dept. at [phone] if you have any questions about these instructions.  Surgery Visitation Policy:  Patients having surgery or a procedure may have two visitors.  Children under the age of 107 must have an adult with them who is not the patient.

## 2023-03-11 ENCOUNTER — Encounter
Admission: RE | Disposition: A | Discharge status: Home / Self Care | Payer: Self-pay | Source: Home / Self Care | Attending: Urology

## 2023-03-11 ENCOUNTER — Ambulatory Visit: Payer: 59 | Admitting: Anesthesiology

## 2023-03-11 ENCOUNTER — Ambulatory Visit: Payer: 59

## 2023-03-11 ENCOUNTER — Encounter: Payer: Self-pay | Admitting: Urology

## 2023-03-11 ENCOUNTER — Ambulatory Visit
Admission: RE | Admit: 2023-03-11 | Discharge: 2023-03-11 | Disposition: A | Discharge status: Home / Self Care | Payer: 59 | Attending: Urology | Admitting: Urology

## 2023-03-11 ENCOUNTER — Ambulatory Visit: Payer: 59 | Admitting: Urgent Care

## 2023-03-11 ENCOUNTER — Other Ambulatory Visit: Payer: Self-pay

## 2023-03-11 DIAGNOSIS — K219 Gastro-esophageal reflux disease without esophagitis: Secondary | ICD-10-CM | POA: Insufficient documentation

## 2023-03-11 DIAGNOSIS — I25119 Atherosclerotic heart disease of native coronary artery with unspecified angina pectoris: Secondary | ICD-10-CM

## 2023-03-11 DIAGNOSIS — N201 Calculus of ureter: Secondary | ICD-10-CM | POA: Diagnosis not present

## 2023-03-11 DIAGNOSIS — Z01810 Encounter for preprocedural cardiovascular examination: Secondary | ICD-10-CM

## 2023-03-11 DIAGNOSIS — I739 Peripheral vascular disease, unspecified: Secondary | ICD-10-CM | POA: Diagnosis not present

## 2023-03-11 DIAGNOSIS — I10 Essential (primary) hypertension: Secondary | ICD-10-CM | POA: Insufficient documentation

## 2023-03-11 DIAGNOSIS — I2510 Atherosclerotic heart disease of native coronary artery without angina pectoris: Secondary | ICD-10-CM | POA: Diagnosis not present

## 2023-03-11 HISTORY — PX: CYSTOSCOPY/URETEROSCOPY/HOLMIUM LASER/STENT PLACEMENT: SHX6546

## 2023-03-11 SURGERY — CYSTOSCOPY/URETEROSCOPY/HOLMIUM LASER/STENT PLACEMENT
Anesthesia: General | Laterality: Right

## 2023-03-11 MED ORDER — LIDOCAINE HCL (CARDIAC) PF 100 MG/5ML IV SOSY
PREFILLED_SYRINGE | INTRAVENOUS | Status: DC | PRN
  Administered 2023-03-11: 100 mg via INTRAVENOUS

## 2023-03-11 MED ORDER — SODIUM CHLORIDE 0.9 % IR SOLN
Status: DC | PRN
  Administered 2023-03-11: 1 via INTRAVESICAL

## 2023-03-11 MED ORDER — KETOROLAC TROMETHAMINE 30 MG/ML IJ SOLN
INTRAMUSCULAR | Status: DC | PRN
  Administered 2023-03-11: 30 mg via INTRAVENOUS

## 2023-03-11 MED ORDER — SUGAMMADEX SODIUM 200 MG/2ML IV SOLN
INTRAVENOUS | Status: DC | PRN
  Administered 2023-03-11: 300 mg via INTRAVENOUS

## 2023-03-11 MED ORDER — ACETAMINOPHEN 10 MG/ML IV SOLN: 1000.0000 mg | Freq: Once | INTRAVENOUS | Status: DC | PRN

## 2023-03-11 MED ORDER — LIDOCAINE HCL (PF) 2 % IJ SOLN
INTRAMUSCULAR | Status: AC
  Filled 2023-03-11: qty 5

## 2023-03-11 MED ORDER — MIDAZOLAM HCL 2 MG/2ML IJ SOLN
INTRAMUSCULAR | Status: AC
  Filled 2023-03-11: qty 2

## 2023-03-11 MED ORDER — OXYBUTYNIN CHLORIDE 5 MG PO TABS: ORAL_TABLET | ORAL | 0 refills | Status: DC

## 2023-03-11 MED ORDER — OXYCODONE HCL 5 MG PO TABS: 5.0000 mg | ORAL_TABLET | Freq: Once | ORAL | Status: DC | PRN

## 2023-03-11 MED ORDER — ONDANSETRON HCL 4 MG/2ML IJ SOLN
INTRAMUSCULAR | Status: AC
  Filled 2023-03-11: qty 2

## 2023-03-11 MED ORDER — ROCURONIUM BROMIDE 100 MG/10ML IV SOLN
INTRAVENOUS | Status: DC | PRN
  Administered 2023-03-11: 50 mg via INTRAVENOUS
  Administered 2023-03-11: 20 mg via INTRAVENOUS

## 2023-03-11 MED ORDER — PROPOFOL 10 MG/ML IV BOLUS
INTRAVENOUS | Status: AC
  Filled 2023-03-11: qty 20

## 2023-03-11 MED ORDER — DEXAMETHASONE SODIUM PHOSPHATE 10 MG/ML IJ SOLN
INTRAMUSCULAR | Status: AC
  Filled 2023-03-11: qty 1

## 2023-03-11 MED ORDER — FENTANYL CITRATE (PF) 100 MCG/2ML IJ SOLN
INTRAMUSCULAR | Status: DC | PRN
  Administered 2023-03-11: 100 ug via INTRAVENOUS

## 2023-03-11 MED ORDER — ACETAMINOPHEN 10 MG/ML IV SOLN
INTRAVENOUS | Status: DC | PRN
  Administered 2023-03-11: 1000 mg via INTRAVENOUS

## 2023-03-11 MED ORDER — OXYCODONE HCL 5 MG/5ML PO SOLN: 5.0000 mg | Freq: Once | ORAL | Status: DC | PRN

## 2023-03-11 MED ORDER — KETOROLAC TROMETHAMINE 30 MG/ML IJ SOLN
INTRAMUSCULAR | Status: AC
  Filled 2023-03-11: qty 1

## 2023-03-11 MED ORDER — ONDANSETRON HCL 4 MG/2ML IJ SOLN
INTRAMUSCULAR | Status: DC | PRN
  Administered 2023-03-11: 4 mg via INTRAVENOUS

## 2023-03-11 MED ORDER — FENTANYL CITRATE (PF) 100 MCG/2ML IJ SOLN
INTRAMUSCULAR | Status: AC
  Filled 2023-03-11: qty 2

## 2023-03-11 MED ORDER — CEFAZOLIN SODIUM-DEXTROSE 2-4 GM/100ML-% IV SOLN
2.0000 g | INTRAVENOUS | Status: AC
  Administered 2023-03-11: 2 g via INTRAVENOUS

## 2023-03-11 MED ORDER — PROPOFOL 10 MG/ML IV BOLUS
INTRAVENOUS | Status: DC | PRN
  Administered 2023-03-11: 200 mg via INTRAVENOUS

## 2023-03-11 MED ORDER — ACETAMINOPHEN 10 MG/ML IV SOLN
INTRAVENOUS | Status: AC
  Filled 2023-03-11: qty 100

## 2023-03-11 MED ORDER — CHLORHEXIDINE GLUCONATE 0.12 % MT SOLN
15.0000 mL | Freq: Once | OROMUCOSAL | Status: AC
  Administered 2023-03-11: 15 mL via OROMUCOSAL

## 2023-03-11 MED ORDER — PROMETHAZINE HCL 25 MG/ML IJ SOLN: 6.2500 mg | INTRAMUSCULAR | Status: DC | PRN

## 2023-03-11 MED ORDER — CHLORHEXIDINE GLUCONATE 0.12 % MT SOLN
OROMUCOSAL | Status: AC
  Filled 2023-03-11: qty 15

## 2023-03-11 MED ORDER — MIDAZOLAM HCL 2 MG/2ML IJ SOLN
INTRAMUSCULAR | Status: DC | PRN
  Administered 2023-03-11: 2 mg via INTRAVENOUS

## 2023-03-11 MED ORDER — ROCURONIUM BROMIDE 10 MG/ML (PF) SYRINGE
PREFILLED_SYRINGE | INTRAVENOUS | Status: AC
  Filled 2023-03-11: qty 10

## 2023-03-11 MED ORDER — DEXAMETHASONE SODIUM PHOSPHATE 10 MG/ML IJ SOLN
INTRAMUSCULAR | Status: DC | PRN
  Administered 2023-03-11: 5 mg via INTRAVENOUS

## 2023-03-11 MED ORDER — FENTANYL CITRATE (PF) 100 MCG/2ML IJ SOLN: 25.0000 ug | INTRAMUSCULAR | Status: DC | PRN

## 2023-03-11 MED ORDER — DROPERIDOL 2.5 MG/ML IJ SOLN: 0.6250 mg | Freq: Once | INTRAMUSCULAR | Status: DC | PRN

## 2023-03-11 MED ORDER — LACTATED RINGERS IV SOLN: INTRAVENOUS | Status: DC

## 2023-03-11 MED ORDER — OXYCODONE-ACETAMINOPHEN 5-325 MG PO TABS: 1.0000 | ORAL_TABLET | Freq: Four times a day (QID) | ORAL | 0 refills | Status: DC | PRN

## 2023-03-11 MED ORDER — CEFAZOLIN SODIUM-DEXTROSE 2-4 GM/100ML-% IV SOLN
INTRAVENOUS | Status: AC
  Filled 2023-03-11: qty 100

## 2023-03-11 MED ORDER — ORAL CARE MOUTH RINSE: 15.0000 mL | Freq: Once | OROMUCOSAL | Status: AC

## 2023-03-11 MED ORDER — IOHEXOL 180 MG/ML  SOLN
INTRAMUSCULAR | Status: DC | PRN
  Administered 2023-03-11: 10 mL

## 2023-03-11 SURGICAL SUPPLY — 23 items
ADH LQ OCL WTPRF AMP STRL LF (MISCELLANEOUS) ×1
ADHESIVE MASTISOL STRL (MISCELLANEOUS) IMPLANT
BAG DRAIN SIEMENS DORNER NS (MISCELLANEOUS) ×1 IMPLANT
BAG DRN NS LF (MISCELLANEOUS) ×1
BASKET ZERO TIP 1.9FR (BASKET) IMPLANT
BRUSH SCRUB EZ 1% IODOPHOR (MISCELLANEOUS) ×1 IMPLANT
BSKT STON RTRVL ZERO TP 1.9FR (BASKET) ×1
DRSG TEGADERM 2-3/8X2-3/4 SM (GAUZE/BANDAGES/DRESSINGS) IMPLANT
FIBER LASER MOSES 200 DFL (Laser) IMPLANT
GLOVE BIOGEL PI IND STRL 7.5 (GLOVE) ×1 IMPLANT
GOWN STRL REUS W/ TWL LRG LVL3 (GOWN DISPOSABLE) ×1 IMPLANT
GOWN STRL REUS W/ TWL XL LVL3 (GOWN DISPOSABLE) ×1 IMPLANT
GOWN STRL REUS W/TWL LRG LVL3 (GOWN DISPOSABLE) ×1
GOWN STRL REUS W/TWL XL LVL3 (GOWN DISPOSABLE) ×1
GUIDEWIRE STR DUAL SENSOR (WIRE) ×1 IMPLANT
IV NS IRRIG 3000ML ARTHROMATIC (IV SOLUTION) ×1 IMPLANT
KIT TURNOVER CYSTO (KITS) ×1 IMPLANT
PACK CYSTO AR (MISCELLANEOUS) ×1 IMPLANT
SET CYSTO W/LG BORE CLAMP LF (SET/KITS/TRAYS/PACK) ×1 IMPLANT
SHEATH NAVIGATOR HD 12/14X36 (SHEATH) IMPLANT
STENT URET 6FRX24 CONTOUR (STENTS) IMPLANT
SURGILUBE 2OZ TUBE FLIPTOP (MISCELLANEOUS) ×1 IMPLANT
WATER STERILE IRR 500ML POUR (IV SOLUTION) ×1 IMPLANT

## 2023-03-11 NOTE — Transfer of Care (Signed)
Immediate Anesthesia Transfer of Care Note  Patient: [name] [name]  Procedure(s) Performed: CYSTOSCOPY/URETEROSCOPY/HOLMIUM LASER/STENT PLACEMENT (Right)  Patient Location: PACU  Anesthesia Type:General  Level of Consciousness: awake and alert   Airway & Oxygen Therapy: Patient Spontanous Breathing and Patient connected to face mask oxygen  Post-op Assessment: Report given to RN, Post -op Vital signs reviewed and stable, and Patient moving all extremities  Post vital signs: Reviewed and stable  Last Vitals:  Vitals Value Taken Time  BP 181/95 03/11/23 1031  Temp 36.3 C 03/11/23 1031  Pulse 67 03/11/23 1035  Resp 16 03/11/23 1035  SpO2 98 % 03/11/23 1035  Vitals shown include unfiled device data.  Last Pain:  Vitals:   03/11/23 1031  TempSrc:   PainSc: 0-No pain         Complications: No notable events documented.

## 2023-03-11 NOTE — Op Note (Signed)
   Preoperative diagnosis: Right proximal ureteral calculus  Postoperative diagnosis: Right distal ureteral calculus  Procedure:  Cystoscopy Right ureteroscopy ureteroscopy and stone removal Ureteroscopic laser lithotripsy Right ureteral stent placement (12F/24 cm)  Right retrograde pyelography with interpretation  Surgeon: Lorin Picket C. Alanya Vukelich, M.D.  Anesthesia: General  Complications: None  Intraoperative findings:  Cystoscopy-urethra normal in caliber without stricture; mild lateral lobe enlargement, small median lobe.  Bladder mucosa normal in appearance without erythema, solid or papillary lesion Ureteroscopy-spiculated calculus notified in the distal ureter Right retrograde pyelography post procedure showed no filling defects, stone fragments or contrast extravasation  EBL: Minimal  Specimens: Calculus fragments for analysis   Indication: [name] [name] is a 62 y.o. male with a history of recurrent stone disease status post ureteroscopic stone removal in September 2023.  Recent onset of right renal colic and CT showing a 4 mm right proximal ureteral calculus.  The stone was not visualized on KUB.  After reviewing the management options for treatment, the patient elected to proceed with the above surgical procedure(s). We have discussed the potential benefits and risks of the procedure, side effects of the proposed treatment, the likelihood of the patient achieving the goals of the procedure, and any potential problems that might occur during the procedure or recuperation. Informed consent has been obtained.  Description of procedure:  The patient was taken to the operating room and general anesthesia was induced.  The patient was placed in the dorsal lithotomy position, prepped and draped in the usual sterile fashion, and preoperative antibiotics were administered. A preoperative time-out was performed.   A 21 French cystoscope was lubricated and placed per urethra and advanced  proximally into the bladder with findings as described above.   Attention was directed to the right ureteral orifice and a 0.038 Sensor wire was then advanced up the ureter into the renal pelvis under fluoroscopic guidance.  A 4.5 Fr semirigid ureteroscope was then advanced into the ureter next to the guidewire and the calculus was identified using the distal ureter.  The stone was then fragmented with a 200 m Moses holmium laser fiber at a setting of 0.2 J/40 hz.   All fragments were then removed from the ureter with a zero tip nitinol basket.  Reinspection of the ureter revealed no remaining visible stones or fragments.   Retrograde pyelogram was performed with findings as described above.  A 6 FR/24 CM Contour ureteral stent with tether was placed under fluoroscopic guidance.  The wire was then removed with an adequate stent curl noted in the renal pelvis as well as in the bladder.  The stent string was secured to the dorsum of the penile shaft with Mastisol and Tegaderm  The bladder was then emptied and the procedure ended.  The patient appeared to tolerate the procedure well and without complications.  After anesthetic reversal the patient was transported to the PACU in stable condition.   Plan: He will remove the stent on 03/12/2023 1 month postop follow-up   Irineo Axon, MD

## 2023-03-11 NOTE — Anesthesia Preprocedure Evaluation (Signed)
Anesthesia Evaluation  Patient identified by MRN, date of birth, ID band Patient awake    Reviewed: Allergy & Precautions, NPO status , [name] Chart, lab work & pertinent test results  History of Anesthesia Complications Negative for: history of anesthetic complications  Airway Mallampati: III  TM Distance: <3 FB Neck ROM: full    Dental no notable dental hx. (+) Dental Advidsory Given   Pulmonary neg shortness of breath, Patient abstained from smoking.   Pulmonary exam normal        Cardiovascular Exercise Tolerance: Good hypertension, Pt. on medications (-) angina + CAD and + Peripheral Vascular Disease (peripheral vascular disease with bilateral carotid artery stenosis on [place])  Normal cardiovascular exam+ dysrhythmias   MPS 2022: LVEF= 46%   FINDINGS:  Regional wall motion:  reveals normal myocardial thickening and wall  motion.  The overall quality of the study is good.    Artifacts noted: no  Left ventricular cavity: normal.     Neuro/Psych S/P cervical spinal fusion  negative psych ROS   GI/Hepatic Neg liver ROS,GERD  Controlled,,  Endo/Other  negative endocrine ROS    Renal/GU negative Renal ROS  negative genitourinary   Musculoskeletal  (+) Arthritis ,    Abdominal  (+) + obese  Peds  Hematology negative hematology ROS (+)   Anesthesia Other Findings Past Medical History: No date: Alcohol use     Comment:  regularly 3 beers/day No date: CAD (coronary artery disease)     Comment:  mild No date: Dyslipidemia No date: GERD (gastroesophageal reflux disease)     Comment:  with esophagitis No date: History of Helicobacter pylori infection No date: Hypertension, essential 01/19/2016: Obesity, Class I, BMI 30-34.9  Past Surgical History: 2012: ANTERIOR CERVICAL DECOMP/DISCECTOMY FUSION     Comment:  C4-5 [facility] 03/12/2018: ANTERIOR CERVICAL DECOMP/DISCECTOMY FUSION; N/A     Comment:  ACDF C4-6,  EXPLORATION OF FUSION C6-7, REMOVAL OF               HARDWARE;  Shon Baton, Debria Garret, MD) 10/20/2019: ARTERY BIOPSY; Left     Comment:  TEMPORAL ARTERY BIOPSY - Schnier, Latina Craver, MD 2013: CARDIAC CATHETERIZATION     Comment:  no significant CAD, nl LVEF 60% [place] Flakes) 2012: COLONOSCOPY     Comment:  WNL per PCP report 2015: CT CTA CORONARY W/CA SCORE W/CM &/OR WO/CM     Comment:  minor luminal irregularities, Ca score = 90 [place] Flakes) No date: KNEE ARTHROSCOPY; Bilateral 07/31/2021: LEFT HEART CATH AND CORONARY ANGIOGRAPHY; N/A     Comment:  Procedure: LEFT HEART CATH AND CORONARY ANGIOGRAPHY;                Surgeon: Lamar Blinks, MD;  Location: [facility] INVASIVE               CV LAB;  Service: Cardiovascular;  Laterality: N/A; 2019: [place] CUFF REPAIR; Right No date: SCLEROTHERAPY     Comment:  legs  BMI    Body Mass Index: 38.01 kg/m      Reproductive/Obstetrics negative OB ROS                              Anesthesia Physical Anesthesia Plan  ASA: 2  Anesthesia Plan: General   Post-op Pain Management: Toradol IV (intra-op)* and Ofirmev IV (intra-op)*   Induction: Intravenous  PONV Risk Score and Plan: 2 and Midazolam, Dexamethasone and Ondansetron  Airway Management Planned: LMA  Additional Equipment:  Intra-op Plan:   Post-operative Plan:   Informed Consent:      Dental Advisory Given  Plan Discussed with: Anesthesiologist, CRNA and Surgeon  Anesthesia Plan Comments:          Anesthesia Quick Evaluation

## 2023-03-11 NOTE — Interval H&P Note (Signed)
History and Physical Interval Note:  Continues with intermittent right flank pain and not aware of passing a stone.  CV: RRR Lungs: Clear  03/11/2023 9:11 AM  Doc Delma Post  has presented today for surgery, with the diagnosis of Right Ureteral Stone.  The various methods of treatment have been discussed with the patient and family. After consideration of risks, benefits and other options for treatment, the patient has consented to  Procedure(s): CYSTOSCOPY/URETEROSCOPY/HOLMIUM LASER/STENT PLACEMENT (Right) as a surgical intervention.  The patient's history has been reviewed, patient examined, no change in status, stable for surgery.  I have reviewed the patient's chart and labs.  Questions were answered to the patient's satisfaction.     Damon Baisch C Judene Logue

## 2023-03-11 NOTE — Discharge Instructions (Addendum)
AMBULATORY SURGERY  DISCHARGE INSTRUCTIONS   The drugs that you were given will stay in your system until tomorrow so for the next 24 hours you should not:  Drive an automobile Make any legal decisions Drink any alcoholic beverage   You may resume regular meals tomorrow.  Today it is better to start with liquids and gradually work up to solid foods.  You may eat anything you prefer, but it is better to start with liquids, then soup and crackers, and gradually work up to solid foods.   Please notify your doctor immediately if you have any unusual bleeding, trouble breathing, redness and pain at the surgery site, drainage, fever, or pain not relieved by medication.   Additional Instructions:  DISCHARGE INSTRUCTIONS FOR KIDNEY STONE/URETERAL STENT   MEDICATIONS:  1. Resume all your other meds from home.  2.  AZO (over-the-counter) can help with the burning/stinging when you urinate. 3.  Oxycodone is for moderate/severe pain, Rx was sent to your pharmacy. 4.  Rx oxybutynin for stent irritation was sent to pharmacy  ACTIVITY:  1. May resume regular activities in 24 hours. 2. No driving while on narcotic pain medications  3. Drink plenty of water  4. Continue to walk at home - you can still get blood clots when you are at home, so keep active, but don't over do it.  5. May return to work/school tomorrow or when you feel ready   BATHING:  1. You can shower. 2. You have a string coming from your urethra: The stent string is attached to your ureteral stent. Do not pull on this.   SIGNS/SYMPTOMS TO CALL:  Common postoperative symptoms include urinary frequency, urgency, bladder spasm and blood in the urine  Please call us if you have a fever greater than 101.5, uncontrolled nausea/vomiting, uncontrolled pain, dizziness, unable to urinate, excessively bloody urine, chest pain, shortness of breath, leg swelling, leg pain, or any other concerns or questions.   You can reach [place] at  [phone].   FOLLOW-UP:  1. You will be contacted for a follow-up appointment in approximately 1 month 2. You have a string attached to your stent, you may remove it on Wednesday, 03/12/2023. To do this, pull the string until the stent is completely removed. You may feel an odd sensation in your back.

## 2023-03-11 NOTE — Anesthesia Postprocedure Evaluation (Signed)
Anesthesia Post Note  Patient: [name]  Procedure(s) Performed: CYSTOSCOPY/URETEROSCOPY/HOLMIUM LASER/STENT PLACEMENT (Right)  Patient location during evaluation: PACU Anesthesia Type: General Level of consciousness: awake and alert Pain management: pain level controlled Vital Signs Assessment: post-procedure vital signs reviewed and stable Respiratory status: spontaneous breathing, nonlabored ventilation and respiratory function stable Cardiovascular status: blood pressure returned to baseline and stable Postop Assessment: no apparent nausea or vomiting Anesthetic complications: no   No notable events documented.   Last Vitals:  Vitals:   03/11/23 1059 03/11/23 1109  BP: (!) 162/95 (!) 178/72  Pulse: 63 (!) 56  Resp: 14 18  Temp: (!) 36.3 C (!) 36.1 C  SpO2: 100% 97%    Last Pain:  Vitals:   03/11/23 1109  TempSrc: Temporal  PainSc: 0-No pain                 Foye Deer

## 2023-03-11 NOTE — Anesthesia Procedure Notes (Signed)
Procedure Name: Intubation Date/Time: 03/11/2023 9:43 AM  Performed by: Elisabeth Pigeon, CRNAPre-anesthesia Checklist: Patient identified, Emergency Drugs available, Suction available, Patient being monitored and Timeout performed Patient Re-evaluated:Patient Re-evaluated prior to induction Oxygen Delivery Method: Circle system utilized Preoxygenation: Pre-oxygenation with 100% oxygen Induction Type: IV induction Ventilation: Mask ventilation without difficulty Laryngoscope Size: McGraph and 4 Grade View: Grade I Tube type: Oral Tube size: 7.5 mm Number of attempts: 1 Airway Equipment and Method: Stylet and Oral airway Placement Confirmation: ETT inserted through vocal cords under direct vision, positive ETCO2 and breath sounds checked- equal and bilateral Secured at: 23 cm Tube secured with: Tape Dental Injury: Teeth and Oropharynx as per pre-operative assessment

## 2023-03-12 ENCOUNTER — Encounter: Payer: Self-pay | Admitting: Urology

## 2023-03-19 LAB — CALCULI, WITH PHOTOGRAPH (CLINICAL LAB)
Calcium Oxalate Dihydrate: 50 %
Calcium Oxalate Monohydrate: 10 %
Hydroxyapatite: 40 %
Weight Calculi: 7 mg

## 2023-03-20 ENCOUNTER — Encounter: Payer: Self-pay | Admitting: Family Medicine

## 2023-03-20 DIAGNOSIS — N200 Calculus of kidney: Secondary | ICD-10-CM | POA: Insufficient documentation

## 2023-04-11 ENCOUNTER — Encounter: Payer: 59 | Admitting: Urology

## 2024-02-23 ENCOUNTER — Other Ambulatory Visit: Payer: Self-pay | Admitting: Family Medicine

## 2024-02-23 DIAGNOSIS — E785 Hyperlipidemia, unspecified: Secondary | ICD-10-CM

## 2024-02-23 DIAGNOSIS — Z8249 Family history of ischemic heart disease and other diseases of the circulatory system: Secondary | ICD-10-CM

## 2024-02-23 DIAGNOSIS — Z125 Encounter for screening for malignant neoplasm of prostate: Secondary | ICD-10-CM

## 2024-02-23 DIAGNOSIS — R7303 Prediabetes: Secondary | ICD-10-CM

## 2024-02-26 ENCOUNTER — Other Ambulatory Visit

## 2024-03-04 ENCOUNTER — Other Ambulatory Visit: Payer: Self-pay | Admitting: Family Medicine

## 2024-03-04 DIAGNOSIS — K219 Gastro-esophageal reflux disease without esophagitis: Secondary | ICD-10-CM

## 2024-03-04 NOTE — Telephone Encounter (Signed)
 Pt has CPE tomorrow. Will send refill at [facility].

## 2024-03-05 ENCOUNTER — Ambulatory Visit (INDEPENDENT_AMBULATORY_CARE_PROVIDER_SITE_OTHER): Admitting: Family Medicine

## 2024-03-05 ENCOUNTER — Encounter: Payer: Self-pay | Admitting: Family Medicine

## 2024-03-05 VITALS — BP 180/100 | HR 78 | Temp 98.0°F | Ht 68.0 in | Wt 250.0 lb

## 2024-03-05 DIAGNOSIS — I10 Essential (primary) hypertension: Secondary | ICD-10-CM

## 2024-03-05 DIAGNOSIS — E785 Hyperlipidemia, unspecified: Secondary | ICD-10-CM

## 2024-03-05 DIAGNOSIS — Z125 Encounter for screening for malignant neoplasm of prostate: Secondary | ICD-10-CM

## 2024-03-05 DIAGNOSIS — F1090 Alcohol use, unspecified, uncomplicated: Secondary | ICD-10-CM | POA: Diagnosis not present

## 2024-03-05 DIAGNOSIS — Z0001 Encounter for general adult medical examination with abnormal findings: Secondary | ICD-10-CM

## 2024-03-05 DIAGNOSIS — R7303 Prediabetes: Secondary | ICD-10-CM | POA: Diagnosis not present

## 2024-03-05 DIAGNOSIS — I2510 Atherosclerotic heart disease of native coronary artery without angina pectoris: Secondary | ICD-10-CM

## 2024-03-05 DIAGNOSIS — K219 Gastro-esophageal reflux disease without esophagitis: Secondary | ICD-10-CM | POA: Diagnosis not present

## 2024-03-05 DIAGNOSIS — Z8249 Family history of ischemic heart disease and other diseases of the circulatory system: Secondary | ICD-10-CM | POA: Insufficient documentation

## 2024-03-05 LAB — COMPREHENSIVE METABOLIC PANEL WITH GFR
ALT: 24 U/L (ref 0–53)
AST: 20 U/L (ref 0–37)
Albumin: 4.2 g/dL (ref 3.5–5.2)
Alkaline Phosphatase: 112 U/L (ref 39–117)
BUN: 15 mg/dL (ref 6–23)
CO2: 31 meq/L (ref 19–32)
Calcium: 9.3 mg/dL (ref 8.4–10.5)
Chloride: 104 meq/L (ref 96–112)
Creatinine, Ser: 0.86 mg/dL (ref 0.40–1.50)
GFR: 93.29 mL/min (ref >60.00)
Glucose, Bld: 96 mg/dL (ref 70–99)
Potassium: 4 meq/L (ref 3.5–5.1)
Sodium: 141 meq/L (ref 135–145)
Total Bilirubin: 0.7 mg/dL (ref 0.2–1.2)
Total Protein: 6.4 g/dL (ref 6.0–8.3)

## 2024-03-05 LAB — FOLATE: Folate: 12.4 ng/mL (ref >5.9)

## 2024-03-05 LAB — LIPID PANEL
Cholesterol: 139 mg/dL (ref 0–200)
HDL: 37.6 mg/dL — ABNORMAL LOW (ref >39.00)
LDL Cholesterol: 84 mg/dL (ref 0–99)
NonHDL: 101.43
Total CHOL/HDL Ratio: 4
Triglycerides: 89 mg/dL (ref 0.0–149.0)
VLDL: 17.8 mg/dL (ref 0.0–40.0)

## 2024-03-05 LAB — CBC WITH DIFFERENTIAL/PLATELET
Basophils Absolute: 0 K/uL (ref 0.0–0.1)
Basophils Relative: 0.6 % (ref 0.0–3.0)
Eosinophils Absolute: 0.1 K/uL (ref 0.0–0.7)
Eosinophils Relative: 2.5 % (ref 0.0–5.0)
HCT: 39.8 % (ref 39.0–52.0)
Hemoglobin: 13.5 g/dL (ref 13.0–17.0)
Lymphocytes Relative: 27.5 % (ref 12.0–46.0)
Lymphs Abs: 1.6 K/uL (ref 0.7–4.0)
MCHC: 34 g/dL (ref 30.0–36.0)
MCV: 94.4 fl (ref 78.0–100.0)
Monocytes Absolute: 0.4 K/uL (ref 0.1–1.0)
Monocytes Relative: 7.6 % (ref 3.0–12.0)
Neutro Abs: 3.6 K/uL (ref 1.4–7.7)
Neutrophils Relative %: 61.8 % (ref 43.0–77.0)
Platelets: 226 K/uL (ref 150.0–400.0)
RBC: 4.21 Mil/uL — ABNORMAL LOW (ref 4.22–5.81)
RDW: 13.6 % (ref 11.5–15.5)
WBC: 5.8 K/uL (ref 4.0–10.5)

## 2024-03-05 LAB — VITAMIN B12: Vitamin B-12: 177 pg/mL — ABNORMAL LOW (ref 211–911)

## 2024-03-05 LAB — PSA: PSA: 0.31 ng/mL (ref 0.10–4.00)

## 2024-03-05 LAB — HEMOGLOBIN A1C: Hgb A1c MFr Bld: 6.2 % (ref 4.6–6.5)

## 2024-03-05 MED ORDER — LOSARTAN POTASSIUM 100 MG PO TABS: 100.0000 mg | ORAL_TABLET | Freq: Every day | ORAL | 3 refills | Status: AC

## 2024-03-05 MED ORDER — LOSARTAN POTASSIUM 50 MG PO TABS: ORAL_TABLET | ORAL | 4 refills | Status: DC

## 2024-03-05 MED ORDER — OMEPRAZOLE 20 MG PO CPDR: 20.0000 mg | DELAYED_RELEASE_CAPSULE | Freq: Every day | ORAL | 3 refills | Status: AC

## 2024-03-05 MED ORDER — ATORVASTATIN CALCIUM 80 MG PO TABS: 80.0000 mg | ORAL_TABLET | ORAL | 3 refills | Status: AC

## 2024-03-05 MED ORDER — TIRZEPATIDE-WEIGHT MANAGEMENT 5 MG/0.5ML ~~LOC~~ SOLN
5.0000 mg | SUBCUTANEOUS | 1 refills | Status: DC
Start: 2024-04-02 — End: 2024-04-16

## 2024-03-05 MED ORDER — ATORVASTATIN CALCIUM 80 MG PO TABS
80.0000 mg | ORAL_TABLET | ORAL | 4 refills | Status: DC
Start: 2024-03-05 — End: 2024-03-05

## 2024-03-05 MED ORDER — DILTIAZEM HCL ER COATED BEADS 180 MG PO CP24: 180.0000 mg | ORAL_CAPSULE | Freq: Every day | ORAL | 6 refills | Status: AC

## 2024-03-05 MED ORDER — TIRZEPATIDE-WEIGHT MANAGEMENT 2.5 MG/0.5ML ~~LOC~~ SOLN: 2.5000 mg | SUBCUTANEOUS | 0 refills | Status: DC

## 2024-03-05 NOTE — Assessment & Plan Note (Signed)
 Chronic, deteriorated. He hasn't yet started higher losartan  dose or diltiazem  per last cardiology note Rec start higher losartan , I have also sent diltiazem  CD 180mg  to pharmacy to take daily DASH diet handout provided Rec buy BP arm cuff to check at home, BP log provided today.

## 2024-03-05 NOTE — Assessment & Plan Note (Signed)
Chronic, stable on omeprazole 20mg  daily - continue.

## 2024-03-05 NOTE — Patient Instructions (Addendum)
 BP was too high. Increase losartan  to 100mg  daily - double up on 50mg  tablets until you run out.  Also start diltiazem  daily 2nd medicine for blood pressure Buy BP arm cuff to keep track at home, monitor BP at home.  Work on cutting down on alcohol intake.  May try zepbound  sent to mail order pharmacy [facility]). Take 2.5mg  shots weekly for the first month then increase to 5mg  weekly.  Return in 6-8 weeks after starting medicine.   Check DASH diet handout provided today

## 2024-03-05 NOTE — Assessment & Plan Note (Signed)
 Encouraged healthy diet and lifestyle to affect sustainable weight loss. Patient is interested in GLP1RA/GIP medication. Reviewed mechanism of action of medication as well as side effects and adverse events to watch for including nausea, diarrhea, constipation, pancreatitis. No fmhx medullary thyroid  cancer or MEN2. Discussed titration schedule for medication. Will start tirzepatide  2.5mg  x1 month then increase to 5mg  weekly. Vials ordered through [employer].  Discussed need for regular visits for weight management to monitor medication effect and tolerance and weight loss, rec return 6-8 weeks after starting medication.

## 2024-03-05 NOTE — Assessment & Plan Note (Signed)
 Update A1c ?

## 2024-03-05 NOTE — Assessment & Plan Note (Signed)
 Preventative protocols reviewed and updated unless pt declined. Discussed healthy diet and lifestyle.

## 2024-03-05 NOTE — Progress Notes (Signed)
 Ph: [phone] Fax: [phone]   Patient ID: [name] [name], male    DOB: 1961-04-07, 63 y.o.   MRN: [id number]  This visit was conducted in person.  BP (!) 180/100   Pulse 78   Temp 98 F (36.7 C) (Oral)   Ht 5' 8 (1.727 m)   Wt 250 lb (113.4 kg)   SpO2 99%   BMI 38.01 kg/m   BP Readings from Last 3 Encounters:  03/05/24 (!) 180/100  03/11/23 (!) 178/72  03/05/23 (!) 165/99  Elevated on repeat   CC: CPE Subjective:   HPI: [name] [name] is a 63 y.o. male presenting on 03/05/2024 for Annual Exam   HTN - continues losartan  daily. No HA, vision changes, CP/tightness, SOB, leg swelling.  Established with [facility] cardiology Dr Custovic - losartan  recently increased to 100mg  daily - he's not yet started new dose.  HLD with CAD on atorvastatin  80mg  daily.    H/o R knee  replacement Debby) 02/2022 followed by R hip replacement 07/2022.   Preventative: COLONOSCOPY WITH PROPOFOL             N/A      10/04/2021      TAx2, low grade dysplasia present, hemorrhoids, diverticulosis, rpt 3 yrs (Vanga, Corinn Skiff, MD)         COLONOSCOPY 10/06/2021 done for rectal bleed after polypectomy s/p clip, rec rpt colonoscopy 6 months to remove polyp seen ascending colon ([place])   COLONOSCOPY WITH PROPOFOL  07/22/2022 - TAs, int hem, rpt 3 yrs Renne, Darren, MD) Prostate screening - discussed. Nocturia x1.  Lung cancer screening - not eligible Flu shot - declines Covid vaccine - J&J 06/2020, J&J booster x1 Td 01/2018 Pneumococcal - declines Shingrix - declines at this time Seat belt use discussed Sunscreen use discussed. No changing moles on skin. Sees derm  Non smoker  Alcohol - half gallon whiskey q10d - 4 shots /night - increase from prior, attributes to stress. 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 Interim medical history since our last visit reviewed. Allergies and medications reviewed and updated. Outpatient Medications Prior to Visit  Medication Sig Dispense Refill   celecoxib (CELEBREX) 200 MG capsule Take 200 mg by mouth.     atorvastatin  (LIPITOR) 80 MG tablet Take 80 mg by mouth every morning.     losartan  (COZAAR ) 50 MG tablet Take 1 tablet 50mg  by mouth daily for blood pressure (Patient taking differently: Take 50 mg by mouth every morning. Take 1 tablet 50mg  by mouth daily for blood pressure) 90 tablet 4   omeprazole  (PRILOSEC) 20 MG capsule Take 1 capsule (20 mg total) by mouth daily. (Patient taking differently: Take 20 mg by mouth every morning.) 90 capsule 4   ketorolac  (TORADOL ) 10 MG tablet Take 1 tablet (10 mg total) by mouth every 6 (six) hours as needed. 20 tablet 0   oxybutynin  (DITROPAN ) 5 MG tablet 1 tab tid prn frequency,urgency, bladder spasm 15 tablet 0   oxyCODONE -acetaminophen  (PERCOCET/ROXICET) 5-325 MG tablet Take 1 tablet by mouth every 6 (six) hours as needed for severe pain. 5 tablet 0   No facility-administered medications prior to visit.  Per HPI unless specifically indicated in ROS section below Review of Systems  Constitutional:  Negative for activity change, appetite change, chills, fatigue, fever and unexpected weight change.  HENT:  Negative for hearing loss.   Eyes:  Negative for visual disturbance.  Respiratory:  Negative for cough, chest tightness, shortness of breath and wheezing.   Cardiovascular:  Positive for palpitations (occ). Negative for chest pain and leg swelling.  Gastrointestinal:  Negative for abdominal distention, abdominal pain, blood in stool, constipation, diarrhea, nausea and vomiting.  Genitourinary:  Negative for difficulty urinating and hematuria.  Musculoskeletal:  Negative for arthralgias, myalgias  and neck pain.  Skin:  Negative for rash.  Neurological:  Negative for dizziness, seizures, syncope and headaches.  Hematological:  Negative for adenopathy. Does not bruise/bleed easily.  Psychiatric/Behavioral:  Negative for dysphoric mood. The patient is not nervous/anxious.     Objective:  BP (!) 180/100   Pulse 78   Temp 98 F (36.7 C) (Oral)   Ht 5' 8 (1.727 m)   Wt 250 lb (113.4 kg)   SpO2 99%   BMI 38.01 kg/m   Wt Readings from Last 3 Encounters:  03/05/24 250 lb (113.4 kg)  03/11/23 240 lb (108.9 kg)  03/05/23 250 lb (113.4 kg)      Physical Exam Vitals and nursing note reviewed.  Constitutional:      General: He is not in acute distress.    Appearance: Normal appearance. He is well-developed. He is not ill-appearing.  HENT:     Head: Normocephalic and atraumatic.     Right Ear: Hearing, tympanic membrane, ear canal and external ear normal.     Left Ear: Hearing, tympanic membrane, ear canal and external ear normal.     Mouth/Throat:     Mouth: Mucous membranes are moist.     Pharynx: Oropharynx is clear. No oropharyngeal exudate or posterior oropharyngeal erythema.  Eyes:     General: No scleral icterus.    Extraocular Movements: Extraocular movements intact.     Conjunctiva/sclera: Conjunctivae normal.     Pupils: Pupils are equal, round, and reactive to light.  Neck:     Thyroid : No thyroid  mass or thyromegaly.     Vascular: No carotid bruit.  Cardiovascular:     Rate and Rhythm: Normal rate and regular rhythm.     Pulses: Normal pulses.          Radial pulses are 2+ on the right side and 2+ on the left side.     Heart sounds: Normal heart sounds. No murmur heard. Pulmonary:     Effort: Pulmonary effort is normal. No respiratory distress.     Breath sounds: Normal breath sounds. No wheezing, rhonchi or rales.  Abdominal:     General: Bowel sounds are normal. There is no distension.     Palpations: Abdomen is soft. There is no mass.     Tenderness:  There is no abdominal tenderness. There is no guarding or rebound.     Hernia: No hernia is present.  Musculoskeletal:        General: Normal range of motion.     Cervical back: Normal range of motion and neck supple.     Right lower leg: No edema.     Left lower leg: No edema.  Lymphadenopathy:     Cervical: No cervical adenopathy.  Skin:    General: Skin is warm and dry.     Findings: No rash.  Neurological:     General: No focal  deficit present.     Mental Status: He is alert and oriented to person, place, and time.  Psychiatric:        Mood and Affect: Mood normal.        Behavior: Behavior normal.        Thought Content: Thought content normal.        Judgment: Judgment normal.       Results for orders placed or performed during the hospital encounter of 03/11/23  Calculi, with Photograph (to Clinical Lab)   Collection Time: 03/11/23 10:04 AM  Result Value Ref Range   Source Comment    Color Calculi Brown    Size Calculi 3x2 mm   Weight Calculi 7.0 mg   Composition Calculi Comment    Calcium  Oxalate Monohydrate 10 %   Calcium  Oxalate Dihydrate 50 %   Calcium  Phosphate (Hydroxyl) 40 %   Comment Calculi 1 Comment    Calculus Received Wet Comment    Photo Calculi Comment    Comment Calculi 3 Comment    Please Note: Comment    DISCLAIMER: Comment    Lab Results  Component Value Date   PSA 0.21 08/07/2021   PSA 0.2 10/08/2019   PSA 0.25 02/13/2018   Assessment & Plan:   Problem List Items Addressed This Visit     Encounter for general adult medical examination with abnormal findings - Primary (Chronic)   Preventative protocols reviewed and updated unless pt declined. Discussed healthy diet and lifestyle.       GERD (gastroesophageal reflux disease)   Chronic, stable on omeprazole  20mg  daily - continue.       Relevant Medications   omeprazole  (PRILOSEC) 20 MG capsule   CAD (coronary artery disease)   Appreciate cardiology care      Relevant  Medications   atorvastatin  (LIPITOR) 80 MG tablet   losartan  (COZAAR ) 100 MG tablet   diltiazem  (CARDIZEM  CD) 180 MG 24 hr capsule   Dyslipidemia   Chronic, on high potency statin. Update FLP  In fmhx , check Lp(a) The 10-year ASCVD risk score (Arnett DK, et al., 2019) is: 16.9%   Values used to calculate the score:     Age: 19 years     Clincally relevant sex: Male     Is Non-Hispanic African American: No     Diabetic: No     Tobacco smoker: No     Systolic Blood Pressure: 180 mmHg     Is BP treated: Yes     HDL Cholesterol: 37.1 mg/dL     Total Cholesterol: 135 mg/dL       Relevant Medications   atorvastatin  (LIPITOR) 80 MG tablet   Hypertension, essential   Chronic, deteriorated. He hasn't yet started higher losartan  dose or diltiazem  per last cardiology note Rec start higher losartan , I have also sent diltiazem  CD 180mg  to pharmacy to take daily DASH diet handout provided Rec buy BP arm cuff to check at home, BP log provided today.       Relevant Medications   atorvastatin  (LIPITOR) 80 MG tablet   losartan  (COZAAR ) 100 MG tablet   diltiazem  (CARDIZEM  CD) 180 MG 24 hr capsule   Severe obesity (BMI 35.0-39.9) with comorbidity (HCC)   Encouraged healthy diet and lifestyle to affect sustainable weight loss. Patient is interested in GLP1RA/GIP medication. Reviewed mechanism of action of medication as well as side effects and adverse events to watch for including nausea, diarrhea, constipation, pancreatitis. No fmhx medullary thyroid  cancer or MEN2. Discussed  titration schedule for medication. Will start tirzepatide  2.5mg  x1 month then increase to 5mg  weekly. Vials ordered through [employer].  Discussed need for regular visits for weight management to monitor medication effect and tolerance and weight loss, rec return 6-8 weeks after starting medication.        Relevant Medications   tirzepatide  5 MG/0.5ML injection vial (Start on 04/02/2024)   tirzepatide  (ZEPBOUND )  2.5 MG/0.5ML injection vial   Habitual alcohol use   Discussed alcohol use - 1/2 gallon of whiskey evey 10 days Reviewed detrimental effect on health, encouraged cutting down/cessation.  Update vitamin levels in habital alcohol use.       Relevant Orders   CBC with Differential/Platelet   Vitamin B12   Vitamin B1   Folate   Prediabetes   Update A1c.       Family history of early CAD   Other Visit Diagnoses       Special screening for malignant neoplasm of prostate            Meds ordered this encounter  Medications   omeprazole  (PRILOSEC) 20 MG capsule    Sig: Take 1 capsule (20 mg total) by mouth daily.    Dispense:  90 capsule    Refill:  3   DISCONTD: atorvastatin  (LIPITOR) 80 MG tablet    Sig: Take 1 tablet (80 mg total) by mouth every morning.    Dispense:  90 tablet    Refill:  4   DISCONTD: losartan  (COZAAR ) 50 MG tablet    Sig: Take 1 tablet 50mg  by mouth daily for blood pressure    Dispense:  90 tablet    Refill:  4   atorvastatin  (LIPITOR) 80 MG tablet    Sig: Take 1 tablet (80 mg total) by mouth every morning.    Dispense:  90 tablet    Refill:  3   losartan  (COZAAR ) 100 MG tablet    Sig: Take 1 tablet (100 mg total) by mouth daily. Take 1 tablet 50mg  by mouth daily for blood pressure    Dispense:  90 tablet    Refill:  3    Use this sig - per cardiology   tirzepatide  5 MG/0.5ML injection vial    Sig: Inject 5 mg into the skin once a week.    Dispense:  2 mL    Refill:  1   tirzepatide  (ZEPBOUND ) 2.5 MG/0.5ML injection vial    Sig: Inject 2.5 mg into the skin once a week.    Dispense:  2 mL    Refill:  0   diltiazem  (CARDIZEM  CD) 180 MG 24 hr capsule    Sig: Take 1 capsule (180 mg total) by mouth daily.    Dispense:  30 capsule    Refill:  6    Orders Placed This Encounter  Procedures   CBC with Differential/Platelet   Vitamin B12   Vitamin B1   Folate    Patient Instructions  BP was too high. Increase losartan  to 100mg  daily - double  up on 50mg  tablets until you run out.  Also start diltiazem  daily 2nd medicine for blood pressure Buy BP arm cuff to keep track at home, monitor BP at home.  Work on cutting down on alcohol intake.  May try zepbound  sent to mail order pharmacy [facility]). Take 2.5mg  shots weekly for the first month then increase to 5mg  weekly.  Return in 6-8 weeks after starting medicine.   Check DASH diet handout provided today  Follow up plan: Return in about 6 weeks (around 04/16/2024) for follow up visit.  Anton Blas, MD

## 2024-03-05 NOTE — Assessment & Plan Note (Signed)
Appreciate cardiology care.  °

## 2024-03-05 NOTE — Assessment & Plan Note (Signed)
 Discussed alcohol use - 1/2 gallon of whiskey evey 10 days Reviewed detrimental effect on health, encouraged cutting down/cessation.  Update vitamin levels in habital alcohol use.

## 2024-03-05 NOTE — Assessment & Plan Note (Signed)
 Chronic, on high potency statin. Update FLP  In fmhx , check Lp(a) The 10-year ASCVD risk score (Arnett DK, et al., 2019) is: 16.9%   Values used to calculate the score:     Age: 63 years     Clincally relevant sex: Male     Is Non-Hispanic African American: No     Diabetic: No     Tobacco smoker: No     Systolic Blood Pressure: 180 mmHg     Is BP treated: Yes     HDL Cholesterol: 37.1 mg/dL     Total Cholesterol: 135 mg/dL

## 2024-03-08 ENCOUNTER — Ambulatory Visit: Payer: Self-pay | Admitting: Family Medicine

## 2024-03-08 DIAGNOSIS — E785 Hyperlipidemia, unspecified: Secondary | ICD-10-CM

## 2024-03-08 MED ORDER — VITAMIN B-12 1000 MCG PO TABS: 1000.0000 ug | ORAL_TABLET | Freq: Every day | ORAL | Status: AC

## 2024-03-09 LAB — VITAMIN B1: Vitamin B1 (Thiamine): 9 nmol/L (ref 8–30)

## 2024-03-09 LAB — LIPOPROTEIN A (LPA): Lipoprotein (a): 10 nmol/L (ref <75)

## 2024-03-22 ENCOUNTER — Other Ambulatory Visit: Payer: Self-pay | Admitting: Family Medicine

## 2024-04-16 ENCOUNTER — Encounter: Payer: Self-pay | Admitting: Family Medicine

## 2024-04-16 ENCOUNTER — Ambulatory Visit (INDEPENDENT_AMBULATORY_CARE_PROVIDER_SITE_OTHER): Admitting: Family Medicine

## 2024-04-16 DIAGNOSIS — E538 Deficiency of other specified B group vitamins: Secondary | ICD-10-CM | POA: Diagnosis not present

## 2024-04-16 DIAGNOSIS — I10 Essential (primary) hypertension: Secondary | ICD-10-CM

## 2024-04-16 MED ORDER — TIRZEPATIDE-WEIGHT MANAGEMENT 5 MG/0.5ML ~~LOC~~ SOLN: 5.0000 mg | SUBCUTANEOUS | 2 refills | Status: DC

## 2024-04-16 NOTE — Assessment & Plan Note (Addendum)
 Tolerating Zepbound  5mg  weekly well without significant adverse effects - and effective to decrease decreased appetite. Has also essentially stopped alcohol.  Discussed protein with each meal and importance of incorporating strength training into routine to prevent muscle loss.  Continue 5mg  dose, RTC 3 mo weight management visit.

## 2024-04-16 NOTE — Progress Notes (Signed)
 Ph: [phone] Fax: [phone]   Patient ID: [name] [name], male    DOB: 01/30/1961, 63 y.o.   MRN: [id number]  This visit was conducted in person.  BP 122/82   Pulse 81   Temp 97.7 F (36.5 C) (Oral)   Ht 5' 8 (1.727 m)   Wt 230 lb 8 oz (104.6 kg)   SpO2 98%   BMI 35.05 kg/m   BP Readings from Last 3 Encounters:  04/16/24 122/82  03/05/24 (!) 180/100  03/11/23 (!) 178/72    CC: HTN f/u visit  Subjective:   HPI: [name] [name] is a 63 y.o. male presenting on 04/16/2024 for Medical Management of Chronic Issues (Pt here for f/u HTN)   HTN - Compliant with current antihypertensive regimen of losartan  100mg  daily, cardizem  CD 180mg  daily. Latest hydrochlorothiazide 25mg  daily started by cardiology, he's not been taking losartan . Does check blood pressures at home: this month 118-160/70-100.  No low blood pressure readings or symptoms of dizziness/syncope. Denies HA, vision changes, CP/tightness, SOB, leg swelling.   Now seeing Dr Brook Dr Wilburn with [facility] for mild non-obstructive CAD and HTN.   Starting weight: 250 lbs Last weight: 250 lbs Today's weight 230 lbs  Zepbound  started 02/2024 - tolerating well. Doing vials through mail order pharmacy. Tolerating well without nausea, constipation, diarrhea, epigastric pain. No neck swelling noted. Notes decreased appetite. Has cut down on snacking - occ granola bar. Has stopped whiskey.   Rarely works 3rd shift - worked this last night. Got off at 6am this morning.  Normally shift is 6am-4:30pm.   24 hour recall: Breaskfast - skipped  11pm lunch - enchiladas, water   5:30pm dinner - chicken wings, water    Activity regimen: Stays busy at job.  Discussed incorporating strength training into routine.        Relevant past medical, surgical, family and social history reviewed and updated as indicated. Interim medical history since our last visit reviewed. Allergies and medications reviewed and  updated. Outpatient Medications Prior to Visit  Medication Sig Dispense Refill   atorvastatin  (LIPITOR) 80 MG tablet Take 1 tablet (80 mg total) by mouth every morning. 90 tablet 3   celecoxib (CELEBREX) 200 MG capsule Take 200 mg by mouth.     cyanocobalamin  (VITAMIN B12) 1000 MCG tablet Take 1 tablet (1,000 mcg total) by mouth daily.     diltiazem  (CARDIZEM  CD) 180 MG 24 hr capsule Take 1 capsule (180 mg total) by mouth daily. 30 capsule 6   hydrochlorothiazide (HYDRODIURIL) 25 MG tablet Take 25 mg by mouth daily.     losartan  (COZAAR ) 100 MG tablet Take 1 tablet (100 mg total) by mouth daily. Take 1 tablet 50mg  by mouth daily for blood pressure 90 tablet 3   omeprazole  (PRILOSEC) 20 MG capsule Take 1 capsule (20 mg total) by mouth daily. 90 capsule 3   tirzepatide  (ZEPBOUND ) 2.5 MG/0.5ML injection vial Inject 2.5 mg into the skin once a week. 2 mL 0   tirzepatide  5 MG/0.5ML injection vial Inject 5 mg into the skin once a week. 2 mL 1   No facility-administered medications prior to visit.     Per HPI unless specifically indicated in ROS section below Review of Systems  Objective:  BP 122/82   Pulse 81   Temp 97.7 F (36.5 C) (Oral)   Ht 5' 8 (1.727 m)   Wt 230 lb 8 oz (104.6 kg)   SpO2 98%   BMI 35.05 kg/m  Wt Readings from Last 3 Encounters:  04/16/24 230 lb 8 oz (104.6 kg)  03/05/24 250 lb (113.4 kg)  03/11/23 240 lb (108.9 kg)      Physical Exam Vitals and nursing note reviewed.  Constitutional:      Appearance: Normal appearance. He is not ill-appearing.  HENT:     Head: Normocephalic and atraumatic.     Mouth/Throat:     Mouth: Mucous membranes are moist.     Pharynx: Oropharynx is clear. No oropharyngeal exudate or posterior oropharyngeal erythema.  Eyes:     Extraocular Movements: Extraocular movements intact.     Conjunctiva/sclera: Conjunctivae normal.     Pupils: Pupils are equal, round, and reactive to light.  Neck:     Thyroid : No thyroid  mass or  thyromegaly.  Cardiovascular:     Rate and Rhythm: Normal rate and regular rhythm.     Pulses: Normal pulses.     Heart sounds: Normal heart sounds. No murmur heard. Pulmonary:     Effort: Pulmonary effort is normal. No respiratory distress.     Breath sounds: Normal breath sounds. No wheezing or rales.  Abdominal:     General: Bowel sounds are normal. There is no distension.     Palpations: Abdomen is soft. There is no mass.     Tenderness: There is no abdominal tenderness. There is no right CVA tenderness, left CVA tenderness, guarding or rebound.     Hernia: No hernia is present.  Musculoskeletal:     Cervical back: Normal range of motion and neck supple.     Right lower leg: No edema.     Left lower leg: No edema.  Skin:    General: Skin is warm and dry.     Findings: No rash.  Neurological:     Mental Status: He is alert.  Psychiatric:        Mood and Affect: Mood normal.        Behavior: Behavior normal.       Results for orders placed or performed in visit on 03/05/24  Lipoprotein A (LPA)   Collection Time: 03/05/24  9:42 AM  Result Value Ref Range   Lipoprotein (a) 10 <75 nmol/L  PSA   Collection Time: 03/05/24  9:42 AM  Result Value Ref Range   PSA 0.31 0.10 - 4.00 ng/mL  Hemoglobin A1c   Collection Time: 03/05/24  9:42 AM  Result Value Ref Range   Hgb A1c MFr Bld 6.2 4.6 - 6.5 %  Comprehensive metabolic panel with GFR   Collection Time: 03/05/24  9:42 AM  Result Value Ref Range   Sodium 141 135 - 145 mEq/L   Potassium 4.0 3.5 - 5.1 mEq/L   Chloride 104 96 - 112 mEq/L   CO2 31 19 - 32 mEq/L   Glucose, Bld 96 70 - 99 mg/dL   BUN 15 6 - 23 mg/dL   Creatinine, Ser 9.13 0.40 - 1.50 mg/dL   Total Bilirubin 0.7 0.2 - 1.2 mg/dL   Alkaline Phosphatase 112 39 - 117 U/L   AST 20 0 - 37 U/L   ALT 24 0 - 53 U/L   Total Protein 6.4 6.0 - 8.3 g/dL   Albumin 4.2 3.5 - 5.2 g/dL   GFR 06.70 >39.99 mL/min   Calcium  9.3 8.4 - 10.5 mg/dL  Lipid panel   Collection  Time: 03/05/24  9:42 AM  Result Value Ref Range   Cholesterol 139 0 - 200 mg/dL   Triglycerides 10.9 0.0 -  149.0 mg/dL   HDL 62.39 (L) >60.99 mg/dL   VLDL 82.1 0.0 - 59.9 mg/dL   LDL Cholesterol 84 0 - 99 mg/dL   Total CHOL/HDL Ratio 4    NonHDL 101.43   CBC with Differential/Platelet   Collection Time: 03/05/24  9:42 AM  Result Value Ref Range   WBC 5.8 4.0 - 10.5 K/uL   RBC 4.21 (L) 4.22 - 5.81 Mil/uL   Hemoglobin 13.5 13.0 - 17.0 g/dL   HCT 60.1 60.9 - 47.9 %   MCV 94.4 78.0 - 100.0 fl   MCHC 34.0 30.0 - 36.0 g/dL   RDW 86.3 88.4 - 84.4 %   Platelets 226.0 150.0 - 400.0 K/uL   Neutrophils Relative % 61.8 43.0 - 77.0 %   Lymphocytes Relative 27.5 12.0 - 46.0 %   Monocytes Relative 7.6 3.0 - 12.0 %   Eosinophils Relative 2.5 0.0 - 5.0 %   Basophils Relative 0.6 0.0 - 3.0 %   Neutro Abs 3.6 1.4 - 7.7 K/uL   Lymphs Abs 1.6 0.7 - 4.0 K/uL   Monocytes Absolute 0.4 0.1 - 1.0 K/uL   Eosinophils Absolute 0.1 0.0 - 0.7 K/uL   Basophils Absolute 0.0 0.0 - 0.1 K/uL  Vitamin B12   Collection Time: 03/05/24  9:42 AM  Result Value Ref Range   Vitamin B-12 177 (L) 211 - 911 pg/mL  Folate   Collection Time: 03/05/24  9:42 AM  Result Value Ref Range   Folate 12.4 >5.9 ng/mL  Vitamin B1   Collection Time: 03/05/24  9:45 AM  Result Value Ref Range   Vitamin B1 (Thiamine ) 9 8 - 30 nmol/L    Assessment & Plan:   Problem List Items Addressed This Visit     Hypertension, essential   Chronic, significant improvement.  He's only been taking hydrochlorothiazide and diltiazem . Encouraged add losartan  100mg  daily, continue to monitor BP and if hypotension or hypotensive symptoms develop then hold hydrochlorothiazide and notify cardiology. Pt agrees with plan.       Relevant Medications   hydrochlorothiazide (HYDRODIURIL) 25 MG tablet   Severe obesity (BMI 35.0-39.9) with comorbidity (HCC) - Primary   Tolerating Zepbound  5mg  weekly well without significant adverse effects - and effective  to decrease decreased appetite. Has also essentially stopped alcohol.  Discussed protein with each meal and importance of incorporating strength training into routine to prevent muscle loss.  Continue 5mg  dose, RTC 3 mo weight management visit.       Relevant Medications   tirzepatide  5 MG/0.5ML injection vial   Vitamin B12 deficiency   Continue vit B12 1000mcg daily.         Meds ordered this encounter  Medications   tirzepatide  5 MG/0.5ML injection vial    Sig: Inject 5 mg into the skin once a week.    Dispense:  2 mL    Refill:  2    No orders of the defined types were placed in this encounter.   Patient Instructions  Your BP regimen is 3 drugs: losartan , hydrochlorothiazide ,diltiazem   Monitor BP and if dropping then would hold hydrochlorothiazide. Notify cardiology.  Congrats on weight loss!  Continue Zepbound  5mg  weekly. Incorporate strength training into routine.  Schedule follow up visit in 3 months  Follow up plan: Return in about 3 months (around 07/17/2024) for follow up visit.  Anton Blas, MD

## 2024-04-16 NOTE — Assessment & Plan Note (Signed)
 Chronic, significant improvement.  He's only been taking hydrochlorothiazide and diltiazem . Encouraged add losartan  100mg  daily, continue to monitor BP and if hypotension or hypotensive symptoms develop then hold hydrochlorothiazide and notify cardiology. Pt agrees with plan.

## 2024-04-16 NOTE — Patient Instructions (Addendum)
 Your BP regimen is 3 drugs: losartan , hydrochlorothiazide ,diltiazem   Monitor BP and if dropping then would hold hydrochlorothiazide. Notify cardiology.  Congrats on weight loss!  Continue Zepbound  5mg  weekly. Incorporate strength training into routine.  Schedule follow up visit in 3 months

## 2024-04-16 NOTE — Assessment & Plan Note (Addendum)
 Continue vit B12 daily.

## 2024-06-07 ENCOUNTER — Other Ambulatory Visit: Payer: Self-pay | Admitting: Family Medicine

## 2024-06-08 NOTE — Telephone Encounter (Signed)
 Zepbound  Last filled:  05/18/24, #2 mL Last OV:  04/16/24, HTN f/u Next OV:  none

## 2024-09-09 ENCOUNTER — Other Ambulatory Visit: Payer: Self-pay | Admitting: Family Medicine

## 2024-09-11 NOTE — Telephone Encounter (Signed)
 ERx Plz schedule weight management f/u visit

## 2024-09-24 ENCOUNTER — Ambulatory Visit: Admitting: Family Medicine

## 2024-10-05 ENCOUNTER — Ambulatory Visit: Admitting: Family Medicine
